# Patient Record
Sex: Male | Born: 1950 | Race: White | Hispanic: No | Marital: Married | Smoking: Never smoker
Health system: Southern US, Community
[De-identification: ages and names within clinical notes are randomized; demographics above are authoritative.]

## PROBLEM LIST (undated history)

## (undated) DIAGNOSIS — C44300 Unspecified malignant neoplasm of skin of unspecified part of face: Secondary | ICD-10-CM

## (undated) DIAGNOSIS — I10 Essential (primary) hypertension: Secondary | ICD-10-CM

## (undated) DIAGNOSIS — I5022 Chronic systolic (congestive) heart failure: Secondary | ICD-10-CM

## (undated) DIAGNOSIS — I4819 Other persistent atrial fibrillation: Secondary | ICD-10-CM

## (undated) DIAGNOSIS — F1010 Alcohol abuse, uncomplicated: Secondary | ICD-10-CM

## (undated) HISTORY — PX: TONSILLECTOMY: SUR1361

## (undated) HISTORY — PX: KNEE ARTHROSCOPY: SHX127

## (undated) HISTORY — DX: Alcohol abuse, uncomplicated: F10.10

## (undated) HISTORY — DX: Chronic systolic (congestive) heart failure: I50.22

## (undated) HISTORY — DX: Essential (primary) hypertension: I10

## (undated) HISTORY — PX: MOHS SURGERY: SUR867

## (undated) HISTORY — DX: Other persistent atrial fibrillation: I48.19

---

## 2013-12-25 LAB — HEPATIC FUNCTION PANEL: ALT: 53 U/L — AB (ref 10–40)

## 2013-12-26 LAB — LIPID PANEL
CHOLESTEROL: 203 mg/dL — AB (ref 0–200)
HDL: 73 mg/dL — AB (ref 35–70)
LDL CALC: 115 mg/dL
LDL/HDL RATIO: 1.6
TRIGLYCERIDES: 77 mg/dL (ref 40–160)

## 2013-12-26 LAB — BASIC METABOLIC PANEL
BUN: 15 mg/dL (ref 4–21)
CREATININE: 1 mg/dL (ref 0.6–1.3)
Glucose: 80 mg/dL
Potassium: 4.3 mmol/L (ref 3.4–5.3)
Sodium: 136 mmol/L — AB (ref 137–147)

## 2013-12-26 LAB — HEPATIC FUNCTION PANEL
AST: 59 U/L — AB (ref 14–40)
Alkaline Phosphatase: 84 U/L (ref 25–125)
Bilirubin, Total: 1.1 mg/dL

## 2014-12-30 DIAGNOSIS — C44310 Basal cell carcinoma of skin of unspecified parts of face: Secondary | ICD-10-CM | POA: Insufficient documentation

## 2015-01-06 DIAGNOSIS — R748 Abnormal levels of other serum enzymes: Secondary | ICD-10-CM | POA: Insufficient documentation

## 2015-01-06 DIAGNOSIS — E7800 Pure hypercholesterolemia, unspecified: Secondary | ICD-10-CM | POA: Insufficient documentation

## 2015-01-06 DIAGNOSIS — I10 Essential (primary) hypertension: Secondary | ICD-10-CM | POA: Insufficient documentation

## 2015-01-13 ENCOUNTER — Encounter: Payer: Self-pay | Admitting: Family Medicine

## 2015-01-13 ENCOUNTER — Ambulatory Visit (INDEPENDENT_AMBULATORY_CARE_PROVIDER_SITE_OTHER): Payer: Managed Care, Other (non HMO) | Admitting: Family Medicine

## 2015-01-13 VITALS — BP 114/58 | HR 76 | Temp 97.7°F | Resp 16 | Wt 187.0 lb

## 2015-01-13 DIAGNOSIS — E785 Hyperlipidemia, unspecified: Secondary | ICD-10-CM | POA: Diagnosis not present

## 2015-01-13 DIAGNOSIS — I159 Secondary hypertension, unspecified: Secondary | ICD-10-CM | POA: Diagnosis not present

## 2015-01-13 NOTE — Progress Notes (Signed)
Patient ID: Patrick Roberts, male   DOB: 1950/05/26, 64 y.o.   MRN: [id number]   Patrick Roberts  MRN: [id number] DOB: [birth year]  Subjective:  HPI   1. Secondary hypertension, unspecified Patient is a 64 year old male who presents for follow up of his hypertension.  He was last seen on 07/14/14 and at that time he was started Amlodipine 5 mg daily.  He reports that he checks his blood pressure outside of the office but does not feel confident with the machines he used.  Patient Active Problem List   Diagnosis Date Noted  . Abnormal liver enzymes 01/06/2015  . Essential (primary) hypertension 01/06/2015  . Pure hypercholesterolemia 01/06/2015    No past medical history on file.  Social History   Social History  . Marital Status: Married    Spouse Name: N/A  . Number of Children: N/A  . Years of Education: N/A   Occupational History  . Not on file.   Social History Main Topics  . Smoking status: Never Smoker   . Smokeless tobacco: Not on file  . Alcohol Use: 4.2 oz/week    7 Standard drinks or equivalent per week  . Drug Use: No  . Sexual Activity: Not on file   Other Topics Concern  . Not on file   Social History Narrative    Outpatient Prescriptions Prior to Visit  Medication Sig Dispense Refill  . amLODipine (NORVASC) 5 MG tablet Take by mouth.    Marland Kitchen aspirin 81 MG tablet Take by mouth.    . losartan (COZAAR) 100 MG tablet Take by mouth.    . Multiple Vitamins-Minerals (CENTRUM SILVER ULTRA MENS) TABS Take by mouth.    . simvastatin (ZOCOR) 10 MG tablet Take by mouth.     No facility-administered medications prior to visit.    No Known Allergies  Review of Systems  Constitutional: Negative for fever, chills and malaise/fatigue.  Respiratory: Negative for cough, hemoptysis, sputum production, shortness of breath and wheezing.   Cardiovascular: Negative for chest pain, palpitations, orthopnea and leg swelling.  Neurological: Negative for dizziness, weakness and  headaches.   Objective:  BP 114/58 mmHg  Pulse 76  Temp(Src) 97.7 F (36.5 C) (Oral)  Resp 16  Wt 187 lb (84.823 kg)  Physical Exam  Constitutional: He is oriented to person, place, and time and well-developed, well-nourished, and in no distress.  HENT:  Head: Normocephalic and atraumatic.  Right Ear: External ear normal.  Left Ear: External ear normal.  Nose: Nose normal.  Mouth/Throat: Oropharynx is clear and moist.  Eyes: Conjunctivae are normal.  Neck: Neck supple.  Cardiovascular: Normal rate, regular rhythm and normal heart sounds.   Pulmonary/Chest: Effort normal and breath sounds normal.  Abdominal: Soft.  Neurological: He is alert and oriented to person, place, and time.  Skin: Skin is warm and dry.  Psychiatric: Mood, memory, affect and judgment normal.    Assessment and Plan :  Secondary hypertension, unspecified HLD Check labs. RTC 6 months for CPE. I have done the exam and reviewed the above chart and it is accurate to the best of my knowledge.  Miguel Aschoff MD [facility] 01/13/2015 11:46 AM

## 2015-01-14 LAB — TSH: TSH: 0.848 u[IU]/mL (ref 0.450–4.500)

## 2015-01-14 LAB — LIPID PANEL WITH LDL/HDL RATIO
Cholesterol, Total: 195 mg/dL (ref 100–199)
HDL: 81 mg/dL (ref >39)
LDL Calculated: 84 mg/dL (ref 0–99)
LDl/HDL Ratio: 1 ratio units (ref 0.0–3.6)
Triglycerides: 152 mg/dL — ABNORMAL HIGH (ref 0–149)
VLDL CHOLESTEROL CAL: 30 mg/dL (ref 5–40)

## 2015-01-14 LAB — CBC WITH DIFFERENTIAL/PLATELET
Basophils Absolute: 0 10*3/uL (ref 0.0–0.2)
Basos: 1 %
EOS (ABSOLUTE): 0 10*3/uL (ref 0.0–0.4)
Eos: 1 %
Hematocrit: 41.8 % (ref 37.5–51.0)
Hemoglobin: 14.3 g/dL (ref 12.6–17.7)
IMMATURE GRANULOCYTES: 0 %
Immature Grans (Abs): 0 10*3/uL (ref 0.0–0.1)
LYMPHS ABS: 2.3 10*3/uL (ref 0.7–3.1)
Lymphs: 37 %
MCH: 32 pg (ref 26.6–33.0)
MCHC: 34.2 g/dL (ref 31.5–35.7)
MCV: 94 fL (ref 79–97)
MONOS ABS: 0.6 10*3/uL (ref 0.1–0.9)
Monocytes: 10 %
NEUTROS PCT: 51 %
Neutrophils Absolute: 3.2 10*3/uL (ref 1.4–7.0)
PLATELETS: 189 10*3/uL (ref 150–379)
RBC: 4.47 x10E6/uL (ref 4.14–5.80)
RDW: 13.6 % (ref 12.3–15.4)
WBC: 6.1 10*3/uL (ref 3.4–10.8)

## 2015-01-14 LAB — COMPREHENSIVE METABOLIC PANEL
A/G RATIO: 1.4 (ref 1.1–2.5)
ALBUMIN: 4.2 g/dL (ref 3.6–4.8)
ALT: 26 IU/L (ref 0–44)
AST: 40 IU/L (ref 0–40)
Alkaline Phosphatase: 97 IU/L (ref 39–117)
BILIRUBIN TOTAL: 0.2 mg/dL (ref 0.0–1.2)
BUN / CREAT RATIO: 11 (ref 10–22)
BUN: 9 mg/dL (ref 8–27)
CALCIUM: 9 mg/dL (ref 8.6–10.2)
CHLORIDE: 104 mmol/L (ref 97–106)
CO2: 25 mmol/L (ref 18–29)
Creatinine, Ser: 0.8 mg/dL (ref 0.76–1.27)
GFR, EST AFRICAN AMERICAN: 108 mL/min/{1.73_m2} (ref >59)
GFR, EST NON AFRICAN AMERICAN: 94 mL/min/{1.73_m2} (ref >59)
GLUCOSE: 103 mg/dL — AB (ref 65–99)
Globulin, Total: 2.9 g/dL (ref 1.5–4.5)
Potassium: 5.2 mmol/L (ref 3.5–5.2)
Sodium: 146 mmol/L — ABNORMAL HIGH (ref 136–144)
TOTAL PROTEIN: 7.1 g/dL (ref 6.0–8.5)

## 2015-01-19 ENCOUNTER — Telehealth: Payer: Self-pay | Admitting: Family Medicine

## 2015-01-19 NOTE — Telephone Encounter (Signed)
Pt returned your call. [phone]  Thanks Con Memos

## 2015-01-20 NOTE — Telephone Encounter (Signed)
Pt advised labs ok on his voicemail-aa

## 2015-01-21 ENCOUNTER — Other Ambulatory Visit: Payer: Self-pay | Admitting: Family Medicine

## 2015-04-07 ENCOUNTER — Encounter: Payer: Self-pay | Admitting: Family Medicine

## 2015-04-07 ENCOUNTER — Ambulatory Visit (INDEPENDENT_AMBULATORY_CARE_PROVIDER_SITE_OTHER): Payer: Medicare Other | Admitting: Family Medicine

## 2015-04-07 VITALS — BP 134/72 | HR 94 | Temp 98.9°F | Resp 16 | Wt 183.0 lb

## 2015-04-07 DIAGNOSIS — M25842 Other specified joint disorders, left hand: Secondary | ICD-10-CM | POA: Diagnosis not present

## 2015-04-07 DIAGNOSIS — T148 Other injury of unspecified body region: Secondary | ICD-10-CM

## 2015-04-07 DIAGNOSIS — M25442 Effusion, left hand: Secondary | ICD-10-CM | POA: Diagnosis not present

## 2015-04-07 DIAGNOSIS — M779 Enthesopathy, unspecified: Secondary | ICD-10-CM | POA: Diagnosis not present

## 2015-04-07 DIAGNOSIS — M19041 Primary osteoarthritis, right hand: Secondary | ICD-10-CM | POA: Diagnosis not present

## 2015-04-07 DIAGNOSIS — T148XXA Other injury of unspecified body region, initial encounter: Secondary | ICD-10-CM

## 2015-04-07 DIAGNOSIS — S6991XA Unspecified injury of right wrist, hand and finger(s), initial encounter: Secondary | ICD-10-CM | POA: Diagnosis not present

## 2015-04-07 NOTE — Progress Notes (Signed)
Patient ID: Patrick Roberts, male   DOB: 1950/07/05, 65 y.o.   MRN: [id number]    Subjective:  HPI Pt is here because this past week end he was working and "jammed" his right middle finger while moving boxes. Then while he was working yesterday he noticed that he picked up a box and this same finger went side ways in about a 45 degree angle in the middle joint area. He reports that he can move this finger and everything without pain but it is swollen.   Prior to Admission medications   Medication Sig Start Date End Date Taking? Authorizing Provider  amLODipine (NORVASC) 5 MG tablet Take by mouth. 07/14/14  Yes Historical Provider, MD  aspirin 81 MG tablet Take by mouth.   Yes Historical Provider, MD  losartan (COZAAR) 100 MG tablet TAKE 1 TABLET, ORAL, DAILY 01/21/15  Yes Jerrol Banana., MD  losartan (COZAAR) 100 MG tablet TAKE 1 TABLET, ORAL, DAILY 01/21/15  Yes Jerrol Banana., MD  Multiple Vitamins-Minerals (CENTRUM SILVER ULTRA MENS) TABS Take by mouth.   Yes Historical Provider, MD  simvastatin (ZOCOR) 10 MG tablet Take by mouth. 07/06/14  Yes Historical Provider, MD    Patient Active Problem List   Diagnosis Date Noted  . Abnormal liver enzymes 01/06/2015  . Essential (primary) hypertension 01/06/2015  . Pure hypercholesterolemia 01/06/2015  . Basal cell carcinoma of face 12/30/2014    History reviewed. No pertinent past medical history.  Social History   Social History  . Marital Status: Married    Spouse Name: N/A  . Number of Children: N/A  . Years of Education: N/A   Occupational History  . Not on file.   Social History Main Topics  . Smoking status: Never Smoker   . Smokeless tobacco: Not on file  . Alcohol Use: 4.2 oz/week    7 Standard drinks or equivalent per week     Comment: occasionally  . Drug Use: No  . Sexual Activity: Not on file   Other Topics Concern  . Not on file   Social History Narrative    No Known Allergies  Review of Systems    Constitutional: Negative.   HENT: Negative.   Eyes: Negative.   Respiratory: Negative.   Cardiovascular: Negative.   Gastrointestinal: Negative.   Genitourinary: Negative.   Musculoskeletal: Negative.   Skin: Negative.   Neurological: Negative.   Endo/Heme/Allergies: Negative.   Psychiatric/Behavioral: Negative.     Immunization History  Administered Date(s) Administered  . Tdap 12/25/2013   Objective:  BP 134/72 mmHg  Pulse 94  Temp(Src) 98.9 F (37.2 C) (Oral)  Resp 16  Wt 183 lb (83.008 kg)  Physical Exam  Constitutional: He is oriented to person, place, and time and well-developed, well-nourished, and in no distress.  Eyes: Conjunctivae and EOM are normal. Pupils are equal, round, and reactive to light.  Neck: Normal range of motion. Neck supple.  Cardiovascular: Normal rate, regular rhythm, normal heart sounds and intact distal pulses.   Pulmonary/Chest: Effort normal and breath sounds normal.  Musculoskeletal: Normal range of motion. He exhibits edema and tenderness.  Mild swelling of right middle finger PIP joint. I cannot elicit any laxity of the joint itself.neurovascular exam of the right hand is normal  Neurological: He is alert and oriented to person, place, and time. He has normal reflexes. Gait normal. GCS score is 15.  Skin: Skin is warm and dry.  Psychiatric: Mood, memory, affect and judgment normal.  Lab Results  Component Value Date   WBC 6.1 01/13/2015   HCT 41.8 01/13/2015   PLT 189 01/13/2015   GLUCOSE 103* 01/13/2015   CHOL 195 01/13/2015   TRIG 152* 01/13/2015   HDL 81 01/13/2015   LDLCALC 84 01/13/2015   TSH 0.848 01/13/2015    CMP     Component Value Date/Time   NA 146* 01/13/2015 1231   K 5.2 01/13/2015 1231   CL 104 01/13/2015 1231   CO2 25 01/13/2015 1231   GLUCOSE 103* 01/13/2015 1231   BUN 9 01/13/2015 1231   CREATININE 0.80 01/13/2015 1231   CREATININE 1.0 12/26/2013   CALCIUM 9.0 01/13/2015 1231   PROT 7.1 01/13/2015  1231   ALBUMIN 4.2 01/13/2015 1231   AST 40 01/13/2015 1231   ALT 26 01/13/2015 1231   ALKPHOS 97 01/13/2015 1231   BILITOT 0.2 01/13/2015 1231   GFRNONAA 94 01/13/2015 1231   GFRAA 108 01/13/2015 1231    Assessment and Plan :  1. Finger injury, right, initial encounter  - DG Finger Middle Right; Future  2. Torn ligament most likely etiology of injury patient describes In right middle finger Offered referral to orthopedics. Patient declines presently. Will split her next due index finger for a few days. X-ray to make sure is no fracture in the joint itself.  Patient was seen and examined by Dr. Miguel Aschoff, and noted scribed by Webb Laws, [place] MD [facility] 04/07/2015 2:59 PM

## 2015-04-22 DIAGNOSIS — D649 Anemia, unspecified: Secondary | ICD-10-CM | POA: Diagnosis not present

## 2015-04-30 DIAGNOSIS — D649 Anemia, unspecified: Secondary | ICD-10-CM | POA: Diagnosis not present

## 2015-04-30 DIAGNOSIS — M6281 Muscle weakness (generalized): Secondary | ICD-10-CM | POA: Diagnosis not present

## 2015-05-05 ENCOUNTER — Encounter: Payer: Self-pay | Admitting: Family Medicine

## 2015-07-10 ENCOUNTER — Other Ambulatory Visit: Payer: Self-pay | Admitting: Family Medicine

## 2015-07-13 ENCOUNTER — Encounter: Payer: Managed Care, Other (non HMO) | Admitting: Family Medicine

## 2015-07-19 ENCOUNTER — Encounter: Payer: Self-pay | Admitting: Family Medicine

## 2015-07-19 ENCOUNTER — Ambulatory Visit (INDEPENDENT_AMBULATORY_CARE_PROVIDER_SITE_OTHER): Payer: Medicare Other | Admitting: Family Medicine

## 2015-07-19 VITALS — BP 116/70 | HR 76 | Temp 97.4°F | Resp 16 | Wt 185.0 lb

## 2015-07-19 DIAGNOSIS — M7541 Impingement syndrome of right shoulder: Secondary | ICD-10-CM

## 2015-07-19 NOTE — Progress Notes (Signed)
Patient ID: Patrick Roberts, male   DOB: [birth year], 65 y.o.   MRN: [id number]    Subjective:  HPI Pt is here today to have a form filled out for a pre employment evaluation. 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 With flexion, abduction and external notation in 2/5 to 3+/5 range. He was unable to actively evaluate right shoulder in flexion or abduction above 70 degrees. Pt reports that he does not have pain in either shoulder. He does have ROM issues in the right shoulder but pain is not what restricts his shoulder. He reports that he has worked at [employer] for years and lifted heavy boxes and "slinging"  Heavy boxes with no problems.    Prior to Admission medications   Medication Sig Start Date End Date Taking? Authorizing Provider  amLODipine (NORVASC) 5 MG tablet Take by mouth. 07/14/14  Yes Historical Provider, MD  aspirin 81 MG tablet Take by mouth.   Yes Historical Provider, MD  losartan (COZAAR) 100 MG tablet TAKE 1 TABLET, ORAL, DAILY 01/21/15  Yes Jerrol Banana., MD  losartan (COZAAR) 100 MG tablet TAKE 1 TABLET, ORAL, DAILY 01/21/15  Yes Jerrol Banana., MD  Multiple Vitamins-Minerals (CENTRUM SILVER ULTRA MENS) TABS Take by mouth.   Yes Historical Provider, MD  simvastatin (ZOCOR) 10 MG tablet TAKE 1 TABLET BY MOUTH DAILY 07/13/15  Yes Jerrol Banana., MD    Patient Active Problem List   Diagnosis Date Noted  . Abnormal liver enzymes 01/06/2015  . Essential (primary) hypertension 01/06/2015  . Pure hypercholesterolemia 01/06/2015  . Basal cell carcinoma of face 12/30/2014    History reviewed. No pertinent past medical history.  Social History   Social History  . Marital Status: Married    Spouse Name: N/A  . Number of Children: N/A  . Years of Education: N/A   Occupational History  . Not on file.   Social History Main Topics  . Smoking status: Never Smoker     . Smokeless tobacco: Not on file  . Alcohol Use: 4.2 oz/week    7 Standard drinks or equivalent per week     Comment: occasionally  . Drug Use: No  . Sexual Activity: Not on file   Other Topics Concern  . Not on file   Social History Narrative    No Known Allergies  Review of Systems  Constitutional: Negative.   HENT: Negative.   Eyes: Negative.   Cardiovascular: Negative.   Gastrointestinal: Negative.   Genitourinary: Negative.   Musculoskeletal: Negative.        Decrease ROM in shoulders  Skin: Negative.   Neurological: Negative.   Endo/Heme/Allergies: Negative.   Psychiatric/Behavioral: Negative.     Immunization History  Administered Date(s) Administered  . Tdap 12/25/2013   Objective:  BP 116/70 mmHg  Pulse 76  Temp(Src) 97.4 F (36.3 C) (Oral)  Resp 16  Wt 185 lb (83.915 kg)  Physical Exam  Constitutional: He is oriented to person, place, and time and well-developed, well-nourished, and in no distress.  HENT:  Head: Normocephalic and atraumatic.  Right Ear: External ear normal.  Left Ear: External ear normal.  Eyes: Conjunctivae and EOM are normal. Pupils are equal, round, and reactive to light.  Neck: Normal range of motion. Neck supple.  Cardiovascular: Normal rate, regular rhythm, normal heart sounds and intact distal pulses.   Pulmonary/Chest: Effort normal and breath  sounds normal.  Musculoskeletal: Normal range of motion.  Neurological: He is alert and oriented to person, place, and time. He has normal reflexes. Gait normal. GCS score is 15.  Skin: Skin is warm and dry.  Psychiatric: Mood, memory, affect and judgment normal.  Patient has normal range of motion in the left shoulder for the most part but severely impaired range of motion with any abduction of the right shoulder. He is able to move his arm above his head has arm drop as he lowers his arm laterally.  Lab Results  Component Value Date   WBC 6.1 01/13/2015   HCT 41.8 01/13/2015    PLT 189 01/13/2015   GLUCOSE 103* 01/13/2015   CHOL 195 01/13/2015   TRIG 152* 01/13/2015   HDL 81 01/13/2015   LDLCALC 84 01/13/2015   TSH 0.848 01/13/2015    CMP     Component Value Date/Time   NA 146* 01/13/2015 1231   K 5.2 01/13/2015 1231   CL 104 01/13/2015 1231   CO2 25 01/13/2015 1231   GLUCOSE 103* 01/13/2015 1231   BUN 9 01/13/2015 1231   CREATININE 0.80 01/13/2015 1231   CREATININE 1.0 12/26/2013   CALCIUM 9.0 01/13/2015 1231   PROT 7.1 01/13/2015 1231   ALBUMIN 4.2 01/13/2015 1231   AST 40 01/13/2015 1231   ALT 26 01/13/2015 1231   ALKPHOS 97 01/13/2015 1231   BILITOT 0.2 01/13/2015 1231   GFRNONAA 94 01/13/2015 1231   GFRAA 108 01/13/2015 1231    Assessment and Plan :  1. Shoulder impingement syndrome, right Pt needing clearance for a physical test for pre employment. Pt is cleared to have test for company.  He has no significant pain so I think he can try to do the work that is in front of him. I do think he will have to surgically addressed this shoulder issue at some point in time. If the weight is limited with his workup think he might be able to do it. I see no reason why he cannot try. Again, I am happy to refer to orthopedics at any point in time. Patient was seen and examined by Dr. Miguel Aschoff, and noted scribed by Webb Laws, [place] MD [facility] 07/19/2015 11:08 AM

## 2015-09-08 ENCOUNTER — Ambulatory Visit (INDEPENDENT_AMBULATORY_CARE_PROVIDER_SITE_OTHER): Payer: Medicare Other | Admitting: Family Medicine

## 2015-09-08 ENCOUNTER — Encounter: Payer: Self-pay | Admitting: Family Medicine

## 2015-09-08 VITALS — BP 100/70 | HR 57 | Temp 97.9°F | Resp 16 | Wt 181.0 lb

## 2015-09-08 DIAGNOSIS — F1010 Alcohol abuse, uncomplicated: Secondary | ICD-10-CM

## 2015-09-08 DIAGNOSIS — F329 Major depressive disorder, single episode, unspecified: Secondary | ICD-10-CM

## 2015-09-08 DIAGNOSIS — F32A Depression, unspecified: Secondary | ICD-10-CM

## 2015-09-08 MED ORDER — CHLORDIAZEPOXIDE HCL 25 MG PO CAPS: 25.0000 mg | ORAL_CAPSULE | Freq: Four times a day (QID) | ORAL | 5 refills | Status: DC | PRN

## 2015-09-08 MED ORDER — THIAMINE HCL 100 MG PO TABS
100.0000 mg | ORAL_TABLET | Freq: Every day | ORAL | 5 refills | Status: DC
Start: 2015-09-08 — End: 2015-10-04

## 2015-09-08 MED ORDER — DISULFIRAM 250 MG PO TABS: 250.0000 mg | ORAL_TABLET | Freq: Every day | ORAL | 5 refills | Status: DC

## 2015-09-08 NOTE — Patient Instructions (Signed)
Started  Antabuse 200 mg daily #30 with x5 refills. Librium 25 mg every 6 hours as needed #100 x5 refills. Thiamin 100 mg daily #30 x5 refills  Follow-up office visit in 1-4 weeks

## 2015-09-08 NOTE — Progress Notes (Signed)
Patient: Patrick Roberts Male    DOB: 29-Sep-1950   65 y.o.   MRN: [id number] Visit Date: 09/08/2015  Today's Provider: Wilhemena Durie, MD   Chief Complaint  Patient presents with  . Alcohol Problem   Subjective:    HPI  Patient wants to discuss his alcohol usage. Patient states that he drinks up to 24 oz of vodka daily. It is starting  to affect him him daily. He wants to quit but is not sure that he can. He does not want to go to rehabilitation. His wife is a reformed alcoholic who is very supportive of him quitting. She is with him today. No Known Allergies Current Meds  Medication Sig  . amLODipine (NORVASC) 5 MG tablet Take by mouth.  Marland Kitchen aspirin 81 MG tablet Take by mouth.  . losartan (COZAAR) 100 MG tablet TAKE 1 TABLET, ORAL, DAILY  . simvastatin (ZOCOR) 10 MG tablet TAKE 1 TABLET BY MOUTH DAILY    Review of Systems  Constitutional: Negative for appetite change, chills and fever.  Eyes: Negative.   Respiratory: Negative for chest tightness, shortness of breath and wheezing.   Cardiovascular: Negative for chest pain and palpitations.  Gastrointestinal: Negative for abdominal pain, nausea and vomiting.  Endocrine: Negative.   Psychiatric/Behavioral: Negative for suicidal ideas.       Patient is depressed. He scores 16 on the PHQ 9    Social History  Substance Use Topics  . Smoking status: Never Smoker  . Smokeless tobacco: Not on file  . Alcohol use 4.2 oz/week    7 Standard drinks or equivalent per week     Comment: occasionally   Objective:   BP 100/70 (BP Location: Left Arm, Patient Position: Sitting, Cuff Size: Large)   Pulse (!) 57   Temp 97.9 F (36.6 C) (Oral)   Resp 16   Wt 181 lb (82.1 kg)   SpO2 96%   BMI 23.88 kg/m   Physical Exam  Constitutional: He is oriented to person, place, and time. He appears well-developed and well-nourished.  HENT:  Head: Normocephalic and atraumatic.  Right Ear: External ear normal.  Left Ear: External ear  normal.  Nose: Nose normal.  Eyes: Conjunctivae are normal.  Neck: Neck supple.  Cardiovascular: Normal rate and regular rhythm.   Pulmonary/Chest: Effort normal and breath sounds normal.  Abdominal: Soft. He exhibits no mass.  Neurological: He is alert and oriented to person, place, and time.  Skin: Skin is warm and dry.  Psychiatric: He has a normal mood and affect. His behavior is normal. Judgment and thought content normal.        Assessment & Plan:       Depression screen [facility] 2/9 09/08/2015 04/07/2015  Decreased Interest 0 0  Down, Depressed, Hopeless 0 0  PHQ - 2 Score 0 0  Altered sleeping 3 -  Tired, decreased energy 2 -  Change in appetite 2 -  Feeling bad or failure about yourself  2 -  Trouble concentrating 2 -  Moving slowly or fidgety/restless 3 -  Suicidal thoughts 2 -  PHQ-9 Score 16 -  Difficult doing work/chores Somewhat difficult -    Major depressive disorder Start Cymbalta 30 mg daily Alcoholism Start thiamine, multivitamin, Librium, and patient is agreeable with wife that he wants to try Antabuse. I'll see him back in 1-4 weeks. Labs from earlier this year were okay. More than 50% of this visit is spent in counseling. Shoulder arthropathy Harley Mccartney  Cranford Mon, MD  [facility]

## 2015-09-09 ENCOUNTER — Other Ambulatory Visit: Payer: Self-pay | Admitting: *Deleted

## 2015-09-09 MED ORDER — FOLIC ACID 1 MG PO TABS: 1.0000 mg | ORAL_TABLET | Freq: Every day | ORAL | 5 refills | Status: DC

## 2015-09-09 NOTE — Telephone Encounter (Signed)
Per Dr. Rosanna Khush added Folic acid 1 mg qd [id number] x5 refills. Patient notified.

## 2015-09-15 ENCOUNTER — Telehealth: Payer: Self-pay | Admitting: Family Medicine

## 2015-09-15 NOTE — Telephone Encounter (Signed)
There is a 10mg  dose--same instuctions.I am afraid 5 would not be adequate.

## 2015-09-15 NOTE — Telephone Encounter (Signed)
Please review-aa 

## 2015-09-15 NOTE — Telephone Encounter (Signed)
Pt stated that he feels very sleepy, dizzy, & has difficulty walking after taking chlordiazePOXIDE (LIBRIUM) 25 MG capsule. Pt stated that he has been trying to take it every 6 hours but he can't function when he feels he needs to lay down. Pt is requesting to try a lower dose like 5 mg and work up if needed. Pt stated that he spoke to someone at [facility] and was advised it comes in 5 mg, 10 mg, & 15 mg. Pt would like it sent to [facility]. Please advise. Thanks TNP

## 2015-09-16 MED ORDER — CHLORDIAZEPOXIDE HCL 10 MG PO CAPS: 10.0000 mg | ORAL_CAPSULE | Freq: Four times a day (QID) | ORAL | 0 refills | Status: DC | PRN

## 2015-09-16 NOTE — Telephone Encounter (Signed)
Pt is called to discuss medication change.  CB#[phone]/MW

## 2015-09-16 NOTE — Telephone Encounter (Signed)
Pt advised and RX called in-aa

## 2015-09-29 ENCOUNTER — Ambulatory Visit (INDEPENDENT_AMBULATORY_CARE_PROVIDER_SITE_OTHER): Payer: Medicare Other | Admitting: Family Medicine

## 2015-09-29 ENCOUNTER — Encounter: Payer: Self-pay | Admitting: Family Medicine

## 2015-09-29 VITALS — BP 124/74 | HR 96 | Temp 98.6°F | Resp 16 | Wt 198.0 lb

## 2015-09-29 DIAGNOSIS — I499 Cardiac arrhythmia, unspecified: Secondary | ICD-10-CM | POA: Diagnosis not present

## 2015-09-29 DIAGNOSIS — F1010 Alcohol abuse, uncomplicated: Secondary | ICD-10-CM | POA: Diagnosis not present

## 2015-09-29 MED ORDER — METOPROLOL SUCCINATE ER 25 MG PO TB24: 25.0000 mg | ORAL_TABLET | Freq: Every day | ORAL | 5 refills | Status: DC

## 2015-09-29 NOTE — Patient Instructions (Addendum)
Start Aspirin 325mg  daily.  Start Metoprolol Succ. 25mg  daily.

## 2015-09-29 NOTE — Progress Notes (Signed)
Patient: Patrick Roberts Male    DOB: 12-02-1950   65 y.o.   MRN: [id number] Visit Date: 09/29/2015  Today's Provider: Wilhemena Durie, MD   Chief Complaint  Patient presents with  . Alcohol Problem   Subjective:    HPI  Patient comes in today for a follow up of alcohol abuse. Patient reports that the medication that was prescribed made him feel really groggy. Patient reports that he could not tolerate the medication. He also mentions that his depression has unchanged since last OV.  Overall he is doing very well. He has not been able to quit drinking completely but has no more than 1 drink a day about 3 days a week. It sounds like it may be a large drink but still this is cutting back significantly from what he can send before. He has not gotten sick on this despite taking the Antabuse in the mornings. There is conflict in his family illnesses wife and son expect him to quit completely. He is attempting to do so.    No Known Allergies Current Meds  Medication Sig  . amLODipine (NORVASC) 5 MG tablet Take by mouth.  Marland Kitchen aspirin 81 MG tablet Take by mouth.  . folic acid (FOLVITE) 1 MG tablet Take 1 tablet (1 mg total) by mouth daily.  Marland Kitchen losartan (COZAAR) 100 MG tablet TAKE 1 TABLET, ORAL, DAILY  . Multiple Vitamins-Minerals (CENTRUM SILVER ULTRA MENS) TABS Take by mouth.  . simvastatin (ZOCOR) 10 MG tablet TAKE 1 TABLET BY MOUTH DAILY  . thiamine 100 MG tablet Take 1 tablet (100 mg total) by mouth daily.    Review of Systems  Constitutional: Negative.   Eyes: Negative.   Respiratory: Negative.   Cardiovascular: Negative.   Gastrointestinal: Negative.   Endocrine: Negative.   Musculoskeletal: Negative.   Allergic/Immunologic: Negative.   Neurological: Negative.   Hematological: Negative.   Psychiatric/Behavioral: Positive for agitation. Negative for self-injury, sleep disturbance and suicidal ideas. The patient is nervous/anxious.     Social History  Substance Use  Topics  . Smoking status: Never Smoker  . Smokeless tobacco: Not on file  . Alcohol use 4.2 oz/week    7 Standard drinks or equivalent per week     Comment: daily   Objective:   BP 124/74 (BP Location: Right Arm, Patient Position: Sitting, Cuff Size: Normal)   Pulse 96   Temp 98.6 F (37 C)   Resp 16   Wt 198 lb (89.8 kg)   BMI 26.12 kg/m   Physical Exam  Constitutional: He is oriented to person, place, and time. He appears well-developed and well-nourished.  HENT:  Head: Normocephalic and atraumatic.  Right Ear: External ear normal.  Left Ear: External ear normal.  Nose: Nose normal.  Eyes: Conjunctivae are normal.  Neck: Neck supple.  Cardiovascular: Normal rate, regular rhythm and normal heart sounds.   Mildly tachycardic  Pulmonary/Chest: Effort normal and breath sounds normal.  Abdominal: Soft.  Neurological: He is alert and oriented to person, place, and time. No cranial nerve deficit. He exhibits normal muscle tone. Coordination normal.  Skin: Skin is warm and dry.  Ruddy facial complexion  Psychiatric: He has a normal mood and affect. His behavior is normal. Judgment and thought content normal.        Assessment & Plan:     1. Irregular heart beat/Atrial fibrillation, new onset Discussed the risk of this issue. Started aspirin 81 mg daily and metoprolol 25 mg daily  just to get a heart rate down a little bit. He is only in the 110s to 130s today.  go ahead and refer to cardiology. - EKG 12-Lead - Ambulatory referral to Cardiology -Metoprolol Succinate (TOPROL-XL) 25 MG 24 hr tablet 2. Alcohol abuse Encouraged him to continue on the journey to try to quit drinking. I will see him back in 1-2 months after he is seen cardiology. He is to continue the thiamine and folic acid. 3. Hypertension May choose to stop the amlodipine in the future as he needs metoprolol for his atrial fibrillation control. More than 50% of this time is spent in counseling regarding these  issues.      I have done the exam and reviewed the above chart and it is accurate to the best of my knowledge.  Abia Monaco Cranford Mon, MD  [facility]

## 2015-09-30 ENCOUNTER — Telehealth: Payer: Self-pay | Admitting: Family Medicine

## 2015-09-30 NOTE — Telephone Encounter (Signed)
Called and advised the patient about the treatment plan regarding Metoprolol. Patient verbalized understanding.

## 2015-09-30 NOTE — Telephone Encounter (Signed)
Pt was prescribed metoprolol and would like to know what this is for.Call back # in chart is correct

## 2015-10-01 ENCOUNTER — Telehealth: Payer: Self-pay | Admitting: Family Medicine

## 2015-10-01 NOTE — Telephone Encounter (Signed)
LMTCB

## 2015-10-01 NOTE — Telephone Encounter (Signed)
Pt was in yesterday to see Dr. Rosanna Manas.  He failed to mention that his feet and ankles had been swelling lately.  He wants to know is a nurse can call him back.  His call back [phone]  Thanks, Con Memos

## 2015-10-01 NOTE — Telephone Encounter (Signed)
Spoke with pt. He will see cardiology on Monday.

## 2015-10-04 ENCOUNTER — Encounter: Payer: Self-pay | Admitting: Cardiovascular Disease

## 2015-10-04 ENCOUNTER — Ambulatory Visit (INDEPENDENT_AMBULATORY_CARE_PROVIDER_SITE_OTHER): Payer: Medicare Other | Admitting: Cardiovascular Disease

## 2015-10-04 VITALS — BP 122/78 | HR 130 | Ht 73.0 in | Wt 193.5 lb

## 2015-10-04 DIAGNOSIS — F1010 Alcohol abuse, uncomplicated: Secondary | ICD-10-CM

## 2015-10-04 DIAGNOSIS — I10 Essential (primary) hypertension: Secondary | ICD-10-CM

## 2015-10-04 DIAGNOSIS — I4891 Unspecified atrial fibrillation: Secondary | ICD-10-CM | POA: Diagnosis not present

## 2015-10-04 DIAGNOSIS — I499 Cardiac arrhythmia, unspecified: Secondary | ICD-10-CM | POA: Diagnosis not present

## 2015-10-04 DIAGNOSIS — R600 Localized edema: Secondary | ICD-10-CM | POA: Insufficient documentation

## 2015-10-04 DIAGNOSIS — I5031 Acute diastolic (congestive) heart failure: Secondary | ICD-10-CM

## 2015-10-04 MED ORDER — FUROSEMIDE 20 MG PO TABS: 20.0000 mg | ORAL_TABLET | Freq: Every day | ORAL | 11 refills | Status: DC

## 2015-10-04 MED ORDER — RIVAROXABAN 20 MG PO TABS: 20.0000 mg | ORAL_TABLET | Freq: Every day | ORAL | 6 refills | Status: DC

## 2015-10-04 MED ORDER — METOPROLOL SUCCINATE ER 25 MG PO TB24: 25.0000 mg | ORAL_TABLET | Freq: Two times a day (BID) | ORAL | 5 refills | Status: DC

## 2015-10-04 MED ORDER — DILTIAZEM HCL ER COATED BEADS 120 MG PO CP24
120.0000 mg | ORAL_CAPSULE | Freq: Every day | ORAL | 11 refills | Status: DC
Start: 2015-10-04 — End: 2015-10-12

## 2015-10-04 MED ORDER — POTASSIUM CHLORIDE ER 10 MEQ PO TBCR: 10.0000 meq | EXTENDED_RELEASE_TABLET | Freq: Every day | ORAL | 11 refills | Status: DC

## 2015-10-04 NOTE — Patient Instructions (Addendum)
Medication Instructions:   Please stop the losartan   Please start diltiazem one a day (for rate control) Take metoprolol twice a day  Take furosemide one a day with potassium  Cut back on the drinks   Please stop the aspirin Please start xarelto one a day (blood thinner)   Labwork:  No new labs  Testing/Procedures:  We will order an echocardiogram for atrial fibrillation Echocardiography is a painless test that uses sound waves to create images of your heart. It provides your doctor with information about the size and shape of your heart and how well your heart's chambers and valves are working. This procedure takes approximately one hour. There are no restrictions for this procedure.    Follow-Up: It was a pleasure seeing you in the office today. Please call us if you have new issues that need to be addressed before your next appt.  [phone]  Your physician wants you to follow-up in: 1 month.    If you need a refill on your cardiac medications before your next appointment, please call your pharmacy.    Echocardiogram An echocardiogram, or echocardiography, uses sound waves (ultrasound) to produce an image of your heart. The echocardiogram is simple, painless, obtained within a short period of time, and offers valuable information to your health care provider. The images from an echocardiogram can provide information such as:  Evidence of coronary artery disease (CAD).  Heart size.  Heart muscle function.  Heart valve function.  Aneurysm detection.  Evidence of a past heart attack.  Fluid buildup around the heart.  Heart muscle thickening.  Assess heart valve function. LET [facility] CARE PROVIDER KNOW ABOUT:  Any allergies you have.  All medicines you are taking, including vitamins, herbs, eye drops, creams, and over-the-counter medicines.  Previous problems you or members of your family have had with the use of anesthetics.  Any blood disorders  you have.  Previous surgeries you have had.  Medical conditions you have.  Possibility of pregnancy, if this applies. BEFORE THE PROCEDURE  No special preparation is needed. Eat and drink normally.  PROCEDURE   In order to produce an image of your heart, gel will be applied to your chest and a wand-like tool (transducer) will be moved over your chest. The gel will help transmit the sound waves from the transducer. The sound waves will harmlessly bounce off your heart to allow the heart images to be captured in real-time motion. These images will then be recorded.  You may need an IV to receive a medicine that improves the quality of the pictures. AFTER THE PROCEDURE You may return to your normal schedule including diet, activities, and medicines, unless your health care provider tells you otherwise.   This information is not intended to replace advice given to you by your health care provider. Make sure you discuss any questions you have with your health care provider.   Document Released: 01/28/2000 Document Revised: 02/20/2014 Document Reviewed: 10/07/2012 Elsevier Interactive Patient Education [employer].

## 2015-10-04 NOTE — Progress Notes (Signed)
Cardiology Office Note  Date:  10/04/2015   ID:  Patrick Roberts, DOB 17-Oct-1950, MRN [id number]  PCP:  Wilhemena Durie, MD   Chief Complaint  Patient presents with  . Other    Irregular heart beat, edema legs/feet and sob. Meds reviewed verbally with pt.    HPI:   Patrick Roberts is a 8 or gentleman, patient of Dr. Rosanna Choya, who presents by referral for atrial fibrillation, persistent . Notes indicate history of depression, alcohol abuse, previously taking antabuse.  Seen by Dr. Rosanna Zyree one week ago, noted to be in Atrial fib 8/18 on EKG Rate was greater than 100 bpm Started on aspirin, metoprolol and encouraged to follow-up today Reports that he was initially feeling better then started feeling worse Has noticed increasing shortness of breath, leg edema, weight gain, palpitations Feels his symptoms started approximately one week ago Unclear what precipitated his symptoms, he has been trying to cut back on his alcohol intake  He has not noticed any abdominal fullness or PND, orthopnea He has noticed increasing shortness of breath with climbing stairs which is new for him Difficulty putting on his shoes, leg swelling Wife felt it was from drinking too much Gatorade  On discussion of his alcohol, he drinks vodka martinis  EKG on today's visit shows atrial fibrillation with ventricular rate 130 bpm, T-wave abnormality V1 through V4    PMH: Alcohol abuse, depression  PSH:    Past Surgical History:  Procedure Laterality Date  . KNEE SURGERY     arthroscopic x 3  . TONSILLECTOMY      Current Outpatient Prescriptions  Medication Sig Dispense Refill  . chlordiazePOXIDE (LIBRIUM) 10 MG capsule Take 1 capsule (10 mg total) by mouth every 6 (six) hours as needed for anxiety. 100 capsule 0  . disulfiram (ANTABUSE) 250 MG tablet Take 1 tablet (250 mg total) by mouth daily. 30 tablet 5  . metoprolol succinate (TOPROL-XL) 25 MG 24 hr tablet Take 1 tablet (25 mg total) by mouth 2 (two)  times daily. 60 tablet 5  . simvastatin (ZOCOR) 10 MG tablet TAKE 1 TABLET BY MOUTH DAILY 30 tablet 6  . diltiazem (CARDIZEM CD) 120 MG 24 hr capsule Take 1 capsule (120 mg total) by mouth daily. 30 capsule 11  . furosemide (LASIX) 20 MG tablet Take 1 tablet (20 mg total) by mouth daily. 30 tablet 11  . potassium chloride (K-DUR) 10 MEQ tablet Take 1 tablet (10 mEq total) by mouth daily. 30 tablet 11  . rivaroxaban (XARELTO) 20 MG TABS tablet Take 1 tablet (20 mg total) by mouth daily with supper. 30 tablet 6   No current facility-administered medications for this visit.      Allergies:   Review of patient's allergies indicates no known allergies.   Social History:  The patient  reports that he has never smoked. He has never used smokeless tobacco. He reports that he drinks about 4.2 oz of alcohol per week . He reports that he does not use drugs.   Family History:   family history includes Dementia in his mother; Heart attack in his father; Heart disease in his father.    Review of Systems: Review of Systems  Constitutional: Positive for malaise/fatigue.  Respiratory: Positive for shortness of breath.   Cardiovascular: Positive for palpitations and leg swelling.  Gastrointestinal: Negative.   Musculoskeletal: Negative.   Neurological: Negative.   Psychiatric/Behavioral: Negative.   All other systems reviewed and are negative.    PHYSICAL EXAM: VS:  BP 122/78 (BP Location: Left Arm, Patient Position: Sitting, Cuff Size: Normal)   Pulse (!) 130   Ht 6\' 1"  (1.854 m)   Wt 193 lb 8 oz (87.8 kg)   BMI 25.53 kg/m  , BMI Body mass index is 25.53 kg/m. GEN: Well nourished, well developed, in no acute distress  HEENT: normal  Neck: no JVD, carotid bruits, or masses Cardiac: Irregularly irregular, tachycardic, no murmurs, rubs, or gallops, 1 to 2 + pitting edema bilaterally to the mid shins Respiratory:  clear to auscultation bilaterally, normal work of breathing GI: soft, nontender,  nondistended, + BS MS: no deformity or atrophy  Skin: warm and dry, no rash Neuro:  Strength and sensation are intact Psych: euthymic mood, full affect    Recent Labs: 01/13/2015: ALT 26; BUN 9; Creatinine, Ser 0.80; Platelets 189; Potassium 5.2; Sodium 146; TSH 0.848    Lipid Panel Lab Results  Component Value Date   CHOL 195 01/13/2015   HDL 81 01/13/2015   LDLCALC 84 01/13/2015   TRIG 152 (H) 01/13/2015      Wt Readings from Last 3 Encounters:  10/04/15 193 lb 8 oz (87.8 kg)  09/29/15 198 lb (89.8 kg)  09/08/15 181 lb (82.1 kg)       ASSESSMENT AND PLAN:  Atrial fibrillation, Persistent ([facility]) - Plan: EKG 12-Lead, ECHOCARDIOGRAM COMPLETE Started approximately one week ago per the patient Recommended he stop aspirin, start Xarelto 20 mg daily. We will try to restore normal sinus rhythm in one month. Discussed risk of stroke. For rate control, recommended he increase metoprolol up to 25 mg twice a day We'll start diltiazem extended release 120 mg daily To make room for diltiazem, will stop losartan Echocardiogram ordered  Alcohol abuse Recommended alcohol cessation Drinks vodka typically May have contributed to his atrial fibrillation Discussed alcohol cardiomyopathy. Echocardiogram has been ordered  Bilateral edema of lower extremity Lower extremity edema likely secondary to diastolic CHF in the setting of atrial fibrillation with RVR We will start Lasix 20 g daily with potassium 10 mEq daily Encouraged him to decrease his fluid intake  Acute diastolic CHF Significant lower extremity edema, pitting in the setting of atrial fibrillation with RVR Encouraged him to decrease his salt and fluid intake, start Lasix with potassium daily Encouraged him to call us if this does not get better   Total encounter time more than 45 minutes  Greater than 50% was spent in counseling and coordination of care with the patient   Plan is for follow-up in one month with  possible start of amiodarone or cardioversion at that time   Disposition:   F/U  1 month   Orders Placed This Encounter  Procedures  . EKG 12-Lead  . ECHOCARDIOGRAM COMPLETE     Signed, Esmond Plants, M.D., Ph.D. 10/04/2015  [place], [place]

## 2015-10-05 ENCOUNTER — Ambulatory Visit (INDEPENDENT_AMBULATORY_CARE_PROVIDER_SITE_OTHER): Payer: Medicare Other

## 2015-10-05 ENCOUNTER — Other Ambulatory Visit: Payer: Self-pay

## 2015-10-05 DIAGNOSIS — I4891 Unspecified atrial fibrillation: Secondary | ICD-10-CM

## 2015-10-05 DIAGNOSIS — I10 Essential (primary) hypertension: Secondary | ICD-10-CM

## 2015-10-05 DIAGNOSIS — I499 Cardiac arrhythmia, unspecified: Secondary | ICD-10-CM | POA: Diagnosis not present

## 2015-10-12 ENCOUNTER — Encounter: Payer: Self-pay | Admitting: Cardiovascular Disease

## 2015-10-12 ENCOUNTER — Ambulatory Visit (INDEPENDENT_AMBULATORY_CARE_PROVIDER_SITE_OTHER): Payer: Medicare Other | Admitting: Cardiovascular Disease

## 2015-10-12 VITALS — BP 150/80 | HR 135 | Ht 73.0 in | Wt 179.2 lb

## 2015-10-12 DIAGNOSIS — I5021 Acute systolic (congestive) heart failure: Secondary | ICD-10-CM

## 2015-10-12 DIAGNOSIS — I481 Persistent atrial fibrillation: Secondary | ICD-10-CM

## 2015-10-12 DIAGNOSIS — I4819 Other persistent atrial fibrillation: Secondary | ICD-10-CM | POA: Insufficient documentation

## 2015-10-12 DIAGNOSIS — I10 Essential (primary) hypertension: Secondary | ICD-10-CM | POA: Diagnosis not present

## 2015-10-12 DIAGNOSIS — F1010 Alcohol abuse, uncomplicated: Secondary | ICD-10-CM

## 2015-10-12 DIAGNOSIS — R600 Localized edema: Secondary | ICD-10-CM | POA: Diagnosis not present

## 2015-10-12 MED ORDER — DILTIAZEM HCL ER COATED BEADS 240 MG PO CP24: 240.0000 mg | ORAL_CAPSULE | Freq: Every day | ORAL | 11 refills | Status: DC

## 2015-10-12 MED ORDER — DIGOXIN 250 MCG PO TABS: 0.2500 mg | ORAL_TABLET | Freq: Every day | ORAL | 6 refills | Status: DC

## 2015-10-12 MED ORDER — FUROSEMIDE 20 MG PO TABS: 20.0000 mg | ORAL_TABLET | Freq: Every day | ORAL | 11 refills | Status: DC | PRN

## 2015-10-12 MED ORDER — POTASSIUM CHLORIDE ER 10 MEQ PO TBCR: 10.0000 meq | EXTENDED_RELEASE_TABLET | Freq: Every day | ORAL | 11 refills | Status: DC | PRN

## 2015-10-12 NOTE — Progress Notes (Signed)
Cardiology Office Note  Date:  10/12/2015   ID:  Patrick Roberts, DOB August 06, 1950, MRN [id number]  PCP:  Wilhemena Durie, MD   Chief Complaint  Patient presents with  . Other    1 month f/u echo no complaints today is feeling well. Meds reviewd verbally with pt.    HPI:  Patrick Roberts is a 65 yo gentleman,  history of depression, alcohol abuse, previously taking antabuse, previously referred by Dr. Rosanna Armon for age of fibrillation with RVR Had severe leg edema on initial evaluation Recent echocardiogram showing severely depressed ejection fraction less than 25% He presents today for follow-up of his atrial fibrillation and cardiomyopathy  In general he reports he is feeling much better He reports that he stop drinking Gatorade, weight is down 20 pounds in the past 2 weeks He has been taking Lasix with potassium daily  He has been taking diltiazem, metoprolol succinate twice a day, xarelto once a day Reports his shortness of breath is much improved He is concerned about elevated blood pressure  EKG on today's visit shows atrial fibrillation with RVR, rate 135 bpm, poor R-wave progression through the anterior precordial leads, nonspecific T wave abnormality  Other past medical history Previous  discussion of his alcohol, he drinks vodka martinis  Echocardiogram from 10/05/2015 - Left ventricle: The cavity size was normal. There was mild concentric hypertrophy. Systolic function was severely reduced.The estimated ejection fraction was in the range of 20% to 25%.  Diffuse hypokinesis. - Aorta: Aortic root dimension: 39 mm (ED). - Left atrium: The atrium was mildly dilated. - Tricuspid valve: There was mild-moderate regurgitation. - Pulmonary arteries: PA peak pressure: 45 mm Hg (S).   PMH:   has no past medical history on file.  PSH:    Past Surgical History:  Procedure Laterality Date  . KNEE SURGERY     arthroscopic x 3  . TONSILLECTOMY      Current Outpatient Prescriptions   Medication Sig Dispense Refill  . diltiazem (CARDIZEM CD) 240 MG 24 hr capsule Take 1 capsule (240 mg total) by mouth daily. 30 capsule 11  . furosemide (LASIX) 20 MG tablet Take 1 tablet (20 mg total) by mouth daily as needed. 30 tablet 11  . metoprolol succinate (TOPROL-XL) 25 MG 24 hr tablet Take 1 tablet (25 mg total) by mouth 2 (two) times daily. 60 tablet 5  . potassium chloride (K-DUR) 10 MEQ tablet Take 1 tablet (10 mEq total) by mouth daily as needed. 30 tablet 11  . rivaroxaban (XARELTO) 20 MG TABS tablet Take 1 tablet (20 mg total) by mouth daily with supper. 30 tablet 6  . digoxin (LANOXIN) 0.25 MG tablet Take 1 tablet (0.25 mg total) by mouth daily. 30 tablet 6   No current facility-administered medications for this visit.      Allergies:   Review of patient's allergies indicates no known allergies.   Social History:  The patient  reports that he has never smoked. He has never used smokeless tobacco. He reports that he drinks about 4.2 oz of alcohol per week . He reports that he does not use drugs.   Family History:   family history includes Dementia in his mother; Heart attack in his father; Heart disease in his father.    Review of Systems: Review of Systems  Constitutional: Negative.   Respiratory: Negative.   Cardiovascular: Negative.   Gastrointestinal: Negative.   Musculoskeletal: Negative.   Neurological: Negative.   Psychiatric/Behavioral: Negative.   All other systems  reviewed and are negative.    PHYSICAL EXAM: VS:  BP (!) 150/80 (BP Location: Left Arm, Patient Position: Sitting, Cuff Size: Normal)   Pulse (!) 135   Ht 6\' 1"  (1.854 m)   Wt 179 lb 4 oz (81.3 kg)   BMI 23.65 kg/m  , BMI Body mass index is 23.65 kg/m. GEN: Well nourished, well developed, in no acute distress  HEENT: normal  Neck: no JVD, carotid bruits, or masses Cardiac: RRR; no murmurs, rubs, or gallops,no edema  Respiratory:  clear to auscultation bilaterally, normal work of  breathing GI: soft, nontender, nondistended, + BS MS: no deformity or atrophy  Skin: warm and dry, no rash Neuro:  Strength and sensation are intact Psych: euthymic mood, full affect    Recent Labs: 01/13/2015: ALT 26; BUN 9; Creatinine, Ser 0.80; Platelets 189; Potassium 5.2; Sodium 146; TSH 0.848    Lipid Panel Lab Results  Component Value Date   CHOL 195 01/13/2015   HDL 81 01/13/2015   LDLCALC 84 01/13/2015   TRIG 152 (H) 01/13/2015      Wt Readings from Last 3 Encounters:  10/12/15 179 lb 4 oz (81.3 kg)  10/04/15 193 lb 8 oz (87.8 kg)  09/29/15 198 lb (89.8 kg)       ASSESSMENT AND PLAN:  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 We will try to restore normal sinus rhythm in the next month  Hypertension Increase diltiazem up to 240 mg daily Encouraged him to monitor blood pressure at home  Acute systolic CHF Appears relatively euvolemic on today's visit Severely depressed ejection fraction less than 25%  Alcohol abuse Recommended alcohol cessation Likely contributing to his symptoms of cardiomyopathy  Bilateral edema of lower extremity Leg edema has resolved, recommended he take Lasix only as needed with potassium Suggested he monitor his weight and take Lasix for 3-4 pound weight gain Recent 20 pound weight loss in the past 2 weeks  Atrial fibrillation, persistent ([facility]) Heart rate continues to run fast, recommended he start digoxin 0.25 mg daily, increase diltiazem up to 240 mg daily Continue anticoagulation Plan for pharmacologic or DCCV in 4 weeks time Stress compliance with his anticoagulation  Disposition:   F/U  2 weeks   Total encounter time more than 25 minutes  Greater than 50% was spent in counseling and coordination of care with the patient    Orders Placed This Encounter  Procedures  . EKG 12-Lead     Signed, Esmond Plants, M.D., Ph.D. 10/12/2015  [place], [place]

## 2015-10-12 NOTE — Patient Instructions (Addendum)
Medication Instructions:   Please increase diltiazem up to 240 mg daily This will help blood pressure  Please start digoxin 0.25 mg daily  Please take furosemide with potassium  only as needed for ankle swelling Maybe once or twice a week  Labwork:  No new labs needed  Testing/Procedures:  No further testing at this time   Follow-Up: It was a pleasure seeing you in the office today. Please call us if you have new issues that need to be addressed before your next appt.  [phone]  Your physician wants you to follow-up in: 2 weeks   If you need a refill on your cardiac medications before your next appointment, please call your pharmacy.

## 2015-10-13 ENCOUNTER — Telehealth: Payer: Self-pay | Admitting: *Deleted

## 2015-10-13 ENCOUNTER — Telehealth: Payer: Self-pay | Admitting: Cardiovascular Disease

## 2015-10-13 ENCOUNTER — Encounter: Payer: Self-pay | Admitting: Cardiovascular Disease

## 2015-10-13 NOTE — Telephone Encounter (Signed)
PA started by Romualdo Bolk, RN; Awaiting Approval.

## 2015-10-13 NOTE — Telephone Encounter (Signed)
Digoxin 0.25 mg tablet once daily, prior authorization submitted through Cover my meds and awaiting approval.

## 2015-10-13 NOTE — Telephone Encounter (Signed)
Pt calling stating his insurance is not covering Digoxin  Would like to know an alternative  Please advise.

## 2015-10-13 NOTE — Telephone Encounter (Signed)
This encounter was created in error - please disregard. This encounter was created in error - please disregard. This encounter was created in error - please disregard. 

## 2015-10-14 NOTE — Telephone Encounter (Signed)
Prior authorization approved for Digoxin.

## 2015-10-19 NOTE — Telephone Encounter (Signed)
Pt has been approved for Digoxin 250 mcg.  Coverage 10/13/15- 10/12/16.

## 2015-10-25 ENCOUNTER — Ambulatory Visit: Payer: Medicare Other | Attending: Family Medicine | Admitting: Physical Therapy

## 2015-10-25 ENCOUNTER — Telehealth: Payer: Self-pay | Admitting: Cardiovascular Disease

## 2015-10-25 DIAGNOSIS — M25511 Pain in right shoulder: Secondary | ICD-10-CM | POA: Insufficient documentation

## 2015-10-25 NOTE — Telephone Encounter (Signed)
Xarelto 20 mg samples placed at front desk for pick up. 

## 2015-10-25 NOTE — Therapy (Signed)
[place] MAIN [facility] SERVICES [address] [place], [place], [address] Phone: [phone]   Fax:  [phone]  Patient Details  Name: Patrick Roberts MRN: [id number] Date of Birth: [birth year] Referring Provider:  Jerrol Banana.,*  Encounter Date: 10/25/2015   PT/OT/SLP Screening Form   Time in: 8:08am    Time out: 8:43am   Complaint: R shoulder pain  Past Medical Hx: Pt's PMH includes Bil knee arthroscopic surgery; alcohol abuse; BLE swelling; HR; a-fib.  Appointment with cardiologist 11/05/15 for EKG and potential pharmacological or DCCV as appropriate. Injury Date: ~09/2013 ("summer two years ago") Pain Scale: Best: 0/10, Current: 0/10, Worst: 3/10 Patient's phone number: [phone]) [phone]  Hx (this occurrence):  Pt reports he hurt his R shoulder ~2 years ago.  Says he was moving furniture, tripped, and fell on bench onto R arm.  Continued to exercise this arm and noticed bruising surrounding R shoulder and down R arm.  Had an MRI at [place] which pt reports "there was no structural damage".  Since this pt has not been exercising R shoulder at gym.  Tried chiropractic ~1.5 years ago for 3 treatments which made it feel better but pain did not go away. Trialed heat with chiropractor which relieved pain temporarily.  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  Does not have to lift overhead and is unable to.  Notices his pain when throwing a football, showering, donning shirt, driving. Pain is relieved with Aleve (prn at end of the day).  Pt sleeps through the night, denies fever, chills, nightsweats.  ROS with no red flags.  Pt is L handed.  Pt currently not exercising.  Pt is applying for truck driving position and did not pass test for this position due to weakness in R shoulder.    Assessment:  BP: 133/96, HR 52, SpO2 98% at start of session Quick Dash: 34.1, Work module 37.5 Shoulder AROM  (L,R) in standing (all painfree): F: 29 deg  E: 55 deg IR: 59 deg ER: 45 deg Abd: 34 deg    Recommendations:    Comments:  Pt presents with chronic pain (2 years) due to injury of R shoulder resulting in functional limitations with daily and work activities.  He has had chiropractic interventions in the past (~1.5 years ago) with only temporary relief.  His R shoulder AROM is extremely limited on this date due to weakness.  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  Pt is motivated to return to [employer], acquire new job as described, and to be able to return to prior exercise routine at [employer]. Pt will benefit from skilled PT interventions to address impairments listed above and to improve QOL.  Recommending MD referral for PT evaluation and treatment.    [x]  Patient would benefit from an MD referral []  Patient would benefit from a full PT/OT/ SLP evaluation and treatment. []  No intervention recommended at this time.      Collie Siad PT, DPT 10/25/2015, 7:58 AM  [place] MAIN [facility] SERVICES [address] [place], [place], [address] Phone: [phone]   Fax:  [phone]

## 2015-10-25 NOTE — Telephone Encounter (Signed)
Patient calling the office for samples of medication:   1.  What medication and dosage are you requesting samples for?  Xarelto 20 mg   2.  Are you currently out of this medication?   Has a few days left

## 2015-11-04 DIAGNOSIS — M25511 Pain in right shoulder: Secondary | ICD-10-CM | POA: Diagnosis not present

## 2015-11-04 DIAGNOSIS — M75121 Complete rotator cuff tear or rupture of right shoulder, not specified as traumatic: Secondary | ICD-10-CM | POA: Diagnosis not present

## 2015-11-04 DIAGNOSIS — G8929 Other chronic pain: Secondary | ICD-10-CM | POA: Diagnosis not present

## 2015-11-05 ENCOUNTER — Ambulatory Visit (INDEPENDENT_AMBULATORY_CARE_PROVIDER_SITE_OTHER): Payer: Medicare Other | Admitting: Cardiovascular Disease

## 2015-11-05 ENCOUNTER — Encounter: Payer: Self-pay | Admitting: Cardiovascular Disease

## 2015-11-05 VITALS — BP 160/115 | HR 88 | Resp 20 | Ht 73.0 in | Wt 178.5 lb

## 2015-11-05 DIAGNOSIS — I5021 Acute systolic (congestive) heart failure: Secondary | ICD-10-CM

## 2015-11-05 DIAGNOSIS — I481 Persistent atrial fibrillation: Secondary | ICD-10-CM

## 2015-11-05 DIAGNOSIS — F1010 Alcohol abuse, uncomplicated: Secondary | ICD-10-CM | POA: Diagnosis not present

## 2015-11-05 DIAGNOSIS — I420 Dilated cardiomyopathy: Secondary | ICD-10-CM | POA: Diagnosis not present

## 2015-11-05 DIAGNOSIS — I4819 Other persistent atrial fibrillation: Secondary | ICD-10-CM

## 2015-11-05 MED ORDER — DIGOXIN 250 MCG PO TABS: 0.2500 mg | ORAL_TABLET | Freq: Every day | ORAL | 6 refills | Status: DC

## 2015-11-05 MED ORDER — RIVAROXABAN 20 MG PO TABS: 20.0000 mg | ORAL_TABLET | Freq: Every day | ORAL | 6 refills | Status: DC

## 2015-11-05 MED ORDER — METOPROLOL SUCCINATE ER 25 MG PO TB24: 25.0000 mg | ORAL_TABLET | Freq: Two times a day (BID) | ORAL | 5 refills | Status: DC

## 2015-11-05 MED ORDER — DILTIAZEM HCL ER COATED BEADS 240 MG PO CP24: 240.0000 mg | ORAL_CAPSULE | Freq: Every day | ORAL | 11 refills | Status: DC

## 2015-11-05 NOTE — Patient Instructions (Addendum)
Medication Instructions:   No medication changes made  Labwork:  No new labs needed  Testing/Procedures:  No further testing at this time  Please read about atrial fibrillation, cardioversion,  Go to [email or web address] (log in "cone") Search for atrial fibrillation, Search cardioversion    Follow-Up: It was a pleasure seeing you in the office today. Please call us if you have new issues that need to be addressed before your next appt.  [phone]  Your physician wants you to follow-up in: 6 months.  You will receive a reminder letter in the mail two months in advance. If you don't receive a letter, please call our office to schedule the follow-up appointment.  If you need a refill on your cardiac medications before your next appointment, please call your pharmacy.     Atrial Fibrillation Atrial fibrillation is a type of irregular or rapid heartbeat (arrhythmia). In atrial fibrillation, the heart quivers continuously in a chaotic pattern. This occurs when parts of the heart receive disorganized signals that make the heart unable to pump blood normally. This can increase the risk for stroke, heart failure, and other heart-related conditions. There are different types of atrial fibrillation, including:  Paroxysmal atrial fibrillation. This type starts suddenly, and it usually stops on its own shortly after it starts.  Persistent atrial fibrillation. This type often lasts longer than a week. It may stop on its own or with treatment.  Long-lasting persistent atrial fibrillation. This type lasts longer than 12 months.  Permanent atrial fibrillation. This type does not go away. Talk with your health care provider to learn about the type of atrial fibrillation that you have. CAUSES This condition is caused by some heart-related conditions or procedures, including:  A heart attack.  Coronary artery disease.  Heart failure.  Heart valve conditions.  High blood  pressure.  Inflammation of the sac that surrounds the heart (pericarditis).  Heart surgery.  Certain heart rhythm disorders, such as Wolf-Parkinson-White syndrome. Other causes include:  Pneumonia.  Obstructive sleep apnea.  Blockage of an artery in the lungs (pulmonary embolism, or PE).  Lung cancer.  Chronic lung disease.  Thyroid problems, especially if the thyroid is overactive (hyperthyroidism).  Caffeine.  Excessive alcohol use or illegal drug use.  Use of some medicines, including certain decongestants and diet pills. Sometimes, the cause cannot be found. RISK FACTORS This condition is more likely to develop in:  People who are older in age.  People who smoke.  People who have diabetes mellitus.  People who are overweight (obese).  Athletes who exercise vigorously. SYMPTOMS Symptoms of this condition include:  A feeling that your heart is beating rapidly or irregularly.  A feeling of discomfort or pain in your chest.  Shortness of breath.  Sudden light-headedness or weakness.  Getting tired easily during exercise. In some cases, there are no symptoms. DIAGNOSIS Your health care provider may be able to detect atrial fibrillation when taking your pulse. If detected, this condition may be diagnosed with:  An electrocardiogram (ECG).  A Holter monitor test that records your heartbeat patterns over a 24-hour period.  Transthoracic echocardiogram (TTE) to evaluate how blood flows through your heart.  Transesophageal echocardiogram (TEE) to view more detailed images of your heart.  A stress test.  Imaging tests, such as a CT scan or chest X-ray.  Blood tests. TREATMENT The main goals of treatment are to prevent blood clots from forming and to keep your heart beating at a normal rate and rhythm. The type of treatment  that you receive depends on many factors, such as your underlying medical conditions and how you feel when you are experiencing atrial  fibrillation. This condition may be treated with:  Medicine to slow down the heart rate, bring the heart's rhythm back to normal, or prevent clots from forming.  Electrical cardioversion. This is a procedure that resets your heart's rhythm by delivering a controlled, low-energy shock to the heart through your skin.  Different types of ablation, such as catheter ablation, catheter ablation with pacemaker, or surgical ablation. These procedures destroy the heart tissues that send abnormal signals. When the pacemaker is used, it is placed under your skin to help your heart beat in a regular rhythm. HOME CARE INSTRUCTIONS  Take over-the counter and prescription medicines only as told by your health care provider.  If your health care provider prescribed a blood-thinning medicine (anticoagulant), take it exactly as told. Taking too much blood-thinning medicine can cause bleeding. If you do not take enough blood-thinning medicine, you will not have the protection that you need against stroke and other problems.  Do not use tobacco products, including cigarettes, chewing tobacco, and e-cigarettes. If you need help quitting, ask your health care provider.  If you have obstructive sleep apnea, manage your condition as told by your health care provider.  Do not drink alcohol.  Do not drink beverages that contain caffeine, such as coffee, soda, and tea.  Maintain a healthy weight. Do not use diet pills unless your health care provider approves. Diet pills may make heart problems worse.  Follow diet instructions as told by your health care provider.  Exercise regularly as told by your health care provider.  Keep all follow-up visits as told by your health care provider. This is important. PREVENTION  Avoid drinking beverages that contain caffeine or alcohol.  Avoid certain medicines, especially medicines that are used for breathing problems.  Avoid certain herbs and herbal medicines, such as  those that contain ephedra or ginseng.  Do not use illegal drugs, such as cocaine and amphetamines.  Do not smoke.  Manage your high blood pressure. SEEK MEDICAL CARE IF:  You notice a change in the rate, rhythm, or strength of your heartbeat.  You are taking an anticoagulant and you notice increased bruising.  You tire more easily when you exercise or exert yourself. SEEK IMMEDIATE MEDICAL CARE IF:  You have chest pain, abdominal pain, sweating, or weakness.  You feel nauseous.  You notice blood in your vomit, bowel movement, or urine.  You have shortness of breath.  You suddenly have swollen feet and ankles.  You feel dizzy.  You have sudden weakness or numbness of the face, arm, or leg, especially on one side of the body.  You have trouble speaking, trouble understanding, or both (aphasia).  Your face or your eyelid droops on one side. These symptoms may represent a serious problem that is an emergency. Do not wait to see if the symptoms will go away. Get medical help right away. Call your local emergency services (911 in the [place] Do not drive yourself to the hospital.   This information is not intended to replace advice given to you by your health care provider. Make sure you discuss any questions you have with your health care provider.   Document Released: 01/30/2005 Document Revised: 10/21/2014 Document Reviewed: 05/27/2014 Elsevier Interactive Patient Education 2016 [employer].  Hospital doctor cardioversion is the delivery of a jolt of electricity to change the rhythm of the heart.  Sticky patches or metal paddles are placed on the chest to deliver the electricity from a device. This is done to restore a normal rhythm. A rhythm that is too fast or not regular keeps the heart from pumping well. Electrical cardioversion is done in an emergency if:   There is low or no blood pressure as a result of the heart rhythm.   Normal rhythm must be  restored as fast as possible to protect the brain and heart from further damage.   It may save a life. Cardioversion may be done for heart rhythms that are not immediately life threatening, such as atrial fibrillation or flutter, in which:   The heart is beating too fast or is not regular.   Medicine to change the rhythm has not worked.   It is safe to wait in order to allow time for preparation.  Symptoms of the abnormal rhythm are bothersome.  The risk of stroke and other serious problems can be reduced. LET [facility] CARE PROVIDER KNOW ABOUT:   Any allergies you have.  All medicines you are taking, including vitamins, herbs, eye drops, creams, and over-the-counter medicines.  Previous problems you or members of your family have had with the use of anesthetics.   Any blood disorders you have.   Previous surgeries you have had.   Medical conditions you have. RISKS AND COMPLICATIONS  Generally, this is a safe procedure. However, problems can occur and include:   Breathing problems related to the anesthetic used.  A blood clot that breaks free and travels to other parts of your body. This could cause a stroke or other problems. The risk of this is lowered by use of blood-thinning medicine (anticoagulant) prior to the procedure.  Cardiac arrest (rare). BEFORE THE PROCEDURE   You may have tests to detect blood clots in your heart and to evaluate heart function.  You may start taking anticoagulants so your blood does not clot as easily.   Medicines may be given to help stabilize your heart rate and rhythm. PROCEDURE  You will be given medicine through an IV tube to reduce discomfort and make you sleepy (sedative).   An electrical shock will be delivered. AFTER THE PROCEDURE Your heart rhythm will be watched to make sure it does not change.    This information is not intended to replace advice given to you by your health care provider. Make sure you discuss any  questions you have with your health care provider.   Document Released: 01/20/2002 Document Revised: 02/20/2014 Document Reviewed: 08/14/2012 Elsevier Interactive Patient Education [employer].

## 2015-11-05 NOTE — Progress Notes (Signed)
Cardiology Office Note  Date:  11/05/2015   ID:  Loral Demarchi, DOB 1950/10/21, MRN [id number]  PCP:  Wilhemena Durie, MD   Chief Complaint  Patient presents with  . Other    FU after meds. Fast heart rates which increased your breathing. No chest pain or swelling. Reports having more energy. Possible rotator cuff tear/surgery.    HPI:  Mr. Bubier is a 65 yo gentleman,  history of depression, alcohol abuse, previously taking antabuse, previously referred by Dr. Rosanna Amous for Atrial fibrillation with RVR Had severe leg edema on initial evaluation Previous echocardiogram showing severely depressed ejection fraction less than 25% He presents today for follow-up of his atrial fibrillation and cardiomyopathy  In general he reports he is feeling much better On his last clinic visit we increased diltiazem, He has been taking digoxin, diltiazem, metoprolol for heart rate control Tolerating anticoagulation with xarelto daily Reports taking Lasix with potassium daily as needed, has not needed this recently Weight has been stable in the past month Reports his shortness of breath is much improved, able to climb stairs without shortness of breath  Reports he was seen recently by orthopedics, blood pressure was [id number] systolic He forgot to take his medications today, blood pressure is elevated  On last office visit, heart rate was 135 bpm EKG today shows atrial fibrillation with ventricular rate 88 bpm, nonspecific ST abnormality  Other past medical history Previous  discussion of his alcohol, he drinks vodka martinis  Echocardiogram from 10/05/2015 - Left ventricle: The cavity size was normal. There was mild concentric hypertrophy. Systolic function was severely reduced.The estimated ejection fraction was in the range of 20% to 25%.  Diffuse hypokinesis. - Aorta: Aortic root dimension: 39 mm (ED). - Left atrium: The atrium was mildly dilated. - Tricuspid valve: There was mild-moderate  regurgitation. - Pulmonary arteries: PA peak pressure: 45 mm Hg (S).   PMH:   has no past medical history on file.  PSH:    Past Surgical History:  Procedure Laterality Date  . KNEE SURGERY     arthroscopic x 3  . TONSILLECTOMY      Current Outpatient Prescriptions  Medication Sig Dispense Refill  . digoxin (LANOXIN) 0.25 MG tablet Take 1 tablet (0.25 mg total) by mouth daily. 30 tablet 6  . diltiazem (CARDIZEM CD) 240 MG 24 hr capsule Take 1 capsule (240 mg total) by mouth daily. 30 capsule 11  . metoprolol succinate (TOPROL-XL) 25 MG 24 hr tablet Take 1 tablet (25 mg total) by mouth 2 (two) times daily. 60 tablet 5  . rivaroxaban (XARELTO) 20 MG TABS tablet Take 1 tablet (20 mg total) by mouth daily with supper. 30 tablet 6  . furosemide (LASIX) 20 MG tablet Take 1 tablet (20 mg total) by mouth daily as needed. (Patient not taking: Reported on 11/05/2015) 30 tablet 11  . potassium chloride (K-DUR) 10 MEQ tablet Take 1 tablet (10 mEq total) by mouth daily as needed. (Patient not taking: Reported on 11/05/2015) 30 tablet 11   No current facility-administered medications for this visit.      Allergies:   Review of patient's allergies indicates no known allergies.   Social History:  The patient  reports that he has never smoked. He has never used smokeless tobacco. He reports that he drinks about 4.2 oz of alcohol per week . He reports that he does not use drugs.   Family History:   family history includes Dementia in his mother; Heart attack in his  father; Heart disease in his father.    Review of Systems: Review of Systems  Constitutional: Negative.   Respiratory: Negative.   Cardiovascular: Negative.   Gastrointestinal: Negative.   Musculoskeletal: Negative.   Neurological: Negative.   Psychiatric/Behavioral: Negative.   All other systems reviewed and are negative.    PHYSICAL EXAM: VS:  BP (!) 160/115 (BP Location: Right Arm, Patient Position: Sitting, Cuff Size:  Normal)   Pulse 88   Resp 20   Ht 6\' 1"  (1.854 m)   Wt 178 lb 8 oz (81 kg)   BMI 23.55 kg/m  , BMI Body mass index is 23.55 kg/m. GEN: Well nourished, well developed, in no acute distress  HEENT: normal  Neck: no JVD, carotid bruits, or masses Cardiac: Irregularly irregular, no murmurs, rubs, or gallops,no edema  Respiratory:  clear to auscultation bilaterally, normal work of breathing GI: soft, nontender, nondistended, + BS MS: no deformity or atrophy  Skin: warm and dry, no rash Neuro:  Strength and sensation are intact Psych: euthymic mood, full affect    Recent Labs: 01/13/2015: ALT 26; BUN 9; Creatinine, Ser 0.80; Platelets 189; Potassium 5.2; Sodium 146; TSH 0.848    Lipid Panel Lab Results  Component Value Date   CHOL 195 01/13/2015   HDL 81 01/13/2015   LDLCALC 84 01/13/2015   TRIG 152 (H) 01/13/2015      Wt Readings from Last 3 Encounters:  11/05/15 178 lb 8 oz (81 kg)  10/12/15 179 lb 4 oz (81.3 kg)  10/04/15 193 lb 8 oz (87.8 kg)       ASSESSMENT AND PLAN:  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 Heart rate much improved on today's visit, He will read about atrial fibrillation and cardioversion with information provided Currently reports that he feels well, at his baseline  Hypertension Encouraged him to stay on his current medications, Reports improved blood pressure Did not take his medications today, blood pressure running high  Acute systolic CHF Appears relatively euvolemic on today's visit Severely depressed ejection fraction less than 25% on previous echocardiogram Again likely secondary to alcohol and tachycardia mediated cardiomyopathy Currently asymptomatic, feels that his baseline  Alcohol abuse Recommended alcohol cessation Likely contributing to his symptoms of cardiomyopathy  Bilateral edema of lower extremity Leg edema has resolved,  recommended  he take Lasix only as needed with potassium Suggested he monitor his weight and take Lasix for 3-4 pound weight gain  Atrial fibrillation, persistent (HCC) Continue anticoagulation We spent time discussing pharmacologic or DCCV Website provided to learn about the procedure, atrial fibrillation, various treatment options Stressed compliance with his anticoagulation    Total encounter time more than 25 minutes  Greater than 50% was spent in counseling and coordination of care with the patient    Signed, Esmond Plants, M.D., Ph.D. 11/05/2015  [place], [place]

## 2015-11-05 NOTE — Addendum Note (Signed)
Addended by: Dede Query R on: 11/05/2015 05:14 PM   Modules accepted: Orders

## 2015-11-09 ENCOUNTER — Telehealth: Payer: Self-pay | Admitting: Cardiovascular Disease

## 2015-11-09 NOTE — Telephone Encounter (Signed)
Pt calling asking if he can get letter stating he was out of work due to his heart condition. He thinks since he was not going to work we may be able to write him another letter like last time he states.   From these dates:  10/12/15-11/05/15  Please advise.  He would be okay with picking it up

## 2015-11-09 NOTE — Telephone Encounter (Signed)
Spoke w/ pt.  He states that he would appreciate a letter for work that he has been out b/t his office visits. Advised him that his dx of afib should not keep him from working. Advised him that I will leave letter at the front desk for him to p/u at his convenience.  He is appreciative and will call back w/ any further questions or concerns.

## 2015-11-16 ENCOUNTER — Ambulatory Visit: Payer: Medicare Other | Admitting: Cardiovascular Disease

## 2015-11-16 DIAGNOSIS — M75121 Complete rotator cuff tear or rupture of right shoulder, not specified as traumatic: Secondary | ICD-10-CM | POA: Diagnosis not present

## 2015-11-16 DIAGNOSIS — M25511 Pain in right shoulder: Secondary | ICD-10-CM | POA: Diagnosis not present

## 2015-11-16 DIAGNOSIS — S4381XA Sprain of other specified parts of right shoulder girdle, initial encounter: Secondary | ICD-10-CM | POA: Diagnosis not present

## 2015-11-16 DIAGNOSIS — Y33XXXA Other specified events, undetermined intent, initial encounter: Secondary | ICD-10-CM | POA: Diagnosis not present

## 2015-11-22 DIAGNOSIS — M6281 Muscle weakness (generalized): Secondary | ICD-10-CM | POA: Diagnosis not present

## 2015-11-29 DIAGNOSIS — M25511 Pain in right shoulder: Secondary | ICD-10-CM | POA: Diagnosis not present

## 2015-12-01 ENCOUNTER — Ambulatory Visit: Payer: Medicare Other | Admitting: Family Medicine

## 2015-12-01 DIAGNOSIS — M25511 Pain in right shoulder: Secondary | ICD-10-CM | POA: Diagnosis not present

## 2015-12-23 ENCOUNTER — Encounter: Payer: Medicare Other | Admitting: Family Medicine

## 2016-01-14 ENCOUNTER — Ambulatory Visit (INDEPENDENT_AMBULATORY_CARE_PROVIDER_SITE_OTHER): Payer: Medicare Other | Admitting: Family Medicine

## 2016-01-14 ENCOUNTER — Encounter: Payer: Self-pay | Admitting: Family Medicine

## 2016-01-14 VITALS — BP 118/74 | HR 80 | Temp 97.7°F | Resp 16 | Wt 181.0 lb

## 2016-01-14 DIAGNOSIS — R066 Hiccough: Secondary | ICD-10-CM | POA: Diagnosis not present

## 2016-01-14 MED ORDER — BACLOFEN 10 MG PO TABS: ORAL_TABLET | ORAL | 0 refills | Status: DC

## 2016-01-14 NOTE — Progress Notes (Signed)
Subjective:     Patient ID: Patrick Roberts, male   DOB: 20-Oct-1950, 65 y.o.   MRN: [id number]  HPI  Chief Complaint  Patient presents with  . Hiccups    for the last 3 days. He reports that it normally happens when he eats something. He has done research and wants to get it checked out. He has lost weight because he tries to eat and gets the hiccups  States he also woke up at 12:30 this AM with hiccups. States they last for hours. Reports alcohol consumption of two glasses of wine most days. Denies abdominal pain, change in bowel pattern, respiratory or CNS symptoms. Took a Nexium this AM and symptoms have abated. States he will be seeing his primary M.D., Patrick Roberts, on 12/4 and defers any workup until then. Has tried sugar, peanut butter and drinking out of a glass while bending down without improvement.   Review of Systems     Objective:   Physical Exam  Constitutional: He appears well-developed and well-nourished. No distress.  Cardiovascular: An irregular rhythm present.  Pulmonary/Chest: Breath sounds normal.  Abdominal: Soft. There is no tenderness.       Assessment:    1. Hiccups - baclofen (LIORESAL) 10 MG tablet; 1/2 tablet 3 x day as needed for hiccups  Dispense: 6 each; Refill: 0    Plan:    F/u as scheduled with Patrick Roberts. Initiate Nexium daily.

## 2016-01-14 NOTE — Patient Instructions (Signed)
Take Nexium daily. F/u with Dr.Gilbert as scheduled. Minimize alcohol use.

## 2016-01-17 ENCOUNTER — Ambulatory Visit (INDEPENDENT_AMBULATORY_CARE_PROVIDER_SITE_OTHER): Payer: Medicare Other | Admitting: Family Medicine

## 2016-01-17 ENCOUNTER — Encounter: Payer: Self-pay | Admitting: Family Medicine

## 2016-01-17 ENCOUNTER — Ambulatory Visit: Payer: Medicare Other | Admitting: Family Medicine

## 2016-01-17 VITALS — BP 126/82 | HR 84 | Temp 97.9°F | Resp 16 | Wt 180.0 lb

## 2016-01-17 DIAGNOSIS — Z1159 Encounter for screening for other viral diseases: Secondary | ICD-10-CM | POA: Diagnosis not present

## 2016-01-17 DIAGNOSIS — Z111 Encounter for screening for respiratory tuberculosis: Secondary | ICD-10-CM | POA: Diagnosis not present

## 2016-01-17 DIAGNOSIS — E784 Other hyperlipidemia: Secondary | ICD-10-CM

## 2016-01-17 DIAGNOSIS — R066 Hiccough: Secondary | ICD-10-CM

## 2016-01-17 DIAGNOSIS — F1010 Alcohol abuse, uncomplicated: Secondary | ICD-10-CM

## 2016-01-17 DIAGNOSIS — R3129 Other microscopic hematuria: Secondary | ICD-10-CM

## 2016-01-17 DIAGNOSIS — E7849 Other hyperlipidemia: Secondary | ICD-10-CM

## 2016-01-17 DIAGNOSIS — I499 Cardiac arrhythmia, unspecified: Secondary | ICD-10-CM

## 2016-01-17 LAB — POCT URINALYSIS DIPSTICK
BILIRUBIN UA: NEGATIVE
Glucose, UA: NEGATIVE
KETONES UA: NEGATIVE
Leukocytes, UA: NEGATIVE
Nitrite, UA: NEGATIVE
PH UA: 6
Spec Grav, UA: >=1.03
Urobilinogen, UA: 0.2

## 2016-01-17 LAB — POCT UA - MICROSCOPIC ONLY

## 2016-01-17 NOTE — Progress Notes (Signed)
Patrick Roberts  MRN: [id number] DOB: 01-15-1951  Subjective:  HPI  Patient is here for follow up. He was seen in August for irregular heart beat and was sent to cardiologist. He saw Dr Patrick Roberts. Medication adjustments were made and he was to follow up in 6 months. Patient is taking all of his medications. He is drinking 5 glasses of wine a week. Doing much better. Not having any withdrawal symptoms. 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 Patient Active Problem List   Diagnosis Date Noted  . Congestive dilated cardiomyopathy ([place]) 11/05/2015  . Atrial fibrillation, persistent ([facility]) 10/12/2015  . Alcohol abuse 10/04/2015  . Bilateral edema of lower extremity 10/04/2015  . Abnormal liver enzymes 01/06/2015  . Essential (primary) hypertension 01/06/2015  . Pure hypercholesterolemia 01/06/2015  . Basal cell carcinoma of face 12/30/2014    Past Medical History:  Diagnosis Date  . Acute systolic CHF (congestive heart failure) ([place])   . Alcohol abuse   . Cardiomyopathy ([place])   . Essential hypertension   . Persistent atrial fibrillation [facility])     Social History   Social History  . Marital status: Married    Spouse name: N/A  . Number of children: N/A  . Years of education: N/A   Occupational History  . Not on file.   Social History Main Topics  . Smoking status: Never Smoker  . Smokeless tobacco: Never Used  . Alcohol use 4.2 oz/week    7 Standard drinks or equivalent per week     Comment: daily  . Drug use: No  . Sexual activity: Not on file   Other Topics Concern  . Not on file   Social History Narrative  . No narrative on file    Outpatient Encounter Prescriptions as of 01/17/2016  Medication Sig  . baclofen (LIORESAL) 10 MG tablet 1/2 tablet 3 x day as needed for hiccups  . digoxin (LANOXIN) 0.25 MG tablet Take 1 tablet (0.25 mg total) by mouth daily.  Marland Kitchen diltiazem (CARDIZEM CD)  240 MG 24 hr capsule Take 1 capsule (240 mg total) by mouth daily.  Marland Kitchen esomeprazole (NEXIUM) 20 MG capsule Take 20 mg by mouth daily at 12 noon.  . metoprolol succinate (TOPROL-XL) 25 MG 24 hr tablet Take 1 tablet (25 mg total) by mouth 2 (two) times daily.  . rivaroxaban (XARELTO) 20 MG TABS tablet Take 1 tablet (20 mg total) by mouth daily with supper.  . [DISCONTINUED] furosemide (LASIX) 20 MG tablet Take 1 tablet (20 mg total) by mouth daily as needed. (Patient not taking: Reported on 01/14/2016)  . [DISCONTINUED] potassium chloride (K-DUR) 10 MEQ tablet Take 1 tablet (10 mEq total) by mouth daily as needed. (Patient not taking: Reported on 01/14/2016)   No facility-administered encounter medications on file as of 01/17/2016.     No Known Allergies  Review of Systems  Constitutional: Negative.   Respiratory: Negative.   Cardiovascular: Negative.   Gastrointestinal: Negative.   Musculoskeletal: Negative.   Neurological: Negative.     Objective:  BP 126/82   Pulse 84   Temp 97.9 F (36.6 C)   Resp 16   Wt 180 lb (81.6 kg)   BMI 23.75 kg/m   Physical Exam  Constitutional: He is oriented to person, place, and time and well-developed, well-nourished, and in no distress.  HENT:  Head: Normocephalic and atraumatic.  Right Ear: External ear normal.  Left Ear: External ear normal.  Nose: Nose normal.  Eyes: Conjunctivae are normal. Pupils are equal, round, and reactive to light.  Neck: Normal range of motion. Neck supple.  Cardiovascular: Normal rate, regular rhythm, normal heart sounds and intact distal pulses.   No murmur heard. Pulmonary/Chest: Effort normal and breath sounds normal. No respiratory distress. He has no wheezes.  Abdominal: Soft. He exhibits no distension. There is no tenderness. There is no rebound.  Musculoskeletal: He exhibits no edema or tenderness.  Neurological: He is alert and oriented to person, place, and time.  Skin: Skin is warm and dry.  Psychiatric:  Mood, memory, affect and judgment normal.    Assessment and Plan :  1. Irregular heart beat Resolved at this time. Followed up with Dr Patrick Roberts.  2. Alcohol abuse Better. Encouraged patient, he has made great progress with this.I am very concerned the patient will not be able to continue with moderate consumption. Encouraged him to abstain.  3. Other hyperlipidemia Routine labs done today but will hold off on lipids since last level was well. 4. Hiccups Resolved at this time. Follow as needed  5. Screening-pulmonary TB Order test for [facility] for patient. - Quantiferon tb gold assay  6. Screening for rubella Order test for [facility] for patient. - Rubella Antibody, IgM  HPI, Exam and A&P transcribed under direction and in the presence of Patrick Aschoff, MD. I have done the exam and reviewed the chart and it is accurate to the best of my knowledge. Development worker, community has been used and  any errors in dictation or transcription are unintentional. Patrick Roberts M.D. [facility]

## 2016-01-18 ENCOUNTER — Telehealth: Payer: Self-pay

## 2016-01-18 DIAGNOSIS — N289 Disorder of kidney and ureter, unspecified: Secondary | ICD-10-CM

## 2016-01-18 NOTE — Telephone Encounter (Signed)
-----   Message from Jerrol Banana., MD sent at 01/18/2016  8:20 AM EST ----- Labs okay except for significant decrease in kidney function. Push fluids and recheck renal panel in 1 week. If that is still abnormal we'll have to refer to nephrology, right now it is still safe

## 2016-01-18 NOTE — Telephone Encounter (Signed)
Pt advised, still waiting on a couple of tests to come back. Will leave a lab slip for re check for next week up front, patient [place]

## 2016-01-20 LAB — CBC WITH DIFFERENTIAL/PLATELET
BASOS ABS: 0.1 10*3/uL (ref 0.0–0.2)
Basos: 1 %
EOS (ABSOLUTE): 0 10*3/uL (ref 0.0–0.4)
Eos: 1 %
Hematocrit: 48.8 % (ref 37.5–51.0)
Hemoglobin: 16.3 g/dL (ref 13.0–17.7)
Immature Grans (Abs): 0 10*3/uL (ref 0.0–0.1)
Immature Granulocytes: 0 %
LYMPHS ABS: 3.9 10*3/uL — AB (ref 0.7–3.1)
Lymphs: 43 %
MCH: 32.4 pg (ref 26.6–33.0)
MCHC: 33.4 g/dL (ref 31.5–35.7)
MCV: 97 fL (ref 79–97)
MONOS ABS: 1 10*3/uL — AB (ref 0.1–0.9)
Monocytes: 12 %
Neutrophils Absolute: 3.8 10*3/uL (ref 1.4–7.0)
Neutrophils: 43 %
PLATELETS: 211 10*3/uL (ref 150–379)
RBC: 5.03 x10E6/uL (ref 4.14–5.80)
RDW: 15.2 % (ref 12.3–15.4)
WBC: 8.9 10*3/uL (ref 3.4–10.8)

## 2016-01-20 LAB — QUANTIFERON IN TUBE
QFT TB AG MINUS NIL VALUE: 0 IU/mL
QUANTIFERON MITOGEN VALUE: 8.53 IU/mL
QUANTIFERON NIL VALUE: 0.03 [IU]/mL
QUANTIFERON TB AG VALUE: 0.03 IU/mL
QUANTIFERON TB GOLD: NEGATIVE

## 2016-01-20 LAB — COMPREHENSIVE METABOLIC PANEL
ALK PHOS: 91 IU/L (ref 39–117)
ALT: 38 IU/L (ref 0–44)
AST: 42 IU/L — AB (ref 0–40)
Albumin/Globulin Ratio: 1.7 (ref 1.2–2.2)
Albumin: 4.6 g/dL (ref 3.6–4.8)
BUN/Creatinine Ratio: 12 (ref 10–24)
BUN: 17 mg/dL (ref 8–27)
Bilirubin Total: 0.9 mg/dL (ref 0.0–1.2)
CO2: 20 mmol/L (ref 18–29)
CREATININE: 1.45 mg/dL — AB (ref 0.76–1.27)
Calcium: 9.7 mg/dL (ref 8.6–10.2)
Chloride: 93 mmol/L — ABNORMAL LOW (ref 96–106)
GFR calc Af Amer: 58 mL/min/{1.73_m2} — ABNORMAL LOW (ref >59)
GFR calc non Af Amer: 50 mL/min/{1.73_m2} — ABNORMAL LOW (ref >59)
GLUCOSE: 102 mg/dL — AB (ref 65–99)
Globulin, Total: 2.7 g/dL (ref 1.5–4.5)
Potassium: 4.5 mmol/L (ref 3.5–5.2)
SODIUM: 135 mmol/L (ref 134–144)
Total Protein: 7.3 g/dL (ref 6.0–8.5)

## 2016-01-20 LAB — RUBELLA ANTIBODY, IGM: Rubella IgM: <20 AU/mL (ref 0.0–19.9)

## 2016-01-20 LAB — QUANTIFERON TB GOLD ASSAY (BLOOD)

## 2016-01-20 LAB — TSH: TSH: 2.4 u[IU]/mL (ref 0.450–4.500)

## 2016-01-31 ENCOUNTER — Ambulatory Visit: Payer: Medicare Other | Admitting: Family Medicine

## 2016-02-09 ENCOUNTER — Other Ambulatory Visit: Payer: Self-pay | Admitting: Family Medicine

## 2016-02-09 NOTE — Telephone Encounter (Signed)
Called into pharmacy. sd

## 2016-02-09 NOTE — Telephone Encounter (Signed)
Last ov 01/17/16. Please review. Thank you. sd

## 2016-03-22 DIAGNOSIS — C44319 Basal cell carcinoma of skin of other parts of face: Secondary | ICD-10-CM | POA: Diagnosis not present

## 2016-03-22 DIAGNOSIS — C44219 Basal cell carcinoma of skin of left ear and external auricular canal: Secondary | ICD-10-CM | POA: Diagnosis not present

## 2016-03-22 DIAGNOSIS — D485 Neoplasm of uncertain behavior of skin: Secondary | ICD-10-CM | POA: Diagnosis not present

## 2016-03-22 DIAGNOSIS — C44612 Basal cell carcinoma of skin of right upper limb, including shoulder: Secondary | ICD-10-CM | POA: Diagnosis not present

## 2016-06-07 ENCOUNTER — Other Ambulatory Visit: Payer: Self-pay | Admitting: Physician Assistant

## 2016-06-07 NOTE — Telephone Encounter (Signed)
Please review-aa 

## 2016-06-07 NOTE — Progress Notes (Signed)
Cardiology Office Note  Date:  06/08/2016   ID:  Patrick Roberts, DOB 07-21-1950, MRN [id number]  PCP:  Wilhemena Durie, MD   Chief Complaint  Patient presents with  . other    OD 36mo f/u. Needs DOT clearance/wlb. Pt states he is doing well. Reviewed meds with pt verbally.    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 wheeler He provides a form today from [place] seen yesterday in their clinic by Reece Packer nurse practitioner It indicates 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  Denies having symptoms from his tachycardia On discussion of his drinking, he knows it is not healthy and can hurt his heart We have previously strongly recommended alcohol cessation  Tolerating anticoagulation with xarelto daily No leg edema Previously  taking Lasix with potassium daily as needed, has not needed this recently Reports his weight is up over the past several months  Feels it is from food weight   In the past forgot to take his medications Reports he took him today at noon  Previous  office visit, heart rate was 135 bpm EKG today shows atrial fibrillation with ventricular rate 123 bpm, nonspecific ST abnormality  Other past medical history Previous  discussion of his alcohol, he drinks vodka martinis  Echocardiogram from 10/05/2015 - Left ventricle: The cavity size was normal. There was mild concentric  hypertrophy. Systolic function was severely reduced.The estimated ejection fraction was in the range of 20% to 25%.  Diffuse hypokinesis. - Aorta: Aortic root dimension: 39 mm (ED). - Left atrium: The atrium was mildly dilated. - Tricuspid valve: There was mild-moderate regurgitation. - Pulmonary arteries: PA peak pressure: 45 mm Hg (S).   PMH:   has a past medical history of Acute systolic CHF (congestive heart failure) ([place]); Alcohol abuse; Cardiomyopathy ([place]); Essential hypertension; and Persistent atrial fibrillation ([place]).  PSH:    Past Surgical History:  Procedure Laterality Date  . KNEE SURGERY     arthroscopic x 3  . TONSILLECTOMY      Current Outpatient Prescriptions  Medication Sig Dispense Refill  . digoxin (LANOXIN) 0.25 MG tablet Take 1 tablet (0.25 mg total) by mouth daily. 30 tablet 6  . metoprolol succinate (TOPROL-XL) 100 MG 24 hr tablet Take 1 tablet (100 mg total) by mouth 2 (two) times daily. 180 tablet 3  . rivaroxaban (XARELTO) 20 MG TABS tablet Take 1 tablet (20 mg total) by mouth daily with supper. 30 tablet 6  . sacubitril-valsartan (ENTRESTO) 49-51 MG Take 1 tablet by mouth 2 (two) times daily. 60 tablet 6   No current facility-administered medications for this visit.      Allergies:   Patient has no known allergies.   Social History:  The patient  reports that he has never smoked. He has never used smokeless tobacco. He reports that he does not drink alcohol or use drugs.   Family History:   family history includes Dementia in his mother; Heart  attack in his father; Heart disease in his father.    Review of Systems: Review of Systems  Constitutional: Negative.   Respiratory: Negative.   Cardiovascular: Negative.   Gastrointestinal: Negative.   Musculoskeletal: Negative.   Neurological: Negative.   Psychiatric/Behavioral: Negative.   All other systems reviewed and are negative.    PHYSICAL EXAM: VS:  BP (!) 148/80 (BP Location: Left Arm,  Patient Position: Sitting, Cuff Size: Normal)   Pulse (!) 123   Ht 6\' 1"  (1.854 m)   Wt 202 lb 4 oz (91.7 kg)   BMI 26.68 kg/m  , BMI Body mass index is 26.68 kg/m. GEN: Well nourished, well developed, in no acute distress  HEENT: normal  Neck: no JVD, carotid bruits, or masses Cardiac: Irregularly irregular,tachycardic,  no murmurs, rubs, or gallops,no edema  Respiratory:  clear to auscultation bilaterally, normal work of breathing GI: soft, nontender, nondistended, + BS MS: no deformity or atrophy  Skin: warm and dry, no rash Neuro:  Strength and sensation are intact Psych: euthymic mood, full affect    Recent Labs: 01/17/2016: ALT 38; BUN 17; Creatinine, Ser 1.45; Platelets 211; Potassium 4.5; Sodium 135; TSH 2.400    Lipid Panel Lab Results  Component Value Date   CHOL 195 01/13/2015   HDL 81 01/13/2015   LDLCALC 84 01/13/2015   TRIG 152 (H) 01/13/2015      Wt Readings from Last 3 Encounters:  06/08/16 202 lb 4 oz (91.7 kg)  01/17/16 180 lb (81.6 kg)  01/14/16 181 lb (82.1 kg)       ASSESSMENT AND PLAN:  He should not be cleared for DOT at this time given issues as detailed below including dilated cardiomyopathy,  alcohol abuse, atrial fibrillation with RVR He would like to drive an 18 wheeler  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 RVR on today's visit as seen on previous office visits  Suspect medication noncompliance  He previously declined cardioversion We will stop the diltiazem given his cardiomyopathy Increase metoprolol succinate 100 mg twice a day, We will also check a basic metabolic panel today,  Assuming renal function has improved back to normal (December 2017 creatinine more than 1.4), We will start entresto 49/51 mg twice a day with BMP in one month time  Hypertension Medication changes as above Stop diltiazem, increase metoprolol succinate up to 100 mg twice  a day, start entresto as above BMP today  Acute systolic CHF Likely alcohol and rate related cardiomyopathy  Severely depressed ejection fraction less than 25% on previous echocardiogram Recommended alcohol cessation. This is a long-standing issue Repeat echocardiogram in 3 months time  Alcohol abuse Recommended alcohol cessation Likely contributing to his symptoms of cardiomyopathy  Bilateral edema of lower extremity Stable at this time  Not taking Lasix Repeat basic metabolic panel today  Atrial fibrillation, persistent (HCC) Continue anticoagulation Unclear if he is compliant with his anticoagulation as he is likely noncompliant with his rate controlling medication. Currently not a candidate for cardioversion We will increase metoprolol as above, stop diltiazem Continue digoxin    Total encounter time more than 45 minutes  Greater than 50% was spent in counseling and coordination of care with the patient    Signed, Esmond Plants, M.D., Ph.D. 06/08/2016  [place], [place]

## 2016-06-08 ENCOUNTER — Other Ambulatory Visit
Admission: RE | Admit: 2016-06-08 | Discharge: 2016-06-08 | Disposition: A | Discharge status: Home / Self Care | Payer: Medicare Other | Source: Ambulatory Visit | Attending: Cardiovascular Disease | Admitting: Cardiovascular Disease

## 2016-06-08 ENCOUNTER — Ambulatory Visit (INDEPENDENT_AMBULATORY_CARE_PROVIDER_SITE_OTHER): Payer: Medicare Other | Admitting: Cardiovascular Disease

## 2016-06-08 ENCOUNTER — Encounter: Payer: Self-pay | Admitting: Cardiovascular Disease

## 2016-06-08 VITALS — BP 148/80 | HR 123 | Ht 73.0 in | Wt 202.2 lb

## 2016-06-08 DIAGNOSIS — E7800 Pure hypercholesterolemia, unspecified: Secondary | ICD-10-CM

## 2016-06-08 DIAGNOSIS — F1010 Alcohol abuse, uncomplicated: Secondary | ICD-10-CM | POA: Insufficient documentation

## 2016-06-08 DIAGNOSIS — I481 Persistent atrial fibrillation: Secondary | ICD-10-CM

## 2016-06-08 DIAGNOSIS — R600 Localized edema: Secondary | ICD-10-CM

## 2016-06-08 DIAGNOSIS — I420 Dilated cardiomyopathy: Secondary | ICD-10-CM

## 2016-06-08 DIAGNOSIS — I10 Essential (primary) hypertension: Secondary | ICD-10-CM | POA: Diagnosis not present

## 2016-06-08 DIAGNOSIS — I4819 Other persistent atrial fibrillation: Secondary | ICD-10-CM

## 2016-06-08 LAB — BASIC METABOLIC PANEL
ANION GAP: 12 (ref 5–15)
BUN: 15 mg/dL (ref 6–20)
CALCIUM: 9.2 mg/dL (ref 8.9–10.3)
CO2: 21 mmol/L — ABNORMAL LOW (ref 22–32)
CREATININE: 1.04 mg/dL (ref 0.61–1.24)
Chloride: 101 mmol/L (ref 101–111)
GFR calc Af Amer: >60 mL/min (ref >60)
GFR calc non Af Amer: >60 mL/min (ref >60)
GLUCOSE: 134 mg/dL — AB (ref 65–99)
Potassium: 4.5 mmol/L (ref 3.5–5.1)
SODIUM: 134 mmol/L — AB (ref 135–145)

## 2016-06-08 MED ORDER — SACUBITRIL-VALSARTAN 49-51 MG PO TABS: 1.0000 | ORAL_TABLET | Freq: Two times a day (BID) | ORAL | 6 refills | Status: DC

## 2016-06-08 MED ORDER — METOPROLOL SUCCINATE ER 100 MG PO TB24: 100.0000 mg | ORAL_TABLET | Freq: Two times a day (BID) | ORAL | 3 refills | Status: DC

## 2016-06-08 NOTE — Patient Instructions (Addendum)
Medication Instructions:   Please start entresto 1 pill twice a day  Please stop the diltiazem  Increase the metoprolol up to 100 mg twice a day  Stay on xarelto one a day  Stay on digoxin  Monitor your heart rate. Call us if its greater than > 100   Labwork:  We will BMP today and in 1 month.  Testing/Procedures:  Echo in 2 to 3 months   I recommend watching educational videos on topics of interest to you at:       www.goemmi.com  Enter code: HEARTCARE    Follow-Up: It was a pleasure seeing you in the office today. Please call us if you have new issues that need to be addressed before your next appt.  [phone]  Your physician wants you to follow-up in: [place].  If you need a refill on your cardiac medications before your next appointment, please call your pharmacy.    Medication Samples have been provided to the patient.  Drug name: Delene Loll       Strength: 49-51mg          Qty: 2 packs (56 tablets)  LOT: [id number]  Exp.Date: October 2018

## 2016-06-09 ENCOUNTER — Telehealth: Payer: Self-pay | Admitting: Cardiovascular Disease

## 2016-06-09 NOTE — Telephone Encounter (Signed)
Per CoverMyMeds.com, pt's Delene Loll has been approved from 06/08/16 - 06/08/17.

## 2016-06-09 NOTE — Telephone Encounter (Signed)
[employer] called, states Patrick Roberts is approved and good for a year

## 2016-06-09 NOTE — Telephone Encounter (Signed)
Spoke w/ [employer] @ [place].   Answered PA questions regarding Entresto. They will send a fax in 2-3 days w/ determination.

## 2016-06-09 NOTE — Telephone Encounter (Signed)
[employer] nees a call back regarding some questions regarding PA for Entresto/

## 2016-06-15 ENCOUNTER — Telehealth: Payer: Self-pay | Admitting: Cardiovascular Disease

## 2016-06-15 NOTE — Telephone Encounter (Signed)
How do you want me to handle this?

## 2016-06-15 NOTE — Telephone Encounter (Signed)
Spoke w/ pt.  Advised him that we cannot clear him from a cardiac standpoint to drive, as his EF was 20/25% on his last ECHO.  He is sched for upcoming labs and repeat ECHO next month. He states that cannot drive an [id number] until he is cleared and his wife asked him to make sure he was doing everything that can to get back to work.  He will continue taking Entresto as prescribed and keep his upcoming appts.

## 2016-06-15 NOTE — Telephone Encounter (Signed)
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

## 2016-06-15 NOTE — Telephone Encounter (Signed)
Left message for pt to call back  °

## 2016-06-24 DIAGNOSIS — Z23 Encounter for immunization: Secondary | ICD-10-CM | POA: Diagnosis not present

## 2016-06-29 ENCOUNTER — Telehealth: Payer: Self-pay | Admitting: Cardiovascular Disease

## 2016-06-29 NOTE — Telephone Encounter (Signed)
Patient has been treated for arrhythmia and wants to know if he can come and have an ekg to see if tx is working or he is making any progress effective.   Patient has pending job offer and needs to be able to tell if he is getting better please call .

## 2016-06-29 NOTE — Telephone Encounter (Signed)
Returned call to patient. 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 He is taking the the [facility]. He is getting a call from his job asking if he is getting better or not. He is saying as little as he can to him to the potential employer. He would like to get an EKG to see if it can tell him he is trending in the right direction. Advised that an EKG is to see what is current heart rhythm is as opposed to an echo which will show the pumping function and structure of the heart. Patient started on Entresto 06/08/16 and is eager to see if it is working. Advised that the echo is schedule in 2-3 months in order to give adequate time on medication. Patient verbalized understanding. He stated he will keep trying his potential employer that he has upcoming labs and testing. He was very Patent attorney.

## 2016-07-05 ENCOUNTER — Telehealth: Payer: Self-pay | Admitting: Cardiovascular Disease

## 2016-07-05 MED ORDER — METOPROLOL SUCCINATE ER 100 MG PO TB24: 100.0000 mg | ORAL_TABLET | Freq: Two times a day (BID) | ORAL | 3 refills | Status: DC

## 2016-07-05 NOTE — Telephone Encounter (Signed)
Pharmacy calling stating they need [place] to clarify metoprolol doses   Please call back

## 2016-07-05 NOTE — Telephone Encounter (Signed)
Notified pharmacist Arbie Cookey) the correct dose of Metoprolol is 100 mg one tablet twice a day.

## 2016-07-07 ENCOUNTER — Other Ambulatory Visit: Payer: Medicare Other

## 2016-07-07 DIAGNOSIS — I4891 Unspecified atrial fibrillation: Secondary | ICD-10-CM

## 2016-07-08 LAB — BASIC METABOLIC PANEL
BUN/Creatinine Ratio: 18 (ref 10–24)
BUN: 23 mg/dL (ref 8–27)
CO2: 19 mmol/L (ref 18–29)
CREATININE: 1.28 mg/dL — AB (ref 0.76–1.27)
Calcium: 9 mg/dL (ref 8.6–10.2)
Chloride: 99 mmol/L (ref 96–106)
GFR, EST AFRICAN AMERICAN: 67 mL/min/{1.73_m2} (ref >59)
GFR, EST NON AFRICAN AMERICAN: 58 mL/min/{1.73_m2} — AB (ref >59)
Glucose: 96 mg/dL (ref 65–99)
Potassium: 5.6 mmol/L — ABNORMAL HIGH (ref 3.5–5.2)
Sodium: 138 mmol/L (ref 134–144)

## 2016-07-11 ENCOUNTER — Telehealth: Payer: Self-pay | Admitting: Cardiovascular Disease

## 2016-07-11 ENCOUNTER — Other Ambulatory Visit: Payer: Self-pay | Admitting: *Deleted

## 2016-07-11 DIAGNOSIS — E875 Hyperkalemia: Secondary | ICD-10-CM

## 2016-07-11 DIAGNOSIS — Z79899 Other long term (current) drug therapy: Secondary | ICD-10-CM

## 2016-07-11 NOTE — Telephone Encounter (Signed)
Pt would like lab results.  

## 2016-07-11 NOTE — Telephone Encounter (Signed)
Results given. See result note. °

## 2016-07-14 ENCOUNTER — Telehealth: Payer: Self-pay | Admitting: Cardiovascular Disease

## 2016-07-14 NOTE — Telephone Encounter (Signed)
patient having an echo in June and wants to make sure he will not need to make an appt to review results.  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

## 2016-07-26 ENCOUNTER — Telehealth: Payer: Self-pay | Admitting: *Deleted

## 2016-07-26 NOTE — Telephone Encounter (Signed)
Reviewed Dr. Donivan Scull recommendations and patient is coming to have repeat labs and echocardiogram done later this month. He verbalized understanding and had no further questions.

## 2016-07-26 NOTE — Telephone Encounter (Signed)
-----   Message from Minna Merritts, MD sent at 07/25/2016 10:26 PM EDT ----- Would decrease bananas and OJ Recheck bmp  in one month

## 2016-07-31 ENCOUNTER — Other Ambulatory Visit: Payer: Self-pay | Admitting: Cardiovascular Disease

## 2016-08-08 ENCOUNTER — Other Ambulatory Visit: Payer: Self-pay

## 2016-08-08 ENCOUNTER — Ambulatory Visit (INDEPENDENT_AMBULATORY_CARE_PROVIDER_SITE_OTHER): Payer: Medicare Other

## 2016-08-08 DIAGNOSIS — E7800 Pure hypercholesterolemia, unspecified: Secondary | ICD-10-CM | POA: Diagnosis not present

## 2016-08-08 DIAGNOSIS — I420 Dilated cardiomyopathy: Secondary | ICD-10-CM

## 2016-08-08 DIAGNOSIS — R600 Localized edema: Secondary | ICD-10-CM

## 2016-08-08 DIAGNOSIS — I10 Essential (primary) hypertension: Secondary | ICD-10-CM | POA: Diagnosis not present

## 2016-08-08 DIAGNOSIS — F1010 Alcohol abuse, uncomplicated: Secondary | ICD-10-CM

## 2016-08-08 DIAGNOSIS — I4819 Other persistent atrial fibrillation: Secondary | ICD-10-CM

## 2016-08-08 DIAGNOSIS — I481 Persistent atrial fibrillation: Secondary | ICD-10-CM

## 2016-08-09 ENCOUNTER — Telehealth: Payer: Self-pay | Admitting: Cardiovascular Disease

## 2016-08-09 NOTE — Telephone Encounter (Addendum)
Patient here in the office today with form from [facility] requesting clearance for DOT. Reviewed chart and it appears that cardiac clearance was denied and that he is pending echocardiogram results. Patient did have repeat echocardiogram yesterday but results are pending Dr. Donivan Scull review. Let him know that Dr. Rockey Situ is currently out of town and would review on his return next week. He provided me with 2 copies of the form and placed 1 in red folder in "To Do" bin on Arian Murley's desk and the second one I placed in Dr. Donivan Scull "Inbox" above Mandy's desk. Will also route message to Dr. Rockey Situ for his review when he returns.

## 2016-08-09 NOTE — Telephone Encounter (Signed)
Pt would like echo results. Also states he has a DOT form he needs dr Rockey Situ to fill out.

## 2016-08-09 NOTE — Telephone Encounter (Signed)
Patient calling in wanting his echocardiogram results. Reviewed with him that these results are not available at this time and that physician has reviewed but still pending Dr. Donivan Scull review. Patient states that he also has a DOT form that needs to be filled out. Made him aware that Dr. Rockey Situ is out this week but that I would be happy to assist if possible. He states that he will bring form by for our review. He was appreciative for the call and had no further questions at this time.

## 2016-08-10 ENCOUNTER — Telehealth: Payer: Self-pay | Admitting: Cardiovascular Disease

## 2016-08-10 NOTE — Telephone Encounter (Signed)
Ok to forward echo results to his NP that previously cleared him for DOT Would call to let their office know that we will defer ETOH abuse issues to their office.

## 2016-08-10 NOTE — Telephone Encounter (Signed)
Patient calling back because he was scheduled follow up appointment and it is after his paperwork expires. He states that the DOT paperwork that he currently has will expire on 08/21/16 and he needs to know before then if we can clear him to drive. Reviewed with him that once Dr. Rockey Situ is back he can review his testing and we will be in touch with him if anything further is needed. He verbalized understanding and has no further questions.

## 2016-08-11 ENCOUNTER — Telehealth: Payer: Self-pay | Admitting: Cardiovascular Disease

## 2016-08-11 NOTE — Telephone Encounter (Signed)
Message routed to [facility].

## 2016-08-11 NOTE — Telephone Encounter (Signed)
Echocardiogram results sent to his provider and he went to see her and she reported to him that was not sufficient for clearance. He states that she needs something stating that from a cardiac standpoint he is cleared to drive. Let him know that I would make Dr. Rockey Situ aware.

## 2016-08-11 NOTE — Telephone Encounter (Signed)
Pt calling asking [place] to send Echo results to [phone]

## 2016-08-11 NOTE — Telephone Encounter (Signed)
Left voicemail message for nurse at [facility] office to call back.

## 2016-08-14 NOTE — Telephone Encounter (Signed)
Spoke with patient and reviewed that clearance has been sent over to number provided. He was appreciative for the call and has no further questions at this time.

## 2016-08-14 NOTE — Telephone Encounter (Signed)
Cardiac clearance routed to Reece Packer NP at number provided.

## 2016-08-14 NOTE — Telephone Encounter (Signed)
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

## 2016-08-14 NOTE — Telephone Encounter (Signed)
Patient still needs clearance faxed to [facility] [phone] fax she also needs results of echo

## 2016-08-15 NOTE — Telephone Encounter (Signed)
Reece Packer NP called over to review echocardiogram results and made her aware of EF and ETOH abuse. Let her know that he has had history of ETOH abuse and that they would need to manage this with him. She reports that she spoke with him about this and he reported that he had denied any use since last August so she approved him for 1 year clearance. She was appreciative for my time in reviewing results and had no further questions at this time.

## 2016-08-18 ENCOUNTER — Encounter: Payer: Self-pay | Admitting: Cardiovascular Disease

## 2016-08-18 ENCOUNTER — Ambulatory Visit (INDEPENDENT_AMBULATORY_CARE_PROVIDER_SITE_OTHER): Payer: Medicare Other | Admitting: Cardiovascular Disease

## 2016-08-18 VITALS — BP 99/72 | HR 91 | Ht 73.0 in | Wt 208.5 lb

## 2016-08-18 DIAGNOSIS — F1010 Alcohol abuse, uncomplicated: Secondary | ICD-10-CM | POA: Diagnosis not present

## 2016-08-18 DIAGNOSIS — I4819 Other persistent atrial fibrillation: Secondary | ICD-10-CM

## 2016-08-18 DIAGNOSIS — I481 Persistent atrial fibrillation: Secondary | ICD-10-CM

## 2016-08-18 DIAGNOSIS — I10 Essential (primary) hypertension: Secondary | ICD-10-CM | POA: Diagnosis not present

## 2016-08-18 DIAGNOSIS — I420 Dilated cardiomyopathy: Secondary | ICD-10-CM

## 2016-08-18 NOTE — Progress Notes (Signed)
Cardiology Office Note  Date:  08/18/2016   ID:  Patrick Roberts, DOB 1950/06/04, MRN [id number]  PCP:  Jerrol Banana., MD   Chief Complaint  Patient presents with  . OTHER    Pt has questions about medications c/o weight gain. Meds reviewed verbally with pt.    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,  Persistent atrial fibrillation with RVR severe leg edema on initial evaluation  echocardiogram August 2017 showing severely depressed ejection fraction less than 25%, (likely secondary to ETOH) He presents today for follow-up of his atrial fibrillation and cardiomyopathy  In follow-up today he reports feeling well, no complaints Tolerating his medications, denies any orthostasis symptoms Declining EKG  Sometimes noticing heart rate will be elevated 90 bpm Heart rate elevated on today's visit, on exam he is in atrial fibrillation Reports he is compliant with his anticoagulation,  xarelto Heart rate often 60s at home, suspect NSR  Reports he is not drinking alcohol Followed by [facility], Reece Packer nurse practitioner  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 There was mild concentric hypertrophy. Systolic function was severely reduced.The estimated ejection fraction was in the range of 20% to 25%.  Diffuse hypokinesis. - Aorta: Aortic root dimension: 39 mm (ED). - Left atrium: The atrium was mildly dilated. -  Tricuspid valve: There was mild-moderate regurgitation. - Pulmonary arteries: PA peak pressure: 45 mm Hg (S).   PMH:   has a past medical history of Acute systolic CHF (congestive heart failure) ([place]); Alcohol abuse; Cardiomyopathy ([place]); Essential hypertension; and Persistent atrial fibrillation ([place]).  PSH:    Past Surgical History:  Procedure Laterality Date  . KNEE SURGERY     arthroscopic x 3  . TONSILLECTOMY      Current Outpatient Prescriptions  Medication Sig Dispense Refill  . digoxin (LANOXIN) 0.25 MG tablet Take 1 tablet (0.25 mg total) by mouth daily. 30 tablet 6  . metoprolol succinate (TOPROL-XL) 100 MG 24 hr tablet Take 1 tablet (100 mg total) by mouth 2 (two) times daily. 180 tablet 3  . sacubitril-valsartan (ENTRESTO) 49-51 MG Take 1 tablet by mouth 2 (two) times daily. 60 tablet 6  . XARELTO 20 MG TABS tablet TAKE ONE TABLET BY MOUTH EVERY DAY WITH SUPPER 30 tablet 0   No current facility-administered medications for this visit.      Allergies:   Patient has no known allergies.   Social History:  The patient  reports that he has never smoked. He has never used smokeless tobacco. He reports that he does not drink alcohol or use drugs.   Family History:   family history includes Dementia in his mother; Heart attack in his father; Heart disease in his father.    Review of Systems: Review of Systems  Constitutional: Negative.   Respiratory: Negative.   Cardiovascular: Negative.   Gastrointestinal: Negative.   Musculoskeletal: Negative.   Neurological: Negative.   Psychiatric/Behavioral: Negative.   All other systems  reviewed and are negative.    PHYSICAL EXAM: VS:  BP 99/72 (BP Location: Left Arm, Patient Position: Sitting, Cuff Size: Normal)   Pulse 91   Ht 6\' 1"  (1.854 m)   Wt 208 lb 8 oz (94.6 kg)   BMI 27.51 kg/m  , BMI Body mass index is 27.51 kg/m. GEN: Well nourished, well developed, in no acute distress  HEENT: normal  Neck: no JVD,  carotid bruits, or masses Cardiac: Irregularly irregular,  no murmurs, rubs, or gallops,no edema  Respiratory:  clear to auscultation bilaterally, normal work of breathing GI: soft, nontender, nondistended, + BS MS: no deformity or atrophy  Skin: warm and dry, no rash Neuro:  Strength and sensation are intact Psych: euthymic mood, full affect    Recent Labs: 01/17/2016: ALT 38; Hemoglobin 16.3; Platelets 211; TSH 2.400 07/07/2016: BUN 23; Creatinine, Ser 1.28; Potassium 5.6; Sodium 138    Lipid Panel Lab Results  Component Value Date   CHOL 195 01/13/2015   HDL 81 01/13/2015   LDLCALC 84 01/13/2015   TRIG 152 (H) 01/13/2015      Wt Readings from Last 3 Encounters:  08/18/16 208 lb 8 oz (94.6 kg)  06/08/16 202 lb 4 oz (91.7 kg)  01/17/16 180 lb (81.6 kg)       ASSESSMENT AND PLAN:  Cardiomyopathy Repeat echocardiogram showing ejection fraction 60-65% Cardiac function has improved on current medication regiment Including digoxin, metoprolol, entresto 49/51 mg twice a day  We'll continue his current regimen  Hypertension Blood pressure borderline low but he denies any orthostasis Continue current medications  Acute systolic CHF Likely alcohol and rate related cardiomyopathy  Recommended alcohol cessation, Continue current medications  Alcohol abuse Recommended alcohol cessation Likely contributing to his previous symptoms of cardiomyopathy  Bilateral edema of lower extremity Stable , Not taking Lasix  Atrial fibrillation, persistent (HCC) Continue anticoagulation Likely having paroxysmal episodes as he reports periodic heart rates in the 60s In atrial fibrillation on today's visit, asymptomatic We'll continue current medications for rate control    Total encounter time more than 25 minutes  Greater than 50% was spent in counseling and coordination of care with the patient   Signed, Esmond Plants, M.D., Ph.D. 08/18/2016  [place], [place]

## 2016-08-18 NOTE — Patient Instructions (Addendum)
Monitor heart rate to determine your rhythm Higher rate will probably mean atrial fibrillation (>90)  Medication Instructions:   No medication changes made  Labwork:  No new labs needed  Testing/Procedures:  No further testing at this time   Follow-Up: It was a pleasure seeing you in the office today. Please call us if you have new issues that need to be addressed before your next appt.  [phone]  Your physician wants you to follow-up in: 6 months.  You will receive a reminder letter in the mail two months in advance. If you don't receive a letter, please call our office to schedule the follow-up appointment.  If you need a refill on your cardiac medications before your next appointment, please call your pharmacy.

## 2016-08-25 ENCOUNTER — Other Ambulatory Visit: Payer: Self-pay | Admitting: Cardiovascular Disease

## 2016-08-31 ENCOUNTER — Ambulatory Visit: Payer: Medicare Other | Admitting: Cardiovascular Disease

## 2016-09-21 ENCOUNTER — Other Ambulatory Visit: Payer: Self-pay | Admitting: Cardiovascular Disease

## 2016-12-29 ENCOUNTER — Other Ambulatory Visit: Payer: Self-pay | Admitting: Family Medicine

## 2016-12-29 NOTE — Telephone Encounter (Signed)
Ok to call in for Librium 10mg  q6h prn #100, r0  Patrick Roberts, Patrick Bucy, MD, MPH [facility] 12/29/2016 2:26 PM

## 2016-12-29 NOTE — Telephone Encounter (Signed)
Patient called to check on this RX refill. This refill request was just sent in this morning but patient states he is out of the medication. Can you please review for Dr Rosario Jacks, RMA

## 2016-12-29 NOTE — Telephone Encounter (Signed)
rx called in. And pt Patrick Roberts, RMA

## 2016-12-29 NOTE — Telephone Encounter (Signed)
Pt is requesting a Rx for Chlordiaze Poxide.    [facility]/  CB#[phone]/MW

## 2017-01-08 ENCOUNTER — Other Ambulatory Visit: Payer: Self-pay | Admitting: Cardiovascular Disease

## 2017-01-12 ENCOUNTER — Telehealth: Payer: Self-pay | Admitting: Cardiovascular Disease

## 2017-01-12 NOTE — Telephone Encounter (Signed)
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 He states that he has gotten a new employer and needs forms completed again. Advised that I would review forms and information that he dropped off and would then be in touch with him once completed. He was appreciative for the call and has no further questions at this time.

## 2017-01-12 NOTE — Telephone Encounter (Signed)
Patient needs DOT Clearance and copy of echo and tress test   Also he needs a list of meds dose and what they are treating   Placed note for physical requirements in nurse box  Please call when ready

## 2017-01-26 ENCOUNTER — Encounter: Payer: Self-pay | Admitting: *Deleted

## 2017-01-26 NOTE — Telephone Encounter (Signed)
Left voicemail message that DOT letter placed up front for him to pick up when possible.

## 2017-01-30 DIAGNOSIS — H2513 Age-related nuclear cataract, bilateral: Secondary | ICD-10-CM | POA: Diagnosis not present

## 2017-02-16 ENCOUNTER — Other Ambulatory Visit: Payer: Self-pay | Admitting: Cardiovascular Disease

## 2017-04-13 DIAGNOSIS — L570 Actinic keratosis: Secondary | ICD-10-CM | POA: Diagnosis not present

## 2017-04-13 DIAGNOSIS — Z85828 Personal history of other malignant neoplasm of skin: Secondary | ICD-10-CM | POA: Diagnosis not present

## 2017-04-13 DIAGNOSIS — C44319 Basal cell carcinoma of skin of other parts of face: Secondary | ICD-10-CM | POA: Diagnosis not present

## 2017-06-22 ENCOUNTER — Other Ambulatory Visit: Payer: Self-pay | Admitting: Cardiovascular Disease

## 2017-07-09 ENCOUNTER — Encounter: Payer: Self-pay | Admitting: Physician Assistant

## 2017-07-09 NOTE — Progress Notes (Signed)
Cardiology Office Note Date:  07/10/2017  Patient ID:  Patrick, Roberts Dec 14, 1950, MRN [id number] PCP:  Jerrol Banana., MD  Cardiologist:  Dr. Rockey Situ, MD    Chief Complaint: Follow-up of his nonischemic cardiomyopathy and A. fib  History of Present Illness: Patrick Roberts is a 67 y.o. male with history of persistent Afib on Xarelto, NICM felt to be secondary to alcohol abuse previously on antabuse with subsequent normalization of EF by TTE in 07/2016 as detailed below, HTN, lower extremity swelling, and depression who presents for follow-up.  Patient presented in 09/2015 with severe leg edema with Afib with RVR. Echo at that time showed an EF of 20-25%, mild concentric LVH, mild MR, mild biatrial enlargement, mild to moderate TR, PASP 45 mmHg, aortic root 39 mm, small pericardial effusion noted posterior to the heart, patient was noted to be in Afib with RVR with heart rate 120-150 bpm during the study. He has been managed on digoxin, Toprol XL, Entresto, and Xarelto. Most recent TTE from 07/2016 showed improvement in LVSF with an EF of 60-65%, moderate LVH, no RWMA, mild MR, mild biatrial enlargement, mildly dilated RV with mildly reduced RVSF, mild to moderate TR. He was most recently seen in the office in 08/2016 for follow up with a BP of 99/72, heart rate 91 bpm, weight 208 pounds.  On exam he was felt to be an A. fib (EKG not performed).  Most recent labs from 06/2016 showed a SCr 1.28, K+ 5.6.  Patient comes in doing well today.  He reports "I feel great."  He reports home blood pressure readings when checked at a local store have been approximately 117/70.  He reports full compliance with his medications.  No chest pain, syncope, dizziness, presyncope, or syncope.  No shortness of breath, diaphoresis, nausea, or vomiting.  He reports he drinks 1 glass of wine approximately every 2 to 3 days with dinner otherwise he denies any alcohol use.  No recent falls.  No BRBPR or melena.  He does  not have any issues or concerns today.   Past Medical History:  Diagnosis Date  . Alcohol abuse   . Chronic systolic (congestive) heart failure ([place])    a. TTE 8/17: EF 20-25%, mild conc LVH, mild MR, mild biatrial enlarge, mild-mod TR, PASP 45, aortic root 39 mm, small posterior pericardial effusion, Afib with RVR w/ HR 120-150 bpm; b. TTE 6/18: EF 60-65%, mod LVH, mild MR, mild biatrial enlarge, mildly dilated RV, midlly reduced RVSF, mild-mod TR  . Essential hypertension   . Persistent atrial fibrillation (HCC)    a. on Xarelto; b. CHADS2VASc 3 (CHF, HTN, age x 1)    Past Surgical History:  Procedure Laterality Date  . KNEE SURGERY     arthroscopic x 3  . TONSILLECTOMY      Current Meds  Medication Sig  . [place] 250 MCG tablet TAKE ONE TABLET BY MOUTH EVERY DAY  . ENTRESTO 49-51 MG ONE TABLET BY MOUTH TWICE DAILY  . metoprolol succinate (TOPROL-XL) 100 MG 24 hr tablet Take 1 tablet (100 mg total) by mouth 2 (two) times daily.  Alveda Reasons 20 MG TABS tablet TAKE 1 TABLET BY MOUTH DAILY WITH SUPPER    Allergies:   Patient has no known allergies.   Social History:  The patient  reports that he has never smoked. He has never used smokeless tobacco. He reports that he does not drink alcohol or use drugs.   Family History:  The  patient's family history includes Dementia in his mother; Heart attack in his father; Heart disease in his father.  ROS:   Review of Systems  Constitutional: Negative for chills, diaphoresis, fever, malaise/fatigue and weight loss.  HENT: Negative for congestion.   Eyes: Negative for discharge and redness.  Respiratory: Negative for cough, hemoptysis, sputum production, shortness of breath and wheezing.   Cardiovascular: Negative for chest pain, palpitations, orthopnea, claudication, leg swelling and PND.  Gastrointestinal: Negative for abdominal pain, blood in stool, heartburn, melena, nausea and vomiting.  Genitourinary: Negative for hematuria.    Musculoskeletal: Negative for falls and myalgias.  Skin: Negative for rash.  Neurological: Negative for dizziness, tingling, tremors, sensory change, speech change, focal weakness, loss of consciousness and weakness.  Endo/Heme/Allergies: Does not bruise/bleed easily.  Psychiatric/Behavioral: Negative for substance abuse. The patient is not nervous/anxious.   All other systems reviewed and are negative.    PHYSICAL EXAM:  VS:  BP 90/60 (BP Location: Left Arm, Patient Position: Sitting, Cuff Size: Normal)   Pulse (!) 102   Ht 6\' 1"  (1.854 m)   Wt 190 lb 5 oz (86.3 kg)   BMI 25.11 kg/m  BMI: Body mass index is 25.11 kg/m.  Physical Exam  Constitutional: He is oriented to person, place, and time. He appears well-developed and well-nourished.  HENT:  Head: Normocephalic and atraumatic.  Eyes: Right eye exhibits no discharge. Left eye exhibits no discharge.  Neck: Normal range of motion. No JVD present.  Cardiovascular: Normal rate, S1 normal, S2 normal and normal heart sounds. An irregularly irregular rhythm present. Exam reveals no distant heart sounds, no friction rub, no midsystolic click and no opening snap.  No murmur heard. Pulses:      Posterior tibial pulses are 2+ on the right side, and 2+ on the left side.  Pulmonary/Chest: Effort normal and breath sounds normal. No respiratory distress. He has no decreased breath sounds. He has no wheezes. He has no rales. He exhibits no tenderness.  Abdominal: Soft. He exhibits no distension. There is no tenderness.  Musculoskeletal: He exhibits edema.  Trace bilateral ankle edema  Neurological: He is alert and oriented to person, place, and time.  Skin: Skin is warm and dry. No cyanosis. Nails show no clubbing.  Psychiatric: He has a normal mood and affect. His speech is normal and behavior is normal. Judgment and thought content normal.     EKG:  Was ordered and interpreted by me today. Shows A. fib, 98 bpm, T wave inversion leads I,  II, III, aVF, V3, V4, V5, V6  Recent Labs: No results found for requested labs within last 8760 hours.  No results found for requested labs within last 8760 hours.   CrCl cannot be calculated (Patient's most recent lab result is older than the maximum 21 days allowed.).   Wt Readings from Last 3 Encounters:  07/10/17 190 lb 5 oz (86.3 kg)  08/18/16 208 lb 8 oz (94.6 kg)  06/08/16 202 lb 4 oz (91.7 kg)     Other studies reviewed: Additional studies/records reviewed today include: summarized above  ASSESSMENT AND PLAN:  1. Chronic systolic CHF secondary to NICM: New York Heart Association class I.  He does not appear grossly volume overloaded at this time.  Decrease Entresto to 24/26 mg twice daily in the setting of his relative hypotension.  Continue Toprol-XL 100 mg twice daily.  Not on standing diuretic therapy.  CHF education provided.  2. Chronic A. Fib: Ventricular rates are reasonably controlled in  the 90s bpm.  Continue Toprol-XL 100 mg twice daily for rate control as well as digoxin 0.25 mg daily.  We will check a digoxin level today.  Ideally, would like to taper off digoxin use though will defer this to his primary cardiologist.  Continue Xarelto for full dose anticoagulation.  Check CBC and BMP.  3. Abnormal EKG: Patient is completely asymptomatic.  He does have new T wave inversion along inferior and anterior lateral leads.  Check echocardiogram to evaluate LV systolic function, wall motion, valvular function, and right-sided pressure.  If echo demonstrates newly reduced EF he may require ischemic evaluation.  4. Chronic lower extremity swelling: Chronic, patient reports this is stable.  Likely some component of venous insufficiency.  Consider compression stockings as well as leg elevation.  5. Hypertension: Blood pressure is soft today at 90/60.  Decrease Entresto to 24/26 mg twice daily.  Hesitant to decrease Toprol given his high-normal heart rate today.  6. Alcohol abuse:  Patient reports she only drinks 1 glass of wine every 2 to 3 days with dinner.  He denies any other alcohol use.  Offered the patient Alcoholics Anonymous, he declined.  This was likely playing a significant role in his cardiomyopathy.  Per PCP.  7. High risk medication use: Check digoxin level as well as renal function today.  Would prefer to discontinue digoxin though will defer this to the patient's primary cardiologist.  Disposition: F/u with Dr. Rockey Situ in 6 months  Current medicines are reviewed at length with the patient today.  The patient did not have any concerns regarding medicines.  Signed, Christell Faith, PA-C 07/10/2017 3:07 PM     [facility] [address] [place], [place] [address] [phone]

## 2017-07-10 ENCOUNTER — Encounter

## 2017-07-10 ENCOUNTER — Ambulatory Visit: Payer: Medicare Other | Admitting: Physician Assistant

## 2017-07-10 ENCOUNTER — Encounter: Payer: Self-pay | Admitting: Physician Assistant

## 2017-07-10 VITALS — BP 90/60 | HR 102 | Ht 73.0 in | Wt 190.3 lb

## 2017-07-10 DIAGNOSIS — F1010 Alcohol abuse, uncomplicated: Secondary | ICD-10-CM | POA: Diagnosis not present

## 2017-07-10 DIAGNOSIS — I5022 Chronic systolic (congestive) heart failure: Secondary | ICD-10-CM

## 2017-07-10 DIAGNOSIS — R600 Localized edema: Secondary | ICD-10-CM

## 2017-07-10 DIAGNOSIS — I428 Other cardiomyopathies: Secondary | ICD-10-CM

## 2017-07-10 DIAGNOSIS — R9431 Abnormal electrocardiogram [ECG] [EKG]: Secondary | ICD-10-CM | POA: Diagnosis not present

## 2017-07-10 DIAGNOSIS — I10 Essential (primary) hypertension: Secondary | ICD-10-CM

## 2017-07-10 DIAGNOSIS — I482 Chronic atrial fibrillation, unspecified: Secondary | ICD-10-CM

## 2017-07-10 DIAGNOSIS — I481 Persistent atrial fibrillation: Secondary | ICD-10-CM | POA: Diagnosis not present

## 2017-07-10 MED ORDER — SACUBITRIL-VALSARTAN 24-26 MG PO TABS: 1.0000 | ORAL_TABLET | Freq: Two times a day (BID) | ORAL | 3 refills | Status: DC

## 2017-07-10 NOTE — Patient Instructions (Addendum)
Medication Instructions:  Your physician has recommended you make the following change in your medication:  DECREASE Entresto 24-26 mg by mouth two times a day.   Labwork: Your physician recommends that you return for lab work in: TODAY (CBC, BMET, DIGOXIN.)   Testing/Procedures: Your physician has requested that you have an echocardiogram. Echocardiography is a painless test that uses sound waves to create images of your heart. It provides your doctor with information about the size and shape of your heart and how well your heart's chambers and valves are working. This procedure takes approximately one hour. There are no restrictions for this procedure. You may get an IV, if needed, to receive an ultrasound enhancing agent through to better visualize your heart.    Follow-Up: Your physician wants you to follow-up in: [place]. You will receive a reminder letter in the mail two months in advance. If you don't receive a letter, please call our office to schedule the follow-up appointment.  If you need a refill on your cardiac medications before your next appointment, please call your pharmacy.

## 2017-07-11 LAB — CBC WITH DIFFERENTIAL/PLATELET
Basophils Absolute: 0.1 10*3/uL (ref 0.0–0.2)
Basos: 1 %
EOS (ABSOLUTE): 0.1 10*3/uL (ref 0.0–0.4)
EOS: 1 %
HEMATOCRIT: 42 % (ref 37.5–51.0)
Hemoglobin: 14.3 g/dL (ref 13.0–17.7)
Immature Grans (Abs): 0.1 10*3/uL (ref 0.0–0.1)
Immature Granulocytes: 1 %
LYMPHS ABS: 2.5 10*3/uL (ref 0.7–3.1)
Lymphs: 34 %
MCH: 33.6 pg — ABNORMAL HIGH (ref 26.6–33.0)
MCHC: 34 g/dL (ref 31.5–35.7)
MCV: 99 fL — ABNORMAL HIGH (ref 79–97)
MONOS ABS: 0.8 10*3/uL (ref 0.1–0.9)
Monocytes: 11 %
Neutrophils Absolute: 4 10*3/uL (ref 1.4–7.0)
Neutrophils: 52 %
Platelets: 106 10*3/uL — ABNORMAL LOW (ref 150–450)
RBC: 4.25 x10E6/uL (ref 4.14–5.80)
RDW: 16.1 % — AB (ref 12.3–15.4)
WBC: 7.5 10*3/uL (ref 3.4–10.8)

## 2017-07-11 LAB — BASIC METABOLIC PANEL
BUN / CREAT RATIO: 8 — AB (ref 10–24)
BUN: 8 mg/dL (ref 8–27)
CHLORIDE: 100 mmol/L (ref 96–106)
CO2: 19 mmol/L — ABNORMAL LOW (ref 20–29)
CREATININE: 1.04 mg/dL (ref 0.76–1.27)
Calcium: 7.4 mg/dL — ABNORMAL LOW (ref 8.6–10.2)
GFR calc Af Amer: 85 mL/min/{1.73_m2} (ref >59)
GFR calc non Af Amer: 74 mL/min/{1.73_m2} (ref >59)
GLUCOSE: 124 mg/dL — AB (ref 65–99)
Potassium: 3.4 mmol/L — ABNORMAL LOW (ref 3.5–5.2)
Sodium: 143 mmol/L (ref 134–144)

## 2017-07-11 LAB — DIGOXIN LEVEL: Digoxin, Serum: 1.7 ng/mL — ABNORMAL HIGH (ref 0.5–0.9)

## 2017-07-13 ENCOUNTER — Telehealth (HOSPITAL_COMMUNITY): Payer: Self-pay | Admitting: *Deleted

## 2017-07-13 ENCOUNTER — Other Ambulatory Visit: Payer: Self-pay | Admitting: *Deleted

## 2017-07-13 DIAGNOSIS — R7889 Finding of other specified substances, not normally found in blood: Secondary | ICD-10-CM

## 2017-07-13 DIAGNOSIS — Z79899 Other long term (current) drug therapy: Secondary | ICD-10-CM

## 2017-07-13 DIAGNOSIS — I4891 Unspecified atrial fibrillation: Secondary | ICD-10-CM

## 2017-07-13 MED ORDER — POTASSIUM CHLORIDE ER 10 MEQ PO TBCR: 10.0000 meq | EXTENDED_RELEASE_TABLET | Freq: Every day | ORAL | 2 refills | Status: DC

## 2017-07-13 NOTE — Telephone Encounter (Signed)
LMOM for pt to clbk to sched appt

## 2017-07-13 NOTE — Telephone Encounter (Signed)
-----   Message from Vanessa Ralphs, RN sent at 07/13/2017 10:03 AM EDT ----- Regarding: Referral to a. fib clinic Hi Stacy,  This patient is being referred to A. Fib clinic. He is aware and willing to come to [place]. He's been unsuccessful on Digoxin. He is aware someone will be calling to schedule.  Thanks, [employer]

## 2017-07-16 ENCOUNTER — Other Ambulatory Visit: Payer: Self-pay

## 2017-07-16 ENCOUNTER — Ambulatory Visit (INDEPENDENT_AMBULATORY_CARE_PROVIDER_SITE_OTHER): Payer: Medicare Other

## 2017-07-16 DIAGNOSIS — I482 Chronic atrial fibrillation, unspecified: Secondary | ICD-10-CM

## 2017-07-16 DIAGNOSIS — I5022 Chronic systolic (congestive) heart failure: Secondary | ICD-10-CM

## 2017-07-17 DIAGNOSIS — Z85828 Personal history of other malignant neoplasm of skin: Secondary | ICD-10-CM | POA: Diagnosis not present

## 2017-07-17 DIAGNOSIS — C44319 Basal cell carcinoma of skin of other parts of face: Secondary | ICD-10-CM | POA: Diagnosis not present

## 2017-07-20 ENCOUNTER — Other Ambulatory Visit: Payer: Medicare Other

## 2017-07-26 ENCOUNTER — Encounter (HOSPITAL_COMMUNITY): Payer: Self-pay | Admitting: Nurse Practitioner

## 2017-07-26 ENCOUNTER — Other Ambulatory Visit: Payer: Self-pay

## 2017-07-26 ENCOUNTER — Encounter (HOSPITAL_COMMUNITY): Payer: Self-pay | Admitting: Emergency Medicine

## 2017-07-26 ENCOUNTER — Observation Stay (HOSPITAL_COMMUNITY)
Admission: EM | Admit: 2017-07-26 | Discharge: 2017-07-27 | Disposition: A | Discharge status: Home / Self Care | Payer: Medicare Other | Attending: Cardiology | Admitting: Cardiology

## 2017-07-26 ENCOUNTER — Emergency Department (HOSPITAL_COMMUNITY): Payer: Medicare Other

## 2017-07-26 ENCOUNTER — Ambulatory Visit (HOSPITAL_BASED_OUTPATIENT_CLINIC_OR_DEPARTMENT_OTHER)
Admission: RE | Admit: 2017-07-26 | Discharge: 2017-07-26 | Disposition: A | Discharge status: Home / Self Care | Payer: Medicare Other | Source: Ambulatory Visit | Attending: Nurse Practitioner | Admitting: Nurse Practitioner

## 2017-07-26 VITALS — BP 88/62 | HR 164 | Ht 73.0 in | Wt 186.0 lb

## 2017-07-26 DIAGNOSIS — I428 Other cardiomyopathies: Secondary | ICD-10-CM | POA: Diagnosis not present

## 2017-07-26 DIAGNOSIS — I4891 Unspecified atrial fibrillation: Secondary | ICD-10-CM | POA: Diagnosis not present

## 2017-07-26 DIAGNOSIS — I5022 Chronic systolic (congestive) heart failure: Secondary | ICD-10-CM | POA: Diagnosis not present

## 2017-07-26 DIAGNOSIS — Z79899 Other long term (current) drug therapy: Secondary | ICD-10-CM | POA: Insufficient documentation

## 2017-07-26 DIAGNOSIS — I481 Persistent atrial fibrillation: Secondary | ICD-10-CM | POA: Diagnosis not present

## 2017-07-26 DIAGNOSIS — I110 Hypertensive heart disease with heart failure: Secondary | ICD-10-CM | POA: Diagnosis not present

## 2017-07-26 DIAGNOSIS — R531 Weakness: Secondary | ICD-10-CM | POA: Diagnosis not present

## 2017-07-26 DIAGNOSIS — R251 Tremor, unspecified: Secondary | ICD-10-CM | POA: Diagnosis not present

## 2017-07-26 DIAGNOSIS — R000 Tachycardia, unspecified: Secondary | ICD-10-CM | POA: Diagnosis present

## 2017-07-26 DIAGNOSIS — I4819 Other persistent atrial fibrillation: Secondary | ICD-10-CM

## 2017-07-26 LAB — BASIC METABOLIC PANEL
Anion gap: 21 — ABNORMAL HIGH (ref 5–15)
BUN: 8 mg/dL (ref 6–20)
CALCIUM: 7.9 mg/dL — AB (ref 8.9–10.3)
CO2: 25 mmol/L (ref 22–32)
CREATININE: 1.26 mg/dL — AB (ref 0.61–1.24)
Chloride: 98 mmol/L — ABNORMAL LOW (ref 101–111)
GFR calc Af Amer: >60 mL/min (ref >60)
GFR calc non Af Amer: 57 mL/min — ABNORMAL LOW (ref >60)
Glucose, Bld: 115 mg/dL — ABNORMAL HIGH (ref 65–99)
Potassium: 3.6 mmol/L (ref 3.5–5.1)
SODIUM: 144 mmol/L (ref 135–145)

## 2017-07-26 LAB — CBC
HCT: 43 % (ref 39.0–52.0)
Hemoglobin: 14.1 g/dL (ref 13.0–17.0)
MCH: 33.8 pg (ref 26.0–34.0)
MCHC: 32.8 g/dL (ref 30.0–36.0)
MCV: 103.1 fL — ABNORMAL HIGH (ref 78.0–100.0)
PLATELETS: 164 10*3/uL (ref 150–400)
RBC: 4.17 MIL/uL — AB (ref 4.22–5.81)
RDW: 15.9 % — AB (ref 11.5–15.5)
WBC: 8 10*3/uL (ref 4.0–10.5)

## 2017-07-26 LAB — I-STAT TROPONIN, ED: TROPONIN I, POC: 0 ng/mL (ref 0.00–0.08)

## 2017-07-26 LAB — DIGOXIN LEVEL

## 2017-07-26 LAB — MAGNESIUM: Magnesium: 1.2 mg/dL — ABNORMAL LOW (ref 1.7–2.4)

## 2017-07-26 MED ORDER — MAGNESIUM SULFATE 4 GM/100ML IV SOLN
4.0000 g | Freq: Once | INTRAVENOUS | Status: AC
  Administered 2017-07-26: 4 g via INTRAVENOUS
  Filled 2017-07-26 (×2): qty 100

## 2017-07-26 MED ORDER — ACETAMINOPHEN 325 MG PO TABS: 650.0000 mg | ORAL_TABLET | ORAL | Status: DC | PRN

## 2017-07-26 MED ORDER — METOPROLOL SUCCINATE ER 100 MG PO TB24
100.0000 mg | ORAL_TABLET | Freq: Two times a day (BID) | ORAL | Status: DC
  Administered 2017-07-26 – 2017-07-27 (×2): 100 mg via ORAL
  Filled 2017-07-26 (×2): qty 1

## 2017-07-26 MED ORDER — DILTIAZEM LOAD VIA INFUSION
20.0000 mg | Freq: Once | INTRAVENOUS | Status: AC
  Administered 2017-07-26: 20 mg via INTRAVENOUS
  Filled 2017-07-26: qty 20

## 2017-07-26 MED ORDER — ONDANSETRON HCL 4 MG/2ML IJ SOLN: 4.0000 mg | Freq: Four times a day (QID) | INTRAMUSCULAR | Status: DC | PRN

## 2017-07-26 MED ORDER — DILTIAZEM HCL 100 MG IV SOLR
5.0000 mg/h | INTRAVENOUS | Status: DC
  Administered 2017-07-26: 15 mg/h via INTRAVENOUS
  Administered 2017-07-26: 5 mg/h via INTRAVENOUS
  Administered 2017-07-27: 15 mg/h via INTRAVENOUS
  Filled 2017-07-26 (×4): qty 100

## 2017-07-26 MED ORDER — RIVAROXABAN 20 MG PO TABS
20.0000 mg | ORAL_TABLET | Freq: Once | ORAL | Status: AC
  Administered 2017-07-26: 20 mg via ORAL
  Filled 2017-07-26: qty 1

## 2017-07-26 MED ORDER — RIVAROXABAN 20 MG PO TABS
20.0000 mg | ORAL_TABLET | Freq: Every day | ORAL | Status: DC
  Administered 2017-07-27: 20 mg via ORAL
  Filled 2017-07-26: qty 1

## 2017-07-26 MED ORDER — NITROGLYCERIN 0.4 MG SL SUBL
0.4000 mg | SUBLINGUAL_TABLET | SUBLINGUAL | Status: DC | PRN
Start: 2017-07-26 — End: 2017-07-27

## 2017-07-26 NOTE — ED Triage Notes (Addendum)
Pt states he had an ECHO on Monday, saw cardiologist who told him to come to ED because his rate of afib was becoming too elevated. 150s at triage. Pt trremoring intermittently at triage, states he just feels nervous and it not tremoring when he is not being talked to.

## 2017-07-26 NOTE — ED Notes (Signed)
Cards at bedside

## 2017-07-26 NOTE — ED Provider Notes (Signed)
[facility] EMERGENCY DEPARTMENT Provider Note   CSN: [id number] Arrival date & time: 07/26/17  1317     History   Chief Complaint Chief Complaint  Patient presents with  . Atrial Fibrillation    HPI Patrick Roberts is a 67 y.o. male.  HPI   Patient is a 67 year old male with a history of atrial fibrillation (on Xarelto), hypertension, and basal cell carcinoma of the face presenting for elevated heart rate per his cardiology office.  Patient also noted to have soft blood pressure at his cardiology office.  Patient is followed by Dr. Curt Bears.  Patient was seen in cardiology this morning for routine follow-up when he was noted to be in Afib with RVR.  Patient reports that he was unaware of how high his heart rate is, and denies feeling any change in his physical status.  Patient specifically denies any chest pain, shortness of breath, lightheadedness, syncope or presyncope.  He does note some generalized weakness.  Patient does report tremulousness, which is not typical for him, but reports that he is very anxious to present to the hospital at present.  Past Medical History:  Diagnosis Date  . Alcohol abuse   . Chronic systolic (congestive) heart failure ([place])    a. TTE 8/17: EF 20-25%, mild conc LVH, mild MR, mild biatrial enlarge, mild-mod TR, PASP 45, aortic root 39 mm, small posterior pericardial effusion, Afib with RVR w/ HR 120-150 bpm; b. TTE 6/18: EF 60-65%, mod LVH, mild MR, mild biatrial enlarge, mildly dilated RV, midlly reduced RVSF, mild-mod TR  . Essential hypertension   . Persistent atrial fibrillation (HCC)    a. on Xarelto; b. CHADS2VASc 3 (CHF, HTN, age x 1)  . Skin cancer    surgery to remove from the face    Patient Active Problem List   Diagnosis Date Noted  . Congestive dilated cardiomyopathy ([place]) 11/05/2015  . Atrial fibrillation, persistent ([place]) 10/12/2015  . Alcohol abuse 10/04/2015  . Bilateral edema of lower extremity 10/04/2015  .  Abnormal liver enzymes 01/06/2015  . Essential (primary) hypertension 01/06/2015  . Pure hypercholesterolemia 01/06/2015  . Basal cell carcinoma of face 12/30/2014    Past Surgical History:  Procedure Laterality Date  . KNEE SURGERY     arthroscopic x 3  . TONSILLECTOMY          Home Medications    Prior to Admission medications   Medication Sig Start Date End Date Taking? Authorizing Provider  metoprolol succinate (TOPROL-XL) 100 MG 24 hr tablet Take 1 tablet (100 mg total) by mouth 2 (two) times daily. 07/05/16   Minna Merritts, MD  sacubitril-valsartan (ENTRESTO) 24-26 MG Take 1 tablet by mouth 2 (two) times daily. 07/10/17   Dunn, Ryan M, PA-C  XARELTO 20 MG TABS tablet TAKE 1 TABLET BY MOUTH DAILY WITH SUPPER 01/08/17   Minna Merritts, MD    Family History Family History  Problem Relation Age of Onset  . Dementia Mother   . Heart disease Father   . Heart attack Father     Social History Social History   Tobacco Use  . Smoking status: Never Smoker  . Smokeless tobacco: Never Used  Substance Use Topics  . Alcohol use: No    Alcohol/week: 4.2 oz    Types: 7 Standard drinks or equivalent per week    Comment: daily  . Drug use: No     Allergies   Patient has no known allergies.   Review of Systems  Review of Systems  Constitutional: Negative for chills and fever.  HENT: Negative for congestion and rhinorrhea.   Eyes: Negative for visual disturbance.  Respiratory: Negative for chest tightness and shortness of breath.   Cardiovascular: Negative for chest pain.  Gastrointestinal: Negative for abdominal pain, nausea and vomiting.  Musculoskeletal: Negative for myalgias.  Neurological: Positive for tremors and weakness. Negative for dizziness.       +Generalized weakness  All other systems reviewed and are negative.    Physical Exam Updated Vital Signs BP (!) 120/93   Pulse (!) 141   Temp 98 F (36.7 C) (Oral)   Resp 16   Ht 6\' 1"  (1.854 m)    Wt 84.4 kg (186 lb)   SpO2 95%   BMI 24.54 kg/m   Physical Exam  Constitutional: He appears well-developed and well-nourished. No distress.  HENT:  Head: Normocephalic and atraumatic.  Mouth/Throat: Oropharynx is clear and moist.  Bandage overlying skin of left face appears intact without drainage.  Eyes: Pupils are equal, round, and reactive to light. Conjunctivae and EOM are normal.  Neck: Normal range of motion. Neck supple.  Cardiovascular: S1 normal and S2 normal.  No murmur heard. Rapid rate, irregularly irregular rhythm  Pulmonary/Chest: Effort normal and breath sounds normal. He has no wheezes. He has no rales.  Abdominal: Soft. He exhibits no distension. There is no tenderness. There is no guarding.  Musculoskeletal: Normal range of motion. He exhibits no edema or deformity.  Neurological: He is alert.  Cranial nerves grossly intact. Patient moves extremities symmetrically and with good coordination. Normal and symmetric gait. Fine tremor of upper extremities noted.  Skin: Skin is warm and dry. No rash noted. No erythema.  Psychiatric: He has a normal mood and affect. His behavior is normal. Judgment and thought content normal.  Nursing note and vitals reviewed.    ED Treatments / Results  Labs (all labs ordered are listed, but only abnormal results are displayed) Labs Reviewed  BASIC METABOLIC PANEL - Abnormal; Notable for the following components:      Result Value   Chloride 98 (*)    Glucose, Bld 115 (*)    Creatinine, Ser 1.26 (*)    Calcium 7.9 (*)    GFR calc non Af Amer 57 (*)    Anion gap 21 (*)    All other components within normal limits  CBC - Abnormal; Notable for the following components:   RBC 4.17 (*)    MCV 103.1 (*)    RDW 15.9 (*)    All other components within normal limits  MAGNESIUM  DIGOXIN LEVEL  I-STAT TROPONIN, ED    EKG None  Radiology Dg Chest Port 1 View  Result Date: 07/26/2017 CLINICAL DATA:  Atrial fibrillation,  history chronic systolic CHF, essential hypertension EXAM: PORTABLE CHEST 1 VIEW COMPARISON:  Portable exam 1408 hours without priors for comparison FINDINGS: External pacing leads project over LEFT chest. Normal heart size, mediastinal contours, and pulmonary vascularity. Lungs clear. No acute infiltrate, pleural effusion or pneumothorax. Bones unremarkable. IMPRESSION: No acute abnormalities. Electronically Signed   By: Lavonia Dana M.D.   On: 07/26/2017 14:17    Procedures Procedures (including critical care time)  CRITICAL CARE Performed by: Albesa Seen   Total critical care time: 35 minutes  Critical care time was exclusive of separately billable procedures and treating other patients.  Critical care was necessary to treat or prevent imminent or life-threatening deterioration.  Critical care was time spent personally by  me on the following activities: development of treatment plan with patient and/or surrogate as well as nursing, discussions with consultants, evaluation of patient's response to treatment, examination of patient, obtaining history from patient or surrogate, ordering and performing treatments and interventions, ordering and review of laboratory studies, ordering and review of radiographic studies, pulse oximetry and re-evaluation of patient's condition.   Medications Ordered in ED Medications  diltiazem (CARDIZEM) 100 mg in dextrose 5 % 100 mL (1 mg/mL) infusion (12.5 mg/hr Intravenous Rate/Dose Change 07/26/17 1454)  rivaroxaban (XARELTO) tablet 20 mg (20 mg Oral Given 07/26/17 1505)  diltiazem (CARDIZEM) 1 mg/mL load via infusion 20 mg (20 mg Intravenous Bolus from Bag 07/26/17 1416)     Initial Impression / Assessment and Plan / ED Course  I have reviewed the triage vital signs and the nursing notes.  Pertinent labs & imaging results that were available during my care of the patient were reviewed by me and considered in my medical decision making (see chart for  details).    Patient is nontoxic-appearing and hemodynamically stable.  Patient has recent missed dose of AC, which precludes electrical cardioversion today.  Patient started on diltiazem load and infusion with good response.  No hemodynamic instability.  Patient given Xarelto in emergency department, as he missed his dose today.  Lab work remarkable for slightly elevated creatinine.  See below for trend.  Patient also hypocalcemic at 7.9.  Magnesium low at 1.2.   Lab Results  Component Value Date   CREATININE 1.26 (H) 07/26/2017   CREATININE 1.04 07/10/2017   CREATININE 1.28 (H) 07/07/2016    I personally spoke with PA Charlcie Cradle, who will admit the patient to cardiology.  Appreciate cardiology's involvement in the care of this patient.   Also discussed with cardiology physician assistant the etiology of patient's tremoring.  Patient denies regular heavy alcohol use.  Reports feeling anxious at this time.   Final Clinical Impressions(s) / ED Diagnoses   Final diagnoses:  Afib [facility])    ED Discharge Orders    None       Tamala Julian 07/26/17 1553    Pattricia Boss, MD 08/04/17 1625

## 2017-07-26 NOTE — H&P (Addendum)
Cardiology Admission History and Physical:   Patient ID: Patrick Roberts; MRN: [id number]; DOB: July 20, 1950   Admission date: 07/26/2017  Primary Care Provider: Jerrol Banana., MD Primary Cardiologist: Dr. Rockey Situ Primary Electrophysiologist:  New to Dr. Curt Bears today  Chief Complaint:  Rapid AFib  Patient Profile:   Patrick Roberts is a 67 y.o. male with a history of NICM with recovered EF (suspect 2/2 ETOH), HTN, depression, chronic LE edema/venous insufficiency and persistent Afib.  History of Present Illness:   Patrick Roberts was seen today in the AFib clinic for a scheduled visit.  Upon arrival found to be hypotensive, in rapid AFib, rates 160's.  He reported no CP, palpitations, + for fatigue, weakness, appeared somewhat shaky, given low BP felt RVR best managed in-patient.  PMHx notable for presentation in 09/2015 with lower extremity edema and found to be in atrial fibrillation.  At that time his ejection fraction was 20 to 25%.  He had mild concentric LVH, mild MR, biatrial enlargement and mild to moderate TR.  His heart rate was 1 in the 120s to 150s.  He has been on digoxin, Toprol-XL, Xarelto, and Entresto since that time.  Echo showed a normalization of his ejection fraction 07/2016.  He was more recently found last month to have elevated dig level and was stopped.  F/u echo was done earlier last month again noted preserved LVEF, though HR during the study 120-140 and referred to AFib clinic for further evaluation and management of his AFib, this is what today's visit was for.  He mentioned to Dr. Curt Bears, missing a dose of his Xarelto earlier this week.  LABS: poc Trop 0.00 WBC 8.0 H/H 14/43 WBC 164 BMET is pending Mag is pending Dig level is pending  The patient reports feeling "great!"  He denies any kind of CP, palpitations or SOB, he has no cardiac awareness at all.  Denies any ongoing weakness, no reports of near syncope or syncope.  He recently had surgery on his face for  a skin cancer removal and today had the remaining stitches removed.  He reports being told was healing very well, has not been instructed to hold his Xarelto.  He does appear a bit tremulous, he reports this in not unusual for him when he get anxious/nervous, and is quite worried about having to come into the hospital.  He did miss his xarelto dose Tuesday, "just forgot", not sure how often this happens.  He denies any ongoing significant ETOH use, admits to about 2-4 glasses of wine a week.   AFib hx: Diagnosed 2017 I do not see AAD hx Dig stopped may 2019 with elevated dig level (normalized LVEF) A/c w/Xarelto  Past Medical History:  Diagnosis Date  . Alcohol abuse   . Chronic systolic (congestive) heart failure ([facility])    a. TTE 8/17: EF 20-25%, mild conc LVH, mild MR, mild biatrial enlarge, mild-mod TR, PASP 45, aortic root 39 mm, small posterior pericardial effusion, Afib with RVR w/ HR 120-150 bpm; b. TTE 6/18: EF 60-65%, mod LVH, mild MR, mild biatrial enlarge, mildly dilated RV, midlly reduced RVSF, mild-mod TR  . Essential hypertension   . Persistent atrial fibrillation (HCC)    a. on Xarelto; b. CHADS2VASc 3 (CHF, HTN, age x 1)  . Skin cancer    surgery to remove from the face    Past Surgical History:  Procedure Laterality Date  . KNEE SURGERY     arthroscopic x 3  . TONSILLECTOMY  Medications Prior to Admission: Prior to Admission medications   Medication Sig Start Date End Date Taking? Authorizing Provider  metoprolol succinate (TOPROL-XL) 100 MG 24 hr tablet Take 1 tablet (100 mg total) by mouth 2 (two) times daily. 07/05/16   Minna Merritts, MD  sacubitril-valsartan (ENTRESTO) 24-26 MG Take 1 tablet by mouth 2 (two) times daily. 07/10/17   Dunn, Ryan M, PA-C  XARELTO 20 MG TABS tablet TAKE 1 TABLET BY MOUTH DAILY WITH SUPPER 01/08/17   Minna Merritts, MD     Allergies:   No Known Allergies  Social History:   Social History   Socioeconomic History  .  Marital status: Married    Spouse name: Not on file  . Number of children: Not on file  . Years of education: Not on file  . Highest education level: Not on file  Occupational History  . Not on file  Social Needs  . Financial resource strain: Not on file  . Food insecurity:    Worry: Not on file    Inability: Not on file  . Transportation needs:    Medical: Not on file    Non-medical: Not on file  Tobacco Use  . Smoking status: Never Smoker  . Smokeless tobacco: Never Used  Substance and Sexual Activity  . Alcohol use: No    Alcohol/week: 4.2 oz    Types: 7 Roberts drinks or equivalent per week    Comment: daily  . Drug use: No  . Sexual activity: Not on file  Lifestyle  . Physical activity:    Days per week: Not on file    Minutes per session: Not on file  . Stress: Not on file  Relationships  . Social connections:    Talks on phone: Not on file    Gets together: Not on file    Attends religious service: Not on file    Active member of club or organization: Not on file    Attends meetings of clubs or organizations: Not on file    Relationship status: Not on file  . Intimate partner violence:    Fear of current or ex partner: Not on file    Emotionally abused: Not on file    Physically abused: Not on file    Forced sexual activity: Not on file  Other Topics Concern  . Not on file  Social History Narrative  . Not on file    Family History:   The patient's family history includes Dementia in his mother; Heart attack in his father; Heart disease in his father.    ROS:  Please see the history of present illness.  All other ROS reviewed and negative.     Physical Exam/Data:   Vitals:   07/26/17 1328 07/26/17 1330  BP: (!) 134/95   Pulse: (!) 150   Resp: 18   Temp: 98 F (36.7 C)   TempSrc: Oral   SpO2: 99%   Weight:  186 lb (84.4 kg)  Height:  6\' 1"  (1.854 m)   No intake or output data in the 24 hours ending 07/26/17 1402 Filed Weights   07/26/17  1330  Weight: 186 lb (84.4 kg)   Body mass index is 24.54 kg/m.  General:  Well nourished, well developed, in no acute distress, though appears anxious HEENT: normal, he has ecchymosis particularly L cheek/face older stages of coloring, clean bandage in place Lymph: no adenopathy Neck: no JVD Endocrine:  No thryomegaly Vascular: No carotid bruits Cardiac:  iRRR; tachycardic, no murmurs, gallops or rubs Lungs:  CTA b/l, no wheezing, rhonchi or rales  Abd: soft, nontender  Ext: no edema Musculoskeletal:  No deformities Skin: warm and dry  Neuro:  No gross focal abnormalities noted Psych:  Normal affect    EKG:  The ECG that was done today was personally reviewed and demonstrates  AFib, 164bpm  Relevant CV Studies:  07/16/17: TTE Study Conclusions - Left ventricle: The cavity size was normal. There was mild   concentric hypertrophy. Systolic function was normal. The   estimated ejection fraction was in the range of 55% to 60%. Wall   motion was normal; there were no regional wall motion   abnormalities. The study was not technically sufficient to allow   evaluation of LV diastolic dysfunction due to atrial   fibrillation. - Mitral valve: There was mild regurgitation. - Left atrium: The atrium was mildly dilated. - Pulmonary arteries: Systolic pressure was within the normal   range. Impressions: - Tachycardia noted during the study with HR: 120-140 bpm.  Laboratory Data:  ChemistryNo results for input(s): NA, K, CL, CO2, GLUCOSE, BUN, CREATININE, CALCIUM, GFRNONAA, GFRAA, ANIONGAP in the last 168 hours.  No results for input(s): PROT, ALBUMIN, AST, ALT, ALKPHOS, BILITOT in the last 168 hours. HematologyNo results for input(s): WBC, RBC, HGB, HCT, MCV, MCH, MCHC, RDW, PLT in the last 168 hours. Cardiac EnzymesNo results for input(s): TROPONINI in the last 168 hours.  Recent Labs  Lab 07/26/17 1339  TROPIPOC 0.00    BNPNo results for input(s): BNP, PROBNP in the last 168  hours.  DDimer No results for input(s): DDIMER in the last 168 hours.  Radiology/Studies:  No results found.  Assessment and Plan:   1. Longstanding persistent AFib, admitted with RVR     CHA2DS2Vasc is 3, on Xarelto      Looks like he has been fast at least since last week, given hx of CM, need to rate control.  It does not appear he has had an hx of AAD.  Unfortunately missed Xarelto this week.  His BP is much better here in the ER, allows more room for meds. Discussed options with the patient, IV amiodarone for rate control now with plans for TEE DCCV, though he has personal things he needs to attend to, and Patrick Roberts start with rate control strategy and plan out patient to initiate AAD once he has uninterrupted a/c.  Admit to telemetry, continue dilt titration to BP tolerance, plan to start PO dilt tomorrow with goal of home perhaps tomorrow afternoon, Sat AM with AFib clinic f/u  We discussed at length stroke/embolic risk with his AF and importance of medicine compliance, he got a dose of xarelto here in the ER  2. HTN     Had relative hypotension at the Afib clinic, is better now     Continue lopressor with dilt today     Revisit putting Entresto back tomorrow AM  3. Hx of NICM w/recovered EF     Exam/cxr do not suggest fluid OL  4. Hx of ETOH abuse     Pt reports no more then 2-4 glasses of wine weekly     Counseled on importance of this       Severity of Illness: The appropriate patient status for this patient is INPATIENT. Inpatient status is judged to be reasonable and necessary in order to provide the required intensity of service to ensure the patient's safety. The patient's presenting symptoms, physical exam findings, and  initial radiographic and laboratory data in the context of their chronic comorbidities is felt to place them at high risk for further clinical deterioration. Furthermore, it is not anticipated that the patient Patrick Roberts be medically stable for discharge from the  hospital within 2 midnights of admission. The following factors support the patient status of inpatient.   " The patient's presenting symptoms include rapid AFib. " The worrisome physical exam findings include hypotension  * I certify that at the point of admission it is my clinical judgment that the patient Patrick Roberts require inpatient hospital care spanning beyond 2 midnights from the point of admission due to high intensity of service, high risk for further deterioration and high frequency of surveillance required.*   For questions or updates, please contact [place] Please consult [email or web address] for contact info under Cardiology/STEMI.    Signed, Baldwin Jamaica, PA-C  07/26/2017 2:02 PM   I have seen and examined this patient with Patrick Roberts.  Agree with above, note added to reflect my findings.  On exam, iRRR, no murmurs, lungs clear.  Patient admitted due to atrial fibrillation with rapid rates.  He was seen in atrial fibrillation clinic with hypotension and rates in the 160s.  He does have a history of a nonischemic cardiomyopathy, possibly tachycardia mediated.  He has been started on diltiazem with improved rate control.  We Patrick Roberts continue with p.o. diltiazem tomorrow.  Hopefully he Patrick Roberts be able to discharge tomorrow.  He has missed doses of his Xarelto, and Patrick Roberts likely need either further rate control versus rhythm control with amiodarone loading.  Would prefer to avoid rhythm control until he has uninterrupted anticoagulation.  Patrick Roberts M. Esbeydi Manago MD 07/26/2017 4:00 PM

## 2017-07-26 NOTE — ED Notes (Signed)
Patient having extreme tremors in his extremities; denies drinking more than glass or 2 of wine with dinner at night. Appears anxious.

## 2017-07-26 NOTE — Progress Notes (Signed)
Electrophysiology Office Note   Date:  07/26/2017   ID:  Patrick Roberts, DOB 10-18-1950, MRN [id number]  PCP:  Jerrol Banana., MD  Cardiologist:  Rockey Situ Primary Electrophysiologist:  Will Meredith Leeds, MD    No chief complaint on file.    History of Present Illness: Patrick Roberts is a 67 y.o. male who is being seen today for the evaluation of atrial fibrillation at the request of Christell Faith. Presenting today for electrophysiology evaluation.  He has a history of persistent atrial fibrillation on Xarelto, nonischemic cardiomyopathy thought secondary to alcohol abuse with normalization of his ejection fraction in 2018, hypertension, and depression.  He presented 09/2015 with lower extremity edema and found to be in atrial fibrillation.  At that time his ejection fraction was 20 to 25%.  He had mild concentric LVH, mild MR, biatrial enlargement and mild to moderate TR.  His heart rate was 1 in the 120s to 150s.  He has been on digoxin, Toprol-XL, Xarelto, and Entresto since that time.  Echo showed a normalization of his ejection fraction 07/2016.  He presents today in clinic in atrial fibrillation.  His heart rate is 164 bpm and his blood pressure is 88/62.  The patient says that he feels well, though he is quite shaky today in clinic.  He does have some weakness and fatigue.  He does not notice palpitations.  Today, he denies symptoms of palpitations, chest pain, shortness of breath, orthopnea, PND, lower extremity edema, claudication, dizziness, presyncope, syncope, bleeding, or neurologic sequela. The patient is tolerating medications without difficulties.    Past Medical History:  Diagnosis Date  . Alcohol abuse   . Chronic systolic (congestive) heart failure ([facility])    a. TTE 8/17: EF 20-25%, mild conc LVH, mild MR, mild biatrial enlarge, mild-mod TR, PASP 45, aortic root 39 mm, small posterior pericardial effusion, Afib with RVR w/ HR 120-150 bpm; b. TTE 6/18: EF 60-65%, mod LVH,  mild MR, mild biatrial enlarge, mildly dilated RV, midlly reduced RVSF, mild-mod TR  . Essential hypertension   . Persistent atrial fibrillation (HCC)    a. on Xarelto; b. CHADS2VASc 3 (CHF, HTN, age x 1)   Past Surgical History:  Procedure Laterality Date  . KNEE SURGERY     arthroscopic x 3  . TONSILLECTOMY       Current Outpatient Medications  Medication Sig Dispense Refill  . metoprolol succinate (TOPROL-XL) 100 MG 24 hr tablet Take 1 tablet (100 mg total) by mouth 2 (two) times daily. 180 tablet 3  . sacubitril-valsartan (ENTRESTO) 24-26 MG Take 1 tablet by mouth 2 (two) times daily. 180 tablet 3  . XARELTO 20 MG TABS tablet TAKE 1 TABLET BY MOUTH DAILY WITH SUPPER 30 tablet 3   No current facility-administered medications for this encounter.     Allergies:   Patient has no known allergies.   Social History:  The patient  reports that he has never smoked. He has never used smokeless tobacco. He reports that he does not drink alcohol or use drugs.   Family History:  The patient's family history includes Dementia in his mother; Heart attack in his father; Heart disease in his father.    ROS:  Please see the history of present illness.   Otherwise, review of systems is positive for none.   All other systems are reviewed and negative.    PHYSICAL EXAM: VS:  BP (!) 88/62   Pulse (!) 164   Ht 6\' 1"  (1.854 m)  Wt 186 lb (84.4 kg)   BMI 24.54 kg/m  , BMI Body mass index is 24.54 kg/m. GEN: Well nourished, well developed, in no acute distress  HEENT: normal  Neck: no JVD, carotid bruits, or masses Cardiac: Tachycardic, irregular; no murmurs, rubs, or gallops,no edema  Respiratory:  clear to auscultation bilaterally, normal work of breathing GI: soft, nontender, nondistended, + BS MS: no deformity or atrophy  Skin: warm and dry Neuro:  Strength and sensation are intact Psych: euthymic mood, full affect  EKG:  EKG is ordered today. Personal review of the ekg ordered  shows atrial fibrillation, rate 164, diffuse ST depressions  Recent Labs: 07/10/2017: BUN 8; Creatinine, Ser 1.04; Hemoglobin 14.3; Platelets 106; Potassium 3.4; Sodium 143    Lipid Panel     Component Value Date/Time   CHOL 195 01/13/2015 1231   TRIG 152 (H) 01/13/2015 1231   HDL 81 01/13/2015 1231   LDLCALC 84 01/13/2015 1231     Wt Readings from Last 3 Encounters:  07/26/17 186 lb (84.4 kg)  07/10/17 190 lb 5 oz (86.3 kg)  08/18/16 208 lb 8 oz (94.6 kg)      Other studies Reviewed: Additional studies/ records that were reviewed today include: TTE 07/16/17  Review of the above records today demonstrates:  - Left ventricle: The cavity size was normal. There was mild   concentric hypertrophy. Systolic function was normal. The   estimated ejection fraction was in the range of 55% to 60%. Wall   motion was normal; there were no regional wall motion   abnormalities. The study was not technically sufficient to allow   evaluation of LV diastolic dysfunction due to atrial   fibrillation. - Mitral valve: There was mild regurgitation. - Left atrium: The atrium was mildly dilated. - Pulmonary arteries: Systolic pressure was within the normal   range.   ASSESSMENT AND PLAN:  1.  Persistent atrial fibrillation: Currently on Toprol-XL 100 mg twice a day as well as digoxin.  He is on Xarelto for anticoagulation, but did miss a dose this week.  I have told him the importance of compliance with his anticoagulation.  He is also quite tachycardic with some hypotension.  Due to that, I feel that it is necessary for him to go to the emergency room for further evaluation and potentially IV rate control.  His low blood pressure does limit the medications that he can receive, though diltiazem versus IV amiodarone may be warranted.  He does have long-standing persistent atrial fibrillation, though his left atrium is not severely dilated.  A trial of amiodarone and cardioversion with rate control may  also be warranted.  Should this not be productive, he may benefit from AV node ablation and pacemaker implant.  This patients CHA2DS2-VASc Score and unadjusted Ischemic Stroke Rate (% per year) is equal to 3.2 % stroke rate/year from a score of 3  Above score calculated as 1 point each if present [CHF, HTN, DM, Vascular=MI/PAD/Aortic Plaque, Age if 65-74, or Male] Above score calculated as 2 points each if present [Age > 75, or Stroke/TIA/TE]  2.  Chronic systolic heart failure due to nonischemic cardiomyopathy: Currently on Toprol-XL and Entresto.  His Entresto dose was recently decreased.  No obvious signs of volume overload at this time  3.  Alcohol abuse: Drinks 1 glass of wine every 2 to 3 days.  Denies any other alcohol abuse.  4.  Hypertension: Blood pressure is actually low today with his rapid atrial fibrillation.  He  may be required to stop his Entresto in the future.    Current medicines are reviewed at length with the patient today.   The patient does not have concerns regarding his medicines.  The following changes were made today:  none  Labs/ tests ordered today include:  No orders of the defined types were placed in this encounter.    Disposition:   FU pending hospital visit  Signed, Will Meredith Leeds, MD  07/26/2017 11:42 AM     [place] [address] [place] [address] [phone] (office) [phone] (fax)

## 2017-07-27 ENCOUNTER — Encounter (HOSPITAL_COMMUNITY): Payer: Self-pay | Admitting: *Deleted

## 2017-07-27 ENCOUNTER — Other Ambulatory Visit: Payer: Self-pay

## 2017-07-27 DIAGNOSIS — I481 Persistent atrial fibrillation: Secondary | ICD-10-CM | POA: Diagnosis not present

## 2017-07-27 MED ORDER — DILTIAZEM HCL ER COATED BEADS 180 MG PO CP24: 180.0000 mg | ORAL_CAPSULE | Freq: Two times a day (BID) | ORAL | 6 refills | Status: DC

## 2017-07-27 MED ORDER — MAGNESIUM OXIDE 400 MG PO TABS: 400.0000 mg | ORAL_TABLET | Freq: Every day | ORAL | 3 refills | Status: DC

## 2017-07-27 MED ORDER — SACUBITRIL-VALSARTAN 24-26 MG PO TABS
1.0000 | ORAL_TABLET | Freq: Two times a day (BID) | ORAL | Status: DC
  Administered 2017-07-27: 1 via ORAL
  Filled 2017-07-27 (×2): qty 1

## 2017-07-27 MED ORDER — DILTIAZEM HCL ER COATED BEADS 180 MG PO CP24
180.0000 mg | ORAL_CAPSULE | Freq: Two times a day (BID) | ORAL | Status: DC
  Administered 2017-07-27: 180 mg via ORAL
  Filled 2017-07-27: qty 1

## 2017-07-27 NOTE — Discharge Instructions (Signed)

## 2017-07-27 NOTE — Care Management Obs Status (Signed)
[facility] NOTIFICATION   Patient Details  Name: Patrick Roberts MRN: [id number] Date of Birth: Jun 01, 1950   Medicare Observation Status Notification Given:  Yes    Bethena Roys, RN 07/27/2017, 3:39 PM

## 2017-07-27 NOTE — Progress Notes (Signed)
Patient received discharge information and acknowledged understanding of it. Patient IVs were removed.  

## 2017-07-27 NOTE — Discharge Summary (Addendum)
DISCHARGE SUMMARY    Patient ID: Patrick Roberts,  MRN: [id number], DOB/AGE: 09/02/1950 67 y.o.  Admit date: 07/26/2017 Discharge date: 07/27/2017  Primary Care Physician: Patrick Merritts, MD  Primary Cardiologist: Dr. Rockey Roberts Electrophysiologist: new to Dr. Curt Roberts  Primary Discharge Diagnosis:  1. Persistent AFib       Secondary Discharge Diagnosis:  1. HTN 2. NICM w/recorvered LVEF 3. Hx of ETOH abuse       No Known Allergies   Procedures This Admission:  None   Brief HPI: Patrick Roberts is a 67 y.o. male with a history of NICM with recovered EF (suspect 2/2 ETOH), HTN, depression, chronic LE edema/venous insufficiency and persistent Afib admitted with rapid AFib  [name] was seen yesterday in the AFib clinic for a scheduled visit.  Upon arrival found to be hypotensive, in rapid AFib, rates 160's.  He reported no CP, palpitations, + for fatigue, weakness, appeared somewhat shaky, given low BP felt RVR best managed in-patient  Hospital Course:  The patient was admitted and started on diltiazem gtt for rate control, he had low magnesium level that was replaced, dig level was followed up and was <0.2  The patient reported feeling "great!"  He denied any kind of CP, palpitations or SOB, he has no cardiac awareness at all.  Denied any ongoing weakness, no reports of near syncope or syncope.  He recently had surgery on his face for a skin cancer removal and today had the remaining stitches removed.  He reported being told was healing very well, has not been instructed to hold his Xarelto, but had forgotten a dose earlier in the week.  The day of discharge he was transitioned to PO diltiazem, he remained with AFib rates 80-100 range at rest, 90-120 with exertion.  He reports feeling very well today, can tell he feels better because his legs feel stronger.  His BP has remained stable, tolerating medicines well.    We discussed at length the importance of medication compliance,  particularly his Xarelto.  Once he has been on his xarelto uninterrupted for 3 weeks, can plan to initiate AAD therapy with thoughts towards rhythm control strategy.  We also discussed ETOH.  The patient states that he does intermittently drink heavily though states that after this hospitalization which has been a wake up call for him he feels very motivated and states he plans to reach out to his PMD for referral to a counselor.  He feels much better today, and feels much more at ease he states now that he has a plan for this as well as a plan in place for his AF.  He denies any kind of symptoms and feels ready for discharge.  The patient was examined by Dr. Curt Roberts and considered stable for discharge to home.    Physical Exam: Vitals:   07/26/17 2128 07/26/17 2134 07/27/17 0415 07/27/17 0825  BP: (!) 145/95 122/60 118/73 113/82  Pulse: 97 (!) 59 (!) 112 (!) 125  Resp: 18 18 12    Temp: 98.1 F (36.7 C) 98.6 F (37 C) (!) 97.3 F (36.3 C)   TempSrc: Oral Oral Oral   SpO2: 97% 96% 96%   Weight:      Height:        GEN- The patient is well appearing, alert and oriented x 3 today.   HEENT: normocephalic, atraumatic; sclera clear, conjunctiva pink; hearing intact; oropharynx clear; neck supple, no JVP Lungs- CTA b/l, normal work of breathing.  No  wheezes, rales, rhonchi Heart- iRRR, no murmurs, rubs or gallops, PMI not laterally displaced GI- soft, non-tender, non-distended Extremities- no clubbing, cyanosis, or edema MS- no significant deformity or atrophy Skin- warm and dry, no rash or lesion, left chest without hematoma/ecchymosis Psych- euthymic mood, full affect.  He appears much less anxious and much more relaxed today Neuro- no gross deficits   Labs:   Lab Results  Component Value Date   WBC 8.0 07/26/2017   HGB 14.1 07/26/2017   HCT 43.0 07/26/2017   MCV 103.1 (H) 07/26/2017   PLT 164 07/26/2017    Recent Labs  Lab 07/26/17 1334  NA 144  K 3.6  CL 98*  CO2 25    BUN 8  CREATININE 1.26*  CALCIUM 7.9*  GLUCOSE 115*    Discharge Medications:  Allergies as of 07/27/2017   No Known Allergies     Medication List    TAKE these medications   diltiazem 180 MG 24 hr capsule Commonly known as:  CARDIZEM CD Take 1 capsule (180 mg total) by mouth 2 (two) times daily.   magnesium oxide 400 MG tablet Commonly known as:  MAG-OX Take 1 tablet (400 mg total) by mouth daily.   metoprolol succinate 100 MG 24 hr tablet Commonly known as:  TOPROL-XL Take 1 tablet (100 mg total) by mouth 2 (two) times daily.   sacubitril-valsartan 24-26 MG Commonly known as:  ENTRESTO Take 1 tablet by mouth 2 (two) times daily.   XARELTO 20 MG Tabs tablet Generic drug:  rivaroxaban TAKE 1 TABLET BY MOUTH DAILY WITH SUPPER       Disposition:  Home Discharge Instructions    Diet - low sodium heart healthy   Complete by:  As directed    Increase activity slowly   Complete by:  As directed      Follow-up Information    Roberts, Patrick November, MD Follow up.   Specialty:  Cardiology Why:  Call on Monday as discussed to follow up on referral discussed Contact information: [place] [address] [phone]        [facility] Follow up on 08/14/2017.   Specialty:  Cardiology Why:  10:30AM Contact information: [address] [id number] [place] [phone]          Duration of Discharge Encounter: Greater than 30 minutes including physician time.  Patrick Night, PA-C 07/27/2017 2:38 PM  I have seen and examined this patient with Tommye Roberts.  Agree with above, note added to reflect my findings.  On exam, RRR, no murmurs, lungs clear. Admitted for rapid atrial fibrillation. Put on diltiazem drip and converted to PO. Plan for discharge today with follow up in AF clinic. Long discussion had with the patient about EtOH abuse and stopping drinking.     Patrick Mikesell M.  Foye Damron MD 07/27/2017 4:58 PM

## 2017-07-31 ENCOUNTER — Other Ambulatory Visit: Payer: Self-pay | Admitting: Cardiovascular Disease

## 2017-07-31 NOTE — Telephone Encounter (Signed)
Please review for refill, Thanks !  

## 2017-08-14 ENCOUNTER — Ambulatory Visit (HOSPITAL_COMMUNITY)
Admission: RE | Admit: 2017-08-14 | Discharge: 2017-08-14 | Disposition: A | Discharge status: Home / Self Care | Payer: Medicare Other | Source: Ambulatory Visit | Attending: Nurse Practitioner | Admitting: Nurse Practitioner

## 2017-08-14 ENCOUNTER — Telehealth: Payer: Self-pay | Admitting: Cardiovascular Disease

## 2017-08-14 ENCOUNTER — Encounter (HOSPITAL_COMMUNITY): Payer: Self-pay | Admitting: Nurse Practitioner

## 2017-08-14 VITALS — BP 124/82 | HR 96 | Ht 73.0 in | Wt 188.6 lb

## 2017-08-14 DIAGNOSIS — I481 Persistent atrial fibrillation: Secondary | ICD-10-CM

## 2017-08-14 DIAGNOSIS — I4819 Other persistent atrial fibrillation: Secondary | ICD-10-CM

## 2017-08-14 NOTE — Telephone Encounter (Signed)
Paper work obtained. Given to Dr. Donivan Scull nurse to review with MD.

## 2017-08-14 NOTE — Progress Notes (Signed)
Electrophysiology Office Note   Date:  08/14/2017   ID:  Patrick Roberts, DOB 1950-04-06, MRN [id number]  PCP:  Minna Merritts, MD  Cardiologist:  Rockey Situ Primary Electrophysiologist:  Roderic Palau, NP    Chief Complaint  Patient presents with  . Atrial Fibrillation     History of Present Illness: Patrick Roberts is a 67 y.o. male who is being seen today for the evaluation of atrial fibrillation at the request of Christell Faith. Presenting today for electrophysiology evaluation.  He has a history of persistent atrial fibrillation on Xarelto, nonischemic cardiomyopathy thought secondary to alcohol abuse with normalization of his ejection fraction in 2018, hypertension, and depression.  He presented 09/2015 with lower extremity edema and found to be in atrial fibrillation.  At that time his ejection fraction was 20 to 25%.  He had mild concentric LVH, mild MR, biatrial enlargement and mild to moderate TR.  His heart rate was   in the 120s to 150s.  He has been on digoxin, Toprol-XL, Xarelto, and Entresto since that time.  Echo showed a normalization of his ejection fraction 07/2016.  When he presented 07/26/17 to the clinic his heart rate was 164 bpm and his blood pressure was 88/62.  The patient says that he felt well, though he was quite shaky. He did have some weakness and fatigue.  He did not notice palpitations.  He was admitted and rate controlled with Cardizem IV and then transitioned to po Cardizem, as he had missed some doses of DOAC recently, not a cardioversion or chemical cardioversion candidate at that time.   He is in the office  today and is rate controlled and feels well. He continues on 180 mg cardizem BID. He is taking his xarelto 20 mg daily. BP stable. He currently states that he is not drinking alcohol currently.  Today, he denies symptoms of palpitations, chest pain, shortness of breath, orthopnea, PND, lower extremity edema, claudication, dizziness, presyncope, syncope, bleeding,  or neurologic sequela. The patient is tolerating medications without difficulties.    Past Medical History:  Diagnosis Date  . Alcohol abuse   . Chronic systolic (congestive) heart failure ([place])    a. TTE 8/17: EF 20-25%, mild conc LVH, mild MR, mild biatrial enlarge, mild-mod TR, PASP 45, aortic root 39 mm, small posterior pericardial effusion, Afib with RVR w/ HR 120-150 bpm; b. TTE 6/18: EF 60-65%, mod LVH, mild MR, mild biatrial enlarge, mildly dilated RV, midlly reduced RVSF, mild-mod TR  . Essential hypertension   . Persistent atrial fibrillation (HCC)    a. on Xarelto; b. CHADS2VASc 3 (CHF, HTN, age x 1)  . Skin cancer    surgery to remove from the face   Past Surgical History:  Procedure Laterality Date  . KNEE SURGERY     arthroscopic x 3  . TONSILLECTOMY       Current Outpatient Medications  Medication Sig Dispense Refill  . diltiazem (CARDIZEM CD) 180 MG 24 hr capsule Take 1 capsule (180 mg total) by mouth 2 (two) times daily. 60 capsule 6  . magnesium oxide (MAG-OX) 400 MG tablet Take 1 tablet (400 mg total) by mouth daily. 30 tablet 3  . metoprolol succinate (TOPROL-XL) 100 MG 24 hr tablet Take 1 tablet (100 mg total) by mouth 2 (two) times daily. 180 tablet 3  . sacubitril-valsartan (ENTRESTO) 24-26 MG Take 1 tablet by mouth 2 (two) times daily. 180 tablet 3  . XARELTO 20 MG TABS tablet TAKE 1 TABLET BY MOUTH  DAILY WITH SUPPER 30 tablet 3   No current facility-administered medications for this encounter.     Allergies:   Patient has no known allergies.   Social History:  The patient  reports that he has never smoked. He has never used smokeless tobacco. He reports that he does not drink alcohol or use drugs.   Family History:  The patient's family history includes Dementia in his mother; Heart attack in his father; Heart disease in his father.    ROS:  Please see the history of present illness.   Otherwise, review of systems is positive for none.   All other  systems are reviewed and negative.    PHYSICAL EXAM: VS:  BP 124/82 (BP Location: Left Arm, Patient Position: Sitting, Cuff Size: Normal)   Pulse 96   Ht 6\' 1"  (1.854 m)   Wt 188 lb 9.6 oz (85.5 kg)   BMI 24.88 kg/m  , BMI Body mass index is 24.88 kg/m. GEN: Well nourished, well developed, in no acute distress  HEENT: normal  Neck: no JVD, carotid bruits, or masses Cardiac: Tachycardic, irregular; no murmurs, rubs, or gallops,no edema  Respiratory:  clear to auscultation bilaterally, normal work of breathing GI: soft, nontender, nondistended, + BS MS: no deformity or atrophy  Skin: warm and dry Neuro:  Strength and sensation are intact Psych: euthymic mood, full affect  EKG:  EKG is ordered today. Personal review of the ekg ordered shows atrial fibrillation, rate 96, qtc 469 ms  Recent Labs: 07/26/2017: BUN 8; Creatinine, Ser 1.26; Hemoglobin 14.1; Magnesium 1.2; Platelets 164; Potassium 3.6; Sodium 144    Lipid Panel     Component Value Date/Time   CHOL 195 01/13/2015 1231   TRIG 152 (H) 01/13/2015 1231   HDL 81 01/13/2015 1231   LDLCALC 84 01/13/2015 1231     Wt Readings from Last 3 Encounters:  08/14/17 188 lb 9.6 oz (85.5 kg)  07/26/17 182 lb (82.6 kg)  07/26/17 186 lb (84.4 kg)      Other studies Reviewed: Additional studies/ records that were reviewed today include: TTE 07/16/17  Review of the above records today demonstrates:  - Left ventricle: The cavity size was normal. There was mild   concentric hypertrophy. Systolic function was normal. The   estimated ejection fraction was in the range of 55% to 60%. Wall   motion was normal; there were no regional wall motion   abnormalities. The study was not technically sufficient to allow   evaluation of LV diastolic dysfunction due to atrial   fibrillation. - Mitral valve: There was mild regurgitation. - Left atrium: The atrium was mildly dilated. - Pulmonary arteries: Systolic pressure was within the normal    range.   ASSESSMENT AND PLAN:  1.  Persistent atrial fibrillation:  Currently rate controlled on Toprol-XL 100 mg twice a day and diltiazem 180 mg bid   He is on Xarelto 20 mg qd for anticoagulation and is trying to take it on a regular basis He feels well.  I discussed with Dr. Curt Bears re plan going forward. He feels that it may be difficult to return and maintain SR as he has been in persistent afib for so long. If pt does pursue this, he feels that Phyllis Ginger may be the best option to hold pt in SR. Pt will check on the price of the Tikosyn dn get back to me if he wants to pursue.  This patients CHA2DS2-VASc Score and unadjusted Ischemic Stroke Rate (% per year)  is equal to 3.2 % stroke rate/year from a score of 3  Above score calculated as 1 point each if present [CHF, HTN, DM, Vascular=MI/PAD/Aortic Plaque, Age if 65-74, or Male] Above score calculated as 2 points each if present [Age > 75, or Stroke/TIA/TE]  2.  Chronic systolic heart failure due to nonischemic cardiomyopathy: Currently on Toprol-XL and Entresto.  His Entresto dose was recently decreased.  No obvious signs of volume overload at this time  3.  Alcohol abuse:   Denies any other alcohol use at this time.  4.  Hypertension  Stable   Current medicines are reviewed at length with the patient today.   The patient does not have concerns regarding his medicines.  The following changes were made today:  none  Labs/ tests ordered today include:  Orders Placed This Encounter  Procedures  . EKG 12-Lead     Signed, Geroge Baseman. Mehdi Gironda, [facility] [address] [place], [place] [address] [phone]

## 2017-08-14 NOTE — Telephone Encounter (Signed)
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

## 2017-08-20 NOTE — Telephone Encounter (Signed)
Patient calling to check status of clearance letter please call.

## 2017-08-21 NOTE — Telephone Encounter (Signed)
Spoke with patient and reviewed that he would not be cleared at this time based on his current rhythm and rate control. 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 [place] to let them know of status on his clearance. Will route this message over via Epic Fax for their review. Patient is being seen in the afib clinic in [place] office. Patient verbalized understanding of denial at this time and he will continue to follow up.

## 2017-08-21 NOTE — Telephone Encounter (Signed)
Paperwork placed in "Save" bin above [employer].

## 2017-08-23 ENCOUNTER — Telehealth: Payer: Self-pay | Admitting: Cardiovascular Disease

## 2017-08-23 NOTE — Telephone Encounter (Signed)
Spoke with patient and advised that I would place paperwork up front for him to pick up later today. He verbalized understanding with no further questions at this time.

## 2017-08-23 NOTE — Telephone Encounter (Signed)
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 appt on Monday.

## 2017-08-27 ENCOUNTER — Other Ambulatory Visit: Payer: Self-pay

## 2017-08-27 ENCOUNTER — Inpatient Hospital Stay (HOSPITAL_COMMUNITY)
Admission: RE | Admit: 2017-08-27 | Discharge: 2017-08-30 | DRG: 310 | Disposition: A | Discharge status: Home / Self Care | Payer: Medicare Other | Source: Ambulatory Visit | Attending: Cardiology | Admitting: Cardiology

## 2017-08-27 ENCOUNTER — Ambulatory Visit (HOSPITAL_BASED_OUTPATIENT_CLINIC_OR_DEPARTMENT_OTHER)
Admission: RE | Admit: 2017-08-27 | Discharge: 2017-08-27 | Disposition: A | Discharge status: Home / Self Care | Payer: Medicare Other | Source: Ambulatory Visit | Attending: Nurse Practitioner | Admitting: Nurse Practitioner

## 2017-08-27 ENCOUNTER — Encounter (HOSPITAL_COMMUNITY): Payer: Self-pay | Admitting: Nurse Practitioner

## 2017-08-27 VITALS — BP 108/62 | HR 89 | Ht 73.0 in | Wt 185.6 lb

## 2017-08-27 DIAGNOSIS — I4819 Other persistent atrial fibrillation: Secondary | ICD-10-CM

## 2017-08-27 DIAGNOSIS — I10 Essential (primary) hypertension: Secondary | ICD-10-CM

## 2017-08-27 DIAGNOSIS — I428 Other cardiomyopathies: Secondary | ICD-10-CM

## 2017-08-27 DIAGNOSIS — Z7901 Long term (current) use of anticoagulants: Secondary | ICD-10-CM | POA: Diagnosis not present

## 2017-08-27 DIAGNOSIS — Z79899 Other long term (current) drug therapy: Secondary | ICD-10-CM

## 2017-08-27 DIAGNOSIS — Z5181 Encounter for therapeutic drug level monitoring: Secondary | ICD-10-CM

## 2017-08-27 DIAGNOSIS — I481 Persistent atrial fibrillation: Secondary | ICD-10-CM | POA: Diagnosis not present

## 2017-08-27 DIAGNOSIS — Z8249 Family history of ischemic heart disease and other diseases of the circulatory system: Secondary | ICD-10-CM

## 2017-08-27 DIAGNOSIS — Z85828 Personal history of other malignant neoplasm of skin: Secondary | ICD-10-CM

## 2017-08-27 HISTORY — DX: Unspecified malignant neoplasm of skin of unspecified part of face: C44.300

## 2017-08-27 LAB — BASIC METABOLIC PANEL
ANION GAP: 10 (ref 5–15)
BUN: 20 mg/dL (ref 8–23)
CHLORIDE: 106 mmol/L (ref 98–111)
CO2: 23 mmol/L (ref 22–32)
CREATININE: 1.57 mg/dL — AB (ref 0.61–1.24)
Calcium: 9.3 mg/dL (ref 8.9–10.3)
GFR calc non Af Amer: 44 mL/min — ABNORMAL LOW (ref >60)
GFR, EST AFRICAN AMERICAN: 51 mL/min — AB (ref >60)
Glucose, Bld: 92 mg/dL (ref 70–99)
Potassium: 4.7 mmol/L (ref 3.5–5.1)
SODIUM: 139 mmol/L (ref 135–145)

## 2017-08-27 LAB — MAGNESIUM: MAGNESIUM: 1.8 mg/dL (ref 1.7–2.4)

## 2017-08-27 MED ORDER — SACUBITRIL-VALSARTAN 24-26 MG PO TABS
1.0000 | ORAL_TABLET | Freq: Two times a day (BID) | ORAL | Status: DC
  Administered 2017-08-27 – 2017-08-29 (×5): 1 via ORAL
  Filled 2017-08-27 (×6): qty 1

## 2017-08-27 MED ORDER — MAGNESIUM OXIDE 400 (241.3 MG) MG PO TABS: 400.0000 mg | ORAL_TABLET | Freq: Every day | ORAL | Status: DC

## 2017-08-27 MED ORDER — DOFETILIDE 250 MCG PO CAPS
250.0000 ug | ORAL_CAPSULE | Freq: Two times a day (BID) | ORAL | Status: DC
  Administered 2017-08-27 – 2017-08-30 (×6): 250 ug via ORAL
  Filled 2017-08-27: qty 14
  Filled 2017-08-27 (×6): qty 1

## 2017-08-27 MED ORDER — SODIUM CHLORIDE 0.9% FLUSH
3.0000 mL | Freq: Two times a day (BID) | INTRAVENOUS | Status: DC
  Administered 2017-08-27 – 2017-08-30 (×5): 3 mL via INTRAVENOUS

## 2017-08-27 MED ORDER — OFF THE BEAT BOOK
Freq: Once | Status: AC
  Administered 2017-08-27: 18:00:00
  Filled 2017-08-27 (×2): qty 1

## 2017-08-27 MED ORDER — MAGNESIUM SULFATE IN D5W 1-5 GM/100ML-% IV SOLN
1.0000 g | Freq: Once | INTRAVENOUS | Status: AC
  Administered 2017-08-27: 1 g via INTRAVENOUS
  Filled 2017-08-27: qty 100

## 2017-08-27 MED ORDER — DILTIAZEM HCL ER COATED BEADS 180 MG PO CP24
180.0000 mg | ORAL_CAPSULE | Freq: Two times a day (BID) | ORAL | Status: DC
  Administered 2017-08-27 – 2017-08-29 (×5): 180 mg via ORAL
  Filled 2017-08-27 (×6): qty 1

## 2017-08-27 MED ORDER — SODIUM CHLORIDE 0.9% FLUSH: 3.0000 mL | INTRAVENOUS | Status: DC | PRN

## 2017-08-27 MED ORDER — METOPROLOL SUCCINATE ER 100 MG PO TB24
100.0000 mg | ORAL_TABLET | Freq: Two times a day (BID) | ORAL | Status: DC
  Administered 2017-08-27 – 2017-08-29 (×5): 100 mg via ORAL
  Filled 2017-08-27 (×6): qty 1

## 2017-08-27 MED ORDER — SODIUM CHLORIDE 0.9 % IV SOLN: 250.0000 mL | INTRAVENOUS | Status: DC | PRN

## 2017-08-27 MED ORDER — RIVAROXABAN 20 MG PO TABS
20.0000 mg | ORAL_TABLET | Freq: Every day | ORAL | Status: DC
  Administered 2017-08-28 – 2017-08-29 (×2): 20 mg via ORAL
  Filled 2017-08-27 (×3): qty 1

## 2017-08-27 NOTE — Progress Notes (Signed)
Primary Care Physician: Patrick Merritts, MD [name] [name] is a 67 y.o. male with a history of persistent atrial fibrillation who presents for follow up in the [facility].  Since last being seen in clinic, the patient reports doing relatively well.  Today, he  denies symptoms of palpitations, chest pain, shortness of breath, orthopnea, PND, lower extremity edema, dizziness, presyncope, syncope, snoring, daytime somnolence, bleeding, or neurologic sequela. The patient is tolerating medications without difficulties and is otherwise without complaint today.    Atrial Fibrillation Risk Factors:  he does not have symptoms or diagnosis of sleep apnea.  he does not have a history of rheumatic fever.  he does have a history of alcohol use. He has stopped drinking.   he has a BMI of Body mass index is 24.49 kg/m.Marland Kitchen Filed Weights   08/27/17 0900  Weight: 185 lb 9.6 oz (84.2 kg)    LA size: 36   Atrial Fibrillation Management history:  Previous antiarrhythmic drugs: none  Previous cardioversions: none  Previous ablations: none  CHADS2VASC score: 2  Anticoagulation history: Xarelto   Past Medical History:  Diagnosis Date  . Alcohol abuse   . Chronic systolic (congestive) heart failure ([place])    a. TTE 8/17: EF 20-25%, mild conc LVH, mild MR, mild biatrial enlarge, mild-mod TR, PASP 45, aortic root 39 mm, small posterior pericardial effusion, Afib with RVR w/ HR 120-150 bpm; b. TTE 6/18: EF 60-65%, mod LVH, mild MR, mild biatrial enlarge, mildly dilated RV, midlly reduced RVSF, mild-mod TR  . Essential hypertension   . Persistent atrial fibrillation (HCC)    a. on Xarelto; b. CHADS2VASc 3 (CHF, HTN, age x 1)  . Skin cancer    surgery to remove from the face   Past Surgical History:  Procedure Laterality Date  . KNEE SURGERY     arthroscopic x 3  . TONSILLECTOMY      Current  Outpatient Medications  Medication Sig Dispense Refill  . diltiazem (CARDIZEM CD) 180 MG 24 hr capsule Take 1 capsule (180 mg total) by mouth 2 (two) times daily. 60 capsule 6  . magnesium oxide (MAG-OX) 400 MG tablet Take 1 tablet (400 mg total) by mouth daily. 30 tablet 3  . metoprolol succinate (TOPROL-XL) 100 MG 24 hr tablet Take 1 tablet (100 mg total) by mouth 2 (two) times daily. 180 tablet 3  . sacubitril-valsartan (ENTRESTO) 24-26 MG Take 1 tablet by mouth 2 (two) times daily. 180 tablet 3  . XARELTO 20 MG TABS tablet TAKE 1 TABLET BY MOUTH DAILY WITH SUPPER 30 tablet 3   No current facility-administered medications for this encounter.     No Known Allergies  Social History   Socioeconomic History  . Marital status: Married    Spouse name: Not on file  . Number of children: Not on file  . Years of education: Not on file  . Highest education level: Not on file  Occupational History  . Not on file  Social Needs  . Financial resource strain: Not on file  . Food insecurity:    Worry: Not on file    Inability: Not on file  . Transportation needs:    Medical: Not on file    Non-medical: Not on file  Tobacco Use  . Smoking status: Never Smoker  . Smokeless tobacco: Never Used  Substance and Sexual Activity  . Alcohol use: No    Alcohol/week: 4.2 oz  Types: 7 Standard drinks or equivalent per week    Comment: daily  . Drug use: No  . Sexual activity: Not on file  Lifestyle  . Physical activity:    Days per week: Not on file    Minutes per session: Not on file  . Stress: Not on file  Relationships  . Social connections:    Talks on phone: Not on file    Gets together: Not on file    Attends religious service: Not on file    Active member of club or organization: Not on file    Attends meetings of clubs or organizations: Not on file    Relationship status: Not on file  . Intimate partner violence:    Fear of current or ex partner: Not on file    Emotionally  abused: Not on file    Physically abused: Not on file    Forced sexual activity: Not on file  Other Topics Concern  . Not on file  Social History Narrative  . Not on file    Family History  Problem Relation Age of Onset  . Dementia Mother   . Heart disease Father   . Heart attack Father     ROS- All systems are reviewed and negative except as per the HPI above.  Physical Exam: Vitals:   08/27/17 0900  BP: 108/62  Pulse: 89  Weight: 185 lb 9.6 oz (84.2 kg)  Height: 6\' 1"  (1.854 m)    GEN- The patient is well appearing, alert and oriented x 3 today.   Head- normocephalic, atraumatic Eyes-  Sclera clear, conjunctiva pink Ears- hearing intact Oropharynx- clear Neck- supple  Lungs- Clear to ausculation bilaterally, normal work of breathing Heart- Irregular rate and rhythm  GI- soft, NT, ND, + BS Extremities- no clubbing, cyanosis, or edema MS- no significant deformity or atrophy Skin- no rash or lesion Psych- euthymic mood, full affect Neuro- strength and sensation are intact  Wt Readings from Last 3 Encounters:  08/27/17 185 lb 9.6 oz (84.2 kg)  08/14/17 188 lb 9.6 oz (85.5 kg)  07/26/17 182 lb (82.6 kg)    EKG today demonstrates atrial fibrillation, rate 89, QTc 456msec manually  Echo 07/2017 demonstrated EF 55-60%, no RWMA, LA 36  Epic records are reviewed at length today  Assessment and Plan:  1. Persistent atrial fibrillation Per Dr Patrick Roberts, plan for Tikosyn admission today QTc 456msec by manual calculation BMET, Mg drawn this morning Continue Xarelto for CHADS2VASC of 2 - he reports no missed doses in last 3 weeks CrCl 67.68  2. NICM - resolved Previously related to ETOH No longer drinks Continue current meds EF normalized  3.  HTN Stable No change required today   Patrick Marshall, NP 08/27/2017 9:11 AM

## 2017-08-27 NOTE — Progress Notes (Signed)
Pharmacy Review for Dofetilide (Tikosyn) Initiation  Admit Complaint: 67 y.o. male admitted 08/27/2017 with atrial fibrillation to be initiated on dofetilide.   Assessment:  Patient Exclusion Criteria: If any screening criteria checked as "Yes", then  patient  should NOT receive dofetilide until criteria item is corrected. If "Yes" please indicate correction plan.  YES  NO Patient  Exclusion Criteria Correction Plan  [x]  []  Baseline QTc interval is greater than or equal to 440 msec. IF above YES box checked dofetilide contraindicated unless patient has ICD; then may proceed if QTc 500-550 msec or with known ventricular conduction abnormalities may proceed with QTc 550-600 msec. QTc =  478 (measured 360, 438 by EP today) Ok per EP measurement  []  [x]  Magnesium level is less than 1.8 mEq/l : Last magnesium:  Lab Results  Component Value Date   MG 1.8 08/27/2017       On magox 400mg  daily PTA, replacing Mag sulfate 1g IV x 1  []  [x]  Potassium level is less than 4 mEq/l : Last potassium:  Lab Results  Component Value Date   K 4.7 08/27/2017         []  [x]  Patient is known or suspected to have a digoxin level greater than 2 ng/ml: Lab Results  Component Value Date   DIGOXIN <0.2 (L) 07/26/2017      []  [x]  Creatinine clearance less than 20 ml/min (calculated using Cockcroft-Gault, actual body weight and serum creatinine): Estimated Creatinine Clearance: 50.9 mL/min (A) (by C-G formula based on SCr of 1.57 mg/dL (H)).    []  [x]  Patient has received drugs known to prolong the QT intervals within the last 48 hours (phenothiazines, tricyclics or tetracyclic antidepressants, erythromycin, H-1 antihistamines, cisapride, fluoroquinolones, azithromycin). Drugs not listed above may have an, as yet, undetected potential to prolong the QT interval, updated information on QT prolonging agents is available at this website:QT prolonging agents   []  [x]  Patient received a dose of hydrochlorothiazide  (Oretic) alone or in any combination including triamterene (Dyazide, Maxzide) in the last 48 hours.   []  [x]  Patient received a medication known to increase dofetilide plasma concentrations prior to initial dofetilide dose:  . Trimethoprim (Primsol, Proloprim) in the last 36 hours . Verapamil (Calan, Verelan) in the last 36 hours or a sustained release dose in the last 72 hours . Megestrol (Megace) in the last 5 days  . Cimetidine (Tagamet) in the last 6 hours . Ketoconazole (Nizoral) in the last 24 hours . Itraconazole (Sporanox) in the last 48 hours  . Prochlorperazine (Compazine) in the last 36 hours    []  [x]  Patient is known to have a history of torsades de pointes; congenital or acquired long QT syndromes.   []  [x]  Patient has received a Class 1 antiarrhythmic with less than 2 half-lives since last dose. (Disopyramide, Quinidine, Procainamide, Lidocaine, Mexiletine, Flecainide, Propafenone)   []  [x]  Patient has received amiodarone therapy in the past 3 months or amiodarone level is greater than 0.3 ng/ml.    Patient has been appropriately anticoagulated with Xarelto.  Ordering provider was confirmed at [email or web address] if they are not listed on the [place] Prescribers list.  Goal of Therapy: Follow renal function, electrolytes, potential drug interactions, and dose adjustment. Provide education and 1 week supply at discharge.  Plan:  [x]   Physician selected initial dose within range recommended for patients level of renal function - will monitor for response.  []   Physician selected initial dose outside of range recommended for patients level of  renal function - will discuss if the dose should be altered at this time.   Select One Calculated CrCl  Dose q12h  []  > 60 ml/min 500 mcg  [x]  40-60 ml/min 250 mcg  []  20-40 ml/min 125 mcg   2. Follow up QTc after the first 5 doses, renal function, electrolytes (K & Mg) daily x 3     days, dose adjustment, success of  initiation and facilitate 1 week discharge supply as     clinically indicated.  3. Initiate Tikosyn education video (Call [phone] and ask for Tikosyn Video # 116).  4. Place Enrollment Form on the chart for discharge supply of dofetilide.  Elicia Lamp, PharmD, BCPS Clinical Pharmacist Clinical phone [phone] Please check AMION for all [facility] contact numbers 08/27/2017 2:29 PM

## 2017-08-27 NOTE — Progress Notes (Signed)
Patient admitted to 6E15 from Admissions Office via w/c.  Bed in low position, wheels locked.  Patient denies chest pain/shortness of breath.  Portable telemetry monitor applied.  Patient oriented to environment, including call bell, TV, meal times, and hourly rounding.  Notified PA of patient's arrival.

## 2017-08-27 NOTE — H&P (Addendum)
Cardiology Admission History and Physical:   Patient ID: Patrick Roberts; MRN: [id number]; DOB: 08/07/1950   Admission date: 08/27/2017  Primary Care Provider: Minna Merritts, MD Primary Cardiologist: Dr. Rockey Situ Primary Electrophysiologist:  Dr. Curt Bears  Chief Complaint:  Tikosyn load  Patient Profile:   Patrick Roberts is a 67 y.o. male with a history of ETOH abuse, no longer drinking with hx of NICM with improved LVEF, HTN and persistent AFib  History of Present Illness:   Patrick Roberts comes today for initiation of Tikosyn at the recommendation of Dr. Curt Bears.  He was seen today in the AFib clinic to arrange admission.  The patient feeling well, not as aware of his AF as he has been historically.  He confirms no missed doses of his Xarelto in 3 weeks  AFib hx: Diagnosed 2017 No prior AAD hx Dig stopped may 2019 with elevated dig level (normalized LVEF) A/c w/Xarelto   Past Medical History:  Diagnosis Date  . Alcohol abuse   . Chronic systolic (congestive) heart failure ([place])    a. TTE 8/17: EF 20-25%, mild conc LVH, mild MR, mild biatrial enlarge, mild-mod TR, PASP 45, aortic root 39 mm, small posterior pericardial effusion, Afib with RVR w/ HR 120-150 bpm; b. TTE 6/18: EF 60-65%, mod LVH, mild MR, mild biatrial enlarge, mildly dilated RV, midlly reduced RVSF, mild-mod TR  . Essential hypertension   . Persistent atrial fibrillation ([facility])    a. on Xarelto; b. CHADS2VASc 3 (CHF, HTN, age x 1)  . Skin cancer    surgery to remove from the face    Past Surgical History:  Procedure Laterality Date  . KNEE SURGERY     arthroscopic x 3  . TONSILLECTOMY       Medications Prior to Admission: Prior to Admission medications   Medication Sig Start Date End Date Taking? Authorizing Provider  diltiazem (CARDIZEM CD) 180 MG 24 hr capsule Take 1 capsule (180 mg total) by mouth 2 (two) times daily. 07/27/17   Baldwin Jamaica, PA-C  magnesium oxide (MAG-OX) 400 MG tablet Take 1  tablet (400 mg total) by mouth daily. 07/27/17   Baldwin Jamaica, PA-C  metoprolol succinate (TOPROL-XL) 100 MG 24 hr tablet Take 1 tablet (100 mg total) by mouth 2 (two) times daily. 07/05/16   Minna Merritts, MD  sacubitril-valsartan (ENTRESTO) 24-26 MG Take 1 tablet by mouth 2 (two) times daily. 07/10/17   Dunn, Ryan M, PA-C  XARELTO 20 MG TABS tablet TAKE 1 TABLET BY MOUTH DAILY WITH SUPPER 08/01/17   Minna Merritts, MD     Allergies:   No Known Allergies  Social History:   Social History   Socioeconomic History  . Marital status: Married    Spouse name: Not on file  . Number of children: Not on file  . Years of education: Not on file  . Highest education level: Not on file  Occupational History  . Not on file  Social Needs  . Financial resource strain: Not on file  . Food insecurity:    Worry: Not on file    Inability: Not on file  . Transportation needs:    Medical: Not on file    Non-medical: Not on file  Tobacco Use  . Smoking status: Never Smoker  . Smokeless tobacco: Never Used  Substance and Sexual Activity  . Alcohol use: No    Alcohol/week: 4.2 oz    Types: 7 Standard drinks or equivalent per week  Comment: daily  . Drug use: No  . Sexual activity: Not on file  Lifestyle  . Physical activity:    Days per week: Not on file    Minutes per session: Not on file  . Stress: Not on file  Relationships  . Social connections:    Talks on phone: Not on file    Gets together: Not on file    Attends religious service: Not on file    Active member of club or organization: Not on file    Attends meetings of clubs or organizations: Not on file    Relationship status: Not on file  . Intimate partner violence:    Fear of current or ex partner: Not on file    Emotionally abused: Not on file    Physically abused: Not on file    Forced sexual activity: Not on file  Other Topics Concern  . Not on file  Social History Narrative  . Not on file    Family  History:  The patient's family history includes Dementia in his mother; Heart attack in his father; Heart disease in his father.    ROS:  Please see the history of present illness.  All other ROS reviewed and negative.     Physical Exam/Data:   Vitals:   08/27/17 1228  BP: 132/90  Pulse: (!) 101  Resp: 15  Temp: 97.6 F (36.4 C)  TempSrc: Oral  SpO2: 100%   No intake or output data in the 24 hours ending 08/27/17 1333 There were no vitals filed for this visit. There is no height or weight on file to calculate BMI.  General:  Well nourished, well developed, in no acute distress HEENT: normal Lymph: no adenopathy Neck: no JVD Endocrine:  No thryomegaly Vascular: No carotid bruits  Cardiac:  irreg-irreg; no murmurs, gallops or rubs Lungs:  CTA b/l, no wheezing, rhonchi or rales  Abd: soft, nontender  Ext: no edema Musculoskeletal:  No deformities Skin: warm and dry  Neuro:  no gross focal abnormalities noted Psych:  Normal affect    EKG:  The ECG that was done today was personally reviewed and demonstrates  AFib 89bpm, I measure QT at 365ms, corrects to 457ms   Relevant CV Studies:  07/16/17: TTE Study Conclusions - Left ventricle: The cavity size was normal. There was mild   concentric hypertrophy. Systolic function was normal. The   estimated ejection fraction was in the range of 55% to 60%. Wall   motion was normal; there were no regional wall motion   abnormalities. The study was not technically sufficient to allow   evaluation of LV diastolic dysfunction due to atrial   fibrillation. - Mitral valve: There was mild regurgitation. - Left atrium: The atrium was mildly dilated. - Pulmonary arteries: Systolic pressure was within the normal   range. Impressions: - Tachycardia noted during the study with HR: 120-140 bpm.  (LA 31mm)  Laboratory Data:  Chemistry Recent Labs  Lab 08/27/17 0855  NA 139  K 4.7  CL 106  CO2 23  GLUCOSE 92  BUN 20    CREATININE 1.57*  CALCIUM 9.3  GFRNONAA 44*  GFRAA 51*  ANIONGAP 10    No results for input(s): PROT, ALBUMIN, AST, ALT, ALKPHOS, BILITOT in the last 168 hours. HematologyNo results for input(s): WBC, RBC, HGB, HCT, MCV, MCH, MCHC, RDW, PLT in the last 168 hours. Cardiac EnzymesNo results for input(s): TROPONINI in the last 168 hours. No results for input(s): TROPIPOC in  the last 168 hours.  BNPNo results for input(s): BNP, PROBNP in the last 168 hours.  DDimer No results for input(s): DDIMER in the last 168 hours.  Radiology/Studies:  No results found.  Assessment and Plan:   1. Persistent AFib     CHA2DS2Vasc is 3, on Xarelto     K+ 4.7     Mag 1.8, I will give additional replacement, he took his usual 400mg  daily dose today after his lab draw, though has been chronically on it.  No need to recheck prior to starting drug this evening     Creat 1.57 (Cal CrCl is 53)     QT measured by myself is 377ms, QTc 437ms  Start at 262mcg BID given creat clearance. Historically has been better.  I encouraged PO intake Plan DCCV Wed if not in SR, d/w patient, he is agreeable   2. Hx of NICM     Recovered LVEF  3. HTN       Continue home regime     For questions or updates, please contact [place] Please consult [email or web address] for contact info under Cardiology/STEMI.    Signed, Baldwin Jamaica, PA-C  08/27/2017 1:33 PM   EP Attending  Patient seen and examined. Agree with the findings as noted above. The patient presents with persistent atrial fib. He will be admitted for initiation of dofetilide and then DCCV. We will follow his systemic anti-coagulation. His renal function is down a bit from baseline and for this reason we will start a lower dose of dofetilide.  Mikle Bosworth.D.

## 2017-08-28 ENCOUNTER — Encounter (HOSPITAL_COMMUNITY): Payer: Self-pay

## 2017-08-28 LAB — BASIC METABOLIC PANEL
Anion gap: 10 (ref 5–15)
BUN: 20 mg/dL (ref 8–23)
CHLORIDE: 103 mmol/L (ref 98–111)
CO2: 24 mmol/L (ref 22–32)
Calcium: 9.3 mg/dL (ref 8.9–10.3)
Creatinine, Ser: 1.45 mg/dL — ABNORMAL HIGH (ref 0.61–1.24)
GFR calc non Af Amer: 48 mL/min — ABNORMAL LOW (ref >60)
GFR, EST AFRICAN AMERICAN: 56 mL/min — AB (ref >60)
Glucose, Bld: 109 mg/dL — ABNORMAL HIGH (ref 70–99)
POTASSIUM: 4.6 mmol/L (ref 3.5–5.1)
SODIUM: 137 mmol/L (ref 135–145)

## 2017-08-28 LAB — MAGNESIUM: Magnesium: 1.9 mg/dL (ref 1.7–2.4)

## 2017-08-28 MED ORDER — SODIUM CHLORIDE 0.9 % IV SOLN: 250.0000 mL | INTRAVENOUS | Status: DC

## 2017-08-28 MED ORDER — HYDROCORTISONE 1 % EX CREA
1.0000 "application " | TOPICAL_CREAM | Freq: Three times a day (TID) | CUTANEOUS | Status: DC | PRN
  Filled 2017-08-28: qty 28

## 2017-08-28 MED ORDER — SODIUM CHLORIDE 0.9% FLUSH
3.0000 mL | Freq: Two times a day (BID) | INTRAVENOUS | Status: DC
  Administered 2017-08-28 – 2017-08-29 (×3): 3 mL via INTRAVENOUS

## 2017-08-28 MED ORDER — SODIUM CHLORIDE 0.9% FLUSH: 3.0000 mL | INTRAVENOUS | Status: DC | PRN

## 2017-08-28 MED ORDER — MAGNESIUM OXIDE 400 (241.3 MG) MG PO TABS
400.0000 mg | ORAL_TABLET | Freq: Two times a day (BID) | ORAL | Status: DC
  Administered 2017-08-28 – 2017-08-30 (×5): 400 mg via ORAL
  Filled 2017-08-28 (×5): qty 1

## 2017-08-28 NOTE — Progress Notes (Addendum)
Progress Note  Patient Name: Patrick Roberts Date of Encounter: 08/28/2017  Primary Cardiologist: Dr. Rockey Situ EP; Dr. Curt Bears  Subjective   No complaints  Inpatient Medications    Scheduled Meds: . diltiazem  180 mg Oral BID  . dofetilide  250 mcg Oral BID  . magnesium oxide  400 mg Oral Daily  . metoprolol succinate  100 mg Oral BID  . rivaroxaban  20 mg Oral Q supper  . sacubitril-valsartan  1 tablet Oral BID  . sodium chloride flush  3 mL Intravenous Q12H   Continuous Infusions: . sodium chloride     PRN Meds: sodium chloride, sodium chloride flush   Vital Signs    Vitals:   08/27/17 1228 08/27/17 1958 08/28/17 0624 08/28/17 0723  BP: 132/90 (!) 135/101 93/73 107/76  Pulse: (!) 101 84 73   Resp: 15 18    Temp: 97.6 F (36.4 C) (!) 97.4 F (36.3 C) 97.8 F (36.6 C)   TempSrc: Oral Oral Oral   SpO2: 100% 99% 99%   Weight:   173 lb 6.4 oz (78.7 kg)     Intake/Output Summary (Last 24 hours) at 08/28/2017 0724 Last data filed at 08/27/2017 1758 Gross per 24 hour  Intake 700.12 ml  Output -  Net 700.12 ml   Filed Weights   08/28/17 0624  Weight: 173 lb 6.4 oz (78.7 kg)    Telemetry   AFib 8';s - Personally Reviewed  ECG    AFib 100bpm, reviewed with Dr. Lovena Le, measured QT 379ms, QTC 470  Physical Exam   GEN: No acute distress, ambulating in room comfortably Neck: No JVD Cardiac: irreg-irreg, no murmurs, rubs, or gallops.  Respiratory: CTA b/l. GI: Soft, nontender, non-distended  MS: No edema; No deformity. Neuro:  Nonfocal  Psych: Normal affect   Labs    Chemistry Recent Labs  Lab 08/27/17 0855  NA 139  K 4.7  CL 106  CO2 23  GLUCOSE 92  BUN 20  CREATININE 1.57*  CALCIUM 9.3  GFRNONAA 44*  GFRAA 51*  ANIONGAP 10     HematologyNo results for input(s): WBC, RBC, HGB, HCT, MCV, MCH, MCHC, RDW, PLT in the last 168 hours.  Cardiac EnzymesNo results for input(s): TROPONINI in the last 168 hours. No results for input(s): TROPIPOC  in the last 168 hours.   BNPNo results for input(s): BNP, PROBNP in the last 168 hours.   DDimer No results for input(s): DDIMER in the last 168 hours.   Radiology    No results found.  Cardiac Studies   07/16/17: TTE Study Conclusions - Left ventricle: The cavity size was normal. There was mild concentric hypertrophy. Systolic function was normal. The estimated ejection fraction was in the range of 55% to 60%. Wall motion was normal; there were no regional wall motion abnormalities. The study was not technically sufficient to allow evaluation of LV diastolic dysfunction due to atrial fibrillation. - Mitral valve: There was mild regurgitation. - Left atrium: The atrium was mildly dilated. - Pulmonary arteries: Systolic pressure was within the normal range. Impressions: - Tachycardia noted during the study with HR: 120-140 bpm.  (LA 26mm)  Patient Profile     67 y.o. male with a history of ETOH abuse, no longer drinking with hx of NICM with improved LVEF, HTN and persistent AFib, admitted for Tiksoyn load  AFib hx: Diagnosed 2017 No prior AAD hx Dig stopped may 2019 with elevated dig level (normalized LVEF) A/c w/Xarelto    Assessment &  Plan    1. Persistent AFib     CHA2DS2Vasc is 3, on Xarelto     K+ 4.6     Mag 1.9, increase to 400mg  BID     Creat 1.45     QT OK to continue load   Plan DCCV Wed if not in SR, d/w patient, he is agreeable   2. Hx of NICM     Recovered LVEF     no exam findings to suggest fluid OL  3. HTN       Continue home regime    For questions or updates, please contact [place] Please consult [email or web address] for contact info under Cardiology/STEMI.      Signed, Baldwin Jamaica, PA-C  08/28/2017, 7:24 AM    EP Attending  Patient seen and examined. Agree with above. He is stable this morning. We will start dofetilide now that potassium ok. DCCV on Wednesday unless he reverts to NSR.  Mikle Bosworth.D.

## 2017-08-28 NOTE — Care Management Note (Addendum)
Case Management Note  Patient Details  Name: Patrick Roberts MRN: [id number] Date of Birth: May 24, 1950  Subjective/Objective:  Pt presented for Tikosyn Load: Persistent Atrial Fib. Benefits check for Tikosyn in process- CM will make patient aware of cost once completed.                  Action/Plan: CM will assist with the Rx for the 7 day supply no refills via [facility] and pt will need an additional Rx with refills. CM will monitor for additional disposition needs.    Expected Discharge Date:                  Expected Discharge Plan:  Home/Self Care  In-House Referral:  NA  Discharge planning Services  CM Consult, Medication Assistance  Post Acute Care Choice:  NA Choice offered to:  NA  DME Arranged:  N/A DME Agency:  NA  HH Arranged:  NA HH Agency:  NA  Status of Service:  Completed, signed off  If discussed at [place] of Stay Meetings, dates discussed:    Additional Comments: 1532 08-29-17 Jacqlyn Krauss, RN, BSN [phone] CM did speak with patient in regards to co pay. Pt uses [facility] in [place]. Pt wants a Rx for 30 day supply once he transitions home. No further needs from CM at this time.    Co-pay amount:Dofetilide (TIKOSYN) capsule 250 mcg, 500 mcg, 125 mcg  Frequency: 2 times daily  Local Pharmacy 30 day supply: 265mcg $121.82 ,552mcg $112.79, 125 mcg $115.64.  In [place] are: [facility].  No PA required.   Mail Order Pharmacy 90 day supply: for 200,500,125 mcg $324.47.   No PA required.  Reginia Naas, RN 08/28/2017, 12:05 PM

## 2017-08-29 ENCOUNTER — Other Ambulatory Visit: Payer: Self-pay

## 2017-08-29 ENCOUNTER — Encounter (HOSPITAL_COMMUNITY)
Admission: RE | Disposition: A | Discharge status: Home / Self Care | Payer: Self-pay | Source: Ambulatory Visit | Attending: Cardiology

## 2017-08-29 ENCOUNTER — Ambulatory Visit (HOSPITAL_COMMUNITY): Admit: 2017-08-29 | Payer: Medicare Other | Admitting: Cardiology

## 2017-08-29 ENCOUNTER — Encounter (HOSPITAL_COMMUNITY): Payer: Self-pay | Admitting: General Practice

## 2017-08-29 LAB — BASIC METABOLIC PANEL
ANION GAP: 10 (ref 5–15)
BUN: 21 mg/dL (ref 8–23)
CHLORIDE: 101 mmol/L (ref 98–111)
CO2: 26 mmol/L (ref 22–32)
Calcium: 9.4 mg/dL (ref 8.9–10.3)
Creatinine, Ser: 1.39 mg/dL — ABNORMAL HIGH (ref 0.61–1.24)
GFR calc non Af Amer: 51 mL/min — ABNORMAL LOW (ref >60)
GFR, EST AFRICAN AMERICAN: 59 mL/min — AB (ref >60)
Glucose, Bld: 100 mg/dL — ABNORMAL HIGH (ref 70–99)
POTASSIUM: 4.8 mmol/L (ref 3.5–5.1)
SODIUM: 137 mmol/L (ref 135–145)

## 2017-08-29 LAB — MAGNESIUM: Magnesium: 2 mg/dL (ref 1.7–2.4)

## 2017-08-29 SURGERY — CARDIOVERSION
Anesthesia: General

## 2017-08-29 NOTE — Progress Notes (Addendum)
Progress Note  Patient Name: Patrick Roberts Date of Encounter: 08/29/2017  Primary Cardiologist: Dr. Rockey Situ EP; Dr. Curt Bears  Subjective   No complaints, thinks he can tell the difference, feels a bit better yesterday today with SR  Inpatient Medications    Scheduled Meds: . diltiazem  180 mg Oral BID  . dofetilide  250 mcg Oral BID  . magnesium oxide  400 mg Oral BID  . metoprolol succinate  100 mg Oral BID  . rivaroxaban  20 mg Oral Q supper  . sacubitril-valsartan  1 tablet Oral BID  . sodium chloride flush  3 mL Intravenous Q12H  . sodium chloride flush  3 mL Intravenous Q12H   Continuous Infusions: . sodium chloride    . sodium chloride     PRN Meds: sodium chloride, hydrocortisone cream, sodium chloride flush, sodium chloride flush   Vital Signs    Vitals:   08/28/17 1436 08/28/17 2055 08/28/17 2320 08/29/17 0402  BP: 106/71 96/71  102/80  Pulse: 65 72 72 81  Resp:  18 18 18   Temp: 97.7 F (36.5 C) 97.9 F (36.6 C) 97.9 F (36.6 C) 98.4 F (36.9 C)  TempSrc: Oral Oral Oral Oral  SpO2: 100% 100%  98%  Weight:   173 lb (78.5 kg) 180 lb 8 oz (81.9 kg)  Height:   6\' 1"  (1.854 m)     Intake/Output Summary (Last 24 hours) at 08/29/2017 0800 Last data filed at 08/28/2017 2200 Gross per 24 hour  Intake 963 ml  Output 2 ml  Net 961 ml   Filed Weights   08/28/17 0624 08/28/17 2320 08/29/17 0402  Weight: 173 lb 6.4 oz (78.7 kg) 173 lb (78.5 kg) 180 lb 8 oz (81.9 kg)    Telemetry   SR 70's-90's - Personally Reviewed  ECG    SR 71bpm, measured QT is 460ms, corrects 411ms, reviewed with Dr. Lovena Le  Physical Exam   GEN: No acute distress, ambulating in room comfortably Neck: No JVD Cardiac: RRR, no murmurs, rubs, or gallops.  Respiratory: CTA b/l. GI: Soft, nontender, non-distended  MS: No edema; No deformity. Neuro:  Nonfocal  Psych: Normal affect   Labs    Chemistry Recent Labs  Lab 08/27/17 0855 08/28/17 0646 08/29/17 0356  NA 139 137  137  K 4.7 4.6 4.8  CL 106 103 101  CO2 23 24 26   GLUCOSE 92 109* 100*  BUN 20 20 21   CREATININE 1.57* 1.45* 1.39*  CALCIUM 9.3 9.3 9.4  GFRNONAA 44* 48* 51*  GFRAA 51* 56* 59*  ANIONGAP 10 10 10      HematologyNo results for input(s): WBC, RBC, HGB, HCT, MCV, MCH, MCHC, RDW, PLT in the last 168 hours.  Cardiac EnzymesNo results for input(s): TROPONINI in the last 168 hours. No results for input(s): TROPIPOC in the last 168 hours.   BNPNo results for input(s): BNP, PROBNP in the last 168 hours.   DDimer No results for input(s): DDIMER in the last 168 hours.   Radiology    No results found.  Cardiac Studies   07/16/17: TTE Study Conclusions - Left ventricle: The cavity size was normal. There was mild concentric hypertrophy. Systolic function was normal. The estimated ejection fraction was in the range of 55% to 60%. Wall motion was normal; there were no regional wall motion abnormalities. The study was not technically sufficient to allow evaluation of LV diastolic dysfunction due to atrial fibrillation. - Mitral valve: There was mild regurgitation. - Left atrium:  The atrium was mildly dilated. - Pulmonary arteries: Systolic pressure was within the normal range. Impressions: - Tachycardia noted during the study with HR: 120-140 bpm.  (LA 10mm)  Patient Profile     67 y.o. male with a history of ETOH abuse, no longer drinking with hx of NICM with improved LVEF, HTN and persistent AFib, admitted for Tiksoyn load  AFib hx: Diagnosed 2017 No prior AAD hx Dig stopped may 2019 with elevated dig level (normalized LVEF) A/c w/Xarelto    Assessment & Plan    1. Persistent AFib     CHA2DS2Vasc is 3, on Xarelto     K+ 4.8     Mag 2.0     Creat 139, stable     converted to SR w/drug, QT OK to continue load   2. Hx of NICM     Recovered LVEF     no exam findings to suggest fluid OL  3. HTN       Continue home regime    For questions or  updates, please contact [place] Please consult [email or web address] for contact info under Cardiology/STEMI.      Signed, Baldwin Jamaica, PA-C  08/29/2017, 8:00 AM    EP Attending  Patient seen and examined. Agree with above. He is doing well today and has reverted back to NSR on dofetilide. His QT is borderline. We will continue and follow. DC home tomorrow if stable.   Mikle Bosworth.D.

## 2017-08-30 LAB — BASIC METABOLIC PANEL
Anion gap: 11 (ref 5–15)
BUN: 20 mg/dL (ref 8–23)
CALCIUM: 9.2 mg/dL (ref 8.9–10.3)
CHLORIDE: 104 mmol/L (ref 98–111)
CO2: 23 mmol/L (ref 22–32)
CREATININE: 1.33 mg/dL — AB (ref 0.61–1.24)
GFR, EST NON AFRICAN AMERICAN: 53 mL/min — AB (ref >60)
Glucose, Bld: 105 mg/dL — ABNORMAL HIGH (ref 70–99)
Potassium: 4.2 mmol/L (ref 3.5–5.1)
SODIUM: 138 mmol/L (ref 135–145)

## 2017-08-30 LAB — MAGNESIUM: Magnesium: 1.9 mg/dL (ref 1.7–2.4)

## 2017-08-30 MED ORDER — DOFETILIDE 250 MCG PO CAPS: 250.0000 ug | ORAL_CAPSULE | Freq: Two times a day (BID) | ORAL | 6 refills | Status: DC

## 2017-08-30 MED ORDER — MAGNESIUM OXIDE 400 (241.3 MG) MG PO TABS: 400.0000 mg | ORAL_TABLET | Freq: Two times a day (BID) | ORAL | 6 refills | Status: AC

## 2017-08-30 MED ORDER — DILTIAZEM HCL ER COATED BEADS 180 MG PO CP24: 180.0000 mg | ORAL_CAPSULE | Freq: Every day | ORAL | 6 refills | Status: DC

## 2017-08-30 MED ORDER — METOPROLOL SUCCINATE ER 100 MG PO TB24: 100.0000 mg | ORAL_TABLET | Freq: Every day | ORAL | 6 refills | Status: DC

## 2017-08-30 NOTE — Progress Notes (Addendum)
EKG/tele reviewed.  Remains in SR QT stable Labs OK Anticipate discharge after dose/EKG this AM  Tommye Standard, PA-C  EP Attending  Pt seen and examined. Agree with above. He is stable for DC. Usual followup.  Mikle Bosworth.D.

## 2017-08-30 NOTE — Progress Notes (Signed)
Discharge instruction was given to pt.  7 days supply of Tikosyn was given to pt.  Idolina Primer, RN

## 2017-08-30 NOTE — Discharge Instructions (Signed)

## 2017-08-30 NOTE — Discharge Summary (Addendum)
ELECTROPHYSIOLOGY PROCEDURE DISCHARGE SUMMARY    Patient ID: Patrick Roberts,  MRN: [id number], DOB/AGE: 06/10/1950 67 y.o.  Admit date: 08/27/2017 Discharge date: 08/30/2017  Primary Care Physician: Minna Merritts, MD  Primary Cardiologist: Dr. Rockey Situ Electrophysiologist: Dr. Curt Bears  Primary Discharge Diagnosis:  1.  Persistent atrial fibrillation status post Tikosyn loading this admission  Secondary Discharge Diagnosis:  1. NICM (Hx) 2. HTN  No Known Allergies   Procedures This Admission:  1.  Tikosyn loading   Brief HPI: Patrick Roberts is a 67 y.o. male with a past medical history as noted above.  I followed by EP and the AFib clinic in the outpatient setting for treatment options of atrial fibrillation.  Risks, benefits, and alternatives to Tikosyn were reviewed with the patient who wished to proceed.    Hospital Course:  The patient was admitted and Tikosyn was initiated.  Renal function and electrolytes were followed during the hospitalization.  We will continue him on BID mag replacement.  Will reduce his diltiazem and metoprolol to once daily.  HR in SR have been 60's, BP lower in SR as well.  His dose initiated at 248mcg for renal function and maintained, his QTc remained stable.  He converted with drug and did not require DCCV.  He was monitored until discharge on telemetry which demonstrated SR.  On the day of discharge, he was examined by Dr Lovena Le who considered the patient stable for discharge to home.  Follow-up has been arranged with the AFib clinic in 1 week and with Dr Curt Bears in 4 weeks.   Physical Exam: Vitals:   08/30/17 0832 08/30/17 0833 08/30/17 0834 08/30/17 1036  BP: (!) 87/70 (!) 89/78 (!) 89/78 97/71  Pulse:      Resp:      Temp:      TempSrc:      SpO2:      Weight:      Height:        GEN- The patient is well appearing, alert and oriented x 3 today.   HEENT: normocephalic, atraumatic; sclera clear, conjunctiva pink; hearing intact;  oropharynx clear; neck supple, no JVP Lymph- no cervical lymphadenopathy Lungs- CTA b/l, normal work of breathing.  No wheezes, rales, rhonchi Heart- RRR, no murmurs, rubs or gallops, PMI not laterally displaced GI- soft, non-tender, non-distended, Extremities- no clubbing, cyanosis, or edema MS- no significant deformity or atrophy Skin- warm and dry, no rash or lesion Psych- euthymic mood, full affect Neuro- strength and sensation are intact   Labs:   Lab Results  Component Value Date   WBC 8.0 07/26/2017   HGB 14.1 07/26/2017   HCT 43.0 07/26/2017   MCV 103.1 (H) 07/26/2017   PLT 164 07/26/2017    Recent Labs  Lab 08/30/17 0402  NA 138  K 4.2  CL 104  CO2 23  BUN 20  CREATININE 1.33*  CALCIUM 9.2  GLUCOSE 105*     Discharge Medications:  Allergies as of 08/30/2017   No Known Allergies     Medication List    STOP taking these medications   magnesium oxide 400 MG tablet Commonly known as:  MAG-OX Replaced by:  magnesium oxide 400 (241.3 Mg) MG tablet     TAKE these medications   diltiazem 180 MG 24 hr capsule Commonly known as:  CARDIZEM CD Take 1 capsule (180 mg total) by mouth daily. What changed:  when to take this   dofetilide 250 MCG capsule Commonly known as:  TIKOSYN Take 1 capsule (250 mcg total) by mouth 2 (two) times daily.   magnesium oxide 400 (241.3 Mg) MG tablet Commonly known as:  MAG-OX Take 1 tablet (400 mg total) by mouth 2 (two) times daily. Replaces:  magnesium oxide 400 MG tablet   metoprolol succinate 100 MG 24 hr tablet Commonly known as:  TOPROL-XL Take 1 tablet (100 mg total) by mouth daily. What changed:  when to take this   sacubitril-valsartan 24-26 MG Commonly known as:  ENTRESTO Take 1 tablet by mouth 2 (two) times daily.   XARELTO 20 MG Tabs tablet Generic drug:  rivaroxaban TAKE 1 TABLET BY MOUTH DAILY WITH SUPPER What changed:  See the new instructions.       Disposition:  Home Discharge Instructions     Diet - low sodium heart healthy   Complete by:  As directed    Increase activity slowly   Complete by:  As directed      Follow-up Information    [facility] Follow up on 09/06/2017.   Specialty:  Cardiology Why:  9:30AM Contact information: [address] [id number] [facility] [address] [place]       Constance Haw, MD Follow up on 10/03/2017.   Specialty:  Cardiology Why:  11:00AM Contact information: [place] [address] [phone]           Duration of Discharge Encounter: Greater than 30 minutes including physician time.  Venetia Night, PA-C 08/30/2017 1:07 PM  EP Attending  Patient seen and examined. Agree with above. The patient is maintaining NSR and his QT interval is stable. He will be discharged home with the usual followup.  Mikle Bosworth.D.

## 2017-09-06 ENCOUNTER — Encounter (HOSPITAL_COMMUNITY): Payer: Self-pay | Admitting: Nurse Practitioner

## 2017-09-06 ENCOUNTER — Ambulatory Visit (HOSPITAL_COMMUNITY)
Admission: RE | Admit: 2017-09-06 | Discharge: 2017-09-06 | Disposition: A | Discharge status: Home / Self Care | Payer: Medicare Other | Source: Ambulatory Visit | Attending: Nurse Practitioner | Admitting: Nurse Practitioner

## 2017-09-06 VITALS — BP 124/80 | HR 76 | Ht 73.0 in | Wt 182.0 lb

## 2017-09-06 DIAGNOSIS — I4581 Long QT syndrome: Secondary | ICD-10-CM | POA: Diagnosis not present

## 2017-09-06 DIAGNOSIS — Z7983 Long term (current) use of bisphosphonates: Secondary | ICD-10-CM | POA: Insufficient documentation

## 2017-09-06 DIAGNOSIS — Z79899 Other long term (current) drug therapy: Secondary | ICD-10-CM | POA: Insufficient documentation

## 2017-09-06 DIAGNOSIS — Z818 Family history of other mental and behavioral disorders: Secondary | ICD-10-CM | POA: Insufficient documentation

## 2017-09-06 DIAGNOSIS — Z9889 Other specified postprocedural states: Secondary | ICD-10-CM | POA: Diagnosis not present

## 2017-09-06 DIAGNOSIS — I481 Persistent atrial fibrillation: Secondary | ICD-10-CM

## 2017-09-06 DIAGNOSIS — Z7901 Long term (current) use of anticoagulants: Secondary | ICD-10-CM | POA: Insufficient documentation

## 2017-09-06 DIAGNOSIS — I5022 Chronic systolic (congestive) heart failure: Secondary | ICD-10-CM | POA: Insufficient documentation

## 2017-09-06 DIAGNOSIS — I110 Hypertensive heart disease with heart failure: Secondary | ICD-10-CM | POA: Insufficient documentation

## 2017-09-06 DIAGNOSIS — Z85828 Personal history of other malignant neoplasm of skin: Secondary | ICD-10-CM | POA: Insufficient documentation

## 2017-09-06 DIAGNOSIS — Z09 Encounter for follow-up examination after completed treatment for conditions other than malignant neoplasm: Secondary | ICD-10-CM | POA: Insufficient documentation

## 2017-09-06 DIAGNOSIS — Z8249 Family history of ischemic heart disease and other diseases of the circulatory system: Secondary | ICD-10-CM | POA: Diagnosis not present

## 2017-09-06 DIAGNOSIS — I4819 Other persistent atrial fibrillation: Secondary | ICD-10-CM

## 2017-09-06 LAB — BASIC METABOLIC PANEL
Anion gap: 15 (ref 5–15)
BUN: 26 mg/dL — ABNORMAL HIGH (ref 8–23)
CO2: 21 mmol/L — ABNORMAL LOW (ref 22–32)
Calcium: 9.7 mg/dL (ref 8.9–10.3)
Chloride: 101 mmol/L (ref 98–111)
Creatinine, Ser: 1.47 mg/dL — ABNORMAL HIGH (ref 0.61–1.24)
GFR calc Af Amer: 55 mL/min — ABNORMAL LOW (ref >60)
GFR calc non Af Amer: 47 mL/min — ABNORMAL LOW (ref >60)
Glucose, Bld: 128 mg/dL — ABNORMAL HIGH (ref 70–99)
Potassium: 4.3 mmol/L (ref 3.5–5.1)
Sodium: 137 mmol/L (ref 135–145)

## 2017-09-06 LAB — MAGNESIUM: Magnesium: 2 mg/dL (ref 1.7–2.4)

## 2017-09-06 NOTE — Progress Notes (Signed)
Primary Care Physician: Minna Merritts, MD Primary Cardiologist: Rockey Situ Primary Electrophysiologist: Valarie Merino is a 67 y.o. male with a history of persistent atrial fibrillation who presents for follow up in the [facility].  Since last being seen in clinic, the patient has been loaded on Tikosyn, and is in [place].  He feels improved.   Today, he  denies symptoms of palpitations, chest pain, shortness of breath, orthopnea, PND, lower extremity edema, dizziness, presyncope, syncope, snoring, daytime somnolence, bleeding, or neurologic sequela. The patient is tolerating medications without difficulties and is otherwise without complaint today.    Atrial Fibrillation Risk Factors:  he does not have symptoms or diagnosis of sleep apnea.  he does not have a history of rheumatic fever.  he does have a history of alcohol use. He has stopped drinking.   he has a BMI of Body mass index is 24.01 kg/m.Marland Kitchen Filed Weights   09/06/17 0935  Weight: 182 lb (82.6 kg)    LA size: 36   Atrial Fibrillation Management history:  Previous antiarrhythmic drugs: none  Previous cardioversions: none  Previous ablations: none  CHADS2VASC score: 2  Anticoagulation history: Xarelto   Past Medical History:  Diagnosis Date  . Alcohol abuse   . Chronic systolic (congestive) heart failure ([place])    a. TTE 8/17: EF 20-25%, mild conc LVH, mild MR, mild biatrial enlarge, mild-mod TR, PASP 45, aortic root 39 mm, small posterior pericardial effusion, Afib with RVR w/ HR 120-150 bpm; b. TTE 6/18: EF 60-65%, mod LVH, mild MR, mild biatrial enlarge, mildly dilated RV, midlly reduced RVSF, mild-mod TR  . Essential hypertension   . Persistent atrial fibrillation ([facility]CC)    a. on Xarelto; b. CHADS2VASc 3 (CHF, HTN, age x 1)  . Skin cancer of face    "under left eye"   Past Surgical History:  Procedure Laterality Date  . KNEE ARTHROSCOPY Bilateral X 3   "twice on the  left"  . MOHS SURGERY     "under left eye"  . TONSILLECTOMY      Current Outpatient Medications  Medication Sig Dispense Refill  . diltiazem (CARDIZEM CD) 180 MG 24 hr capsule Take 1 capsule (180 mg total) by mouth daily. 30 capsule 6  . dofetilide (TIKOSYN) 250 MCG capsule Take 1 capsule (250 mcg total) by mouth 2 (two) times daily. 60 capsule 6  . magnesium oxide (MAG-OX) 400 (241.3 Mg) MG tablet Take 1 tablet (400 mg total) by mouth 2 (two) times daily. 60 tablet 6  . metoprolol succinate (TOPROL-XL) 100 MG 24 hr tablet Take 1 tablet (100 mg total) by mouth daily. 30 tablet 6  . sacubitril-valsartan (ENTRESTO) 24-26 MG Take 1 tablet by mouth 2 (two) times daily. 180 tablet 3  . XARELTO 20 MG TABS tablet TAKE 1 TABLET BY MOUTH DAILY WITH SUPPER (Patient taking differently: TAKE 1 TABLET BY MOUTH DAILY in the morning) 30 tablet 3   No current facility-administered medications for this encounter.     No Known Allergies  Social History   Socioeconomic History  . Marital status: Married    Spouse name: Not on file  . Number of children: Not on file  . Years of education: Not on file  . Highest education level: Not on file  Occupational History  . Not on file  Social Needs  . Financial resource strain: Not on file  . Food insecurity:    Worry: Not on file  Inability: Not on file  . Transportation needs:    Medical: Not on file    Non-medical: Not on file  Tobacco Use  . Smoking status: Never Smoker  . Smokeless tobacco: Never Used  Substance and Sexual Activity  . Alcohol use: Yes    Alcohol/week: 1.2 oz    Types: 2 Glasses of wine per week  . Drug use: No  . Sexual activity: Not on file  Lifestyle  . Physical activity:    Days per week: Not on file    Minutes per session: Not on file  . Stress: Not on file  Relationships  . Social connections:    Talks on phone: Not on file    Gets together: Not on file    Attends religious service: Not on file    Active  member of club or organization: Not on file    Attends meetings of clubs or organizations: Not on file    Relationship status: Not on file  . Intimate partner violence:    Fear of current or ex partner: Not on file    Emotionally abused: Not on file    Physically abused: Not on file    Forced sexual activity: Not on file  Other Topics Concern  . Not on file  Social History Narrative  . Not on file    Family History  Problem Relation Age of Onset  . Dementia Mother   . Heart disease Father   . Heart attack Father     ROS- All systems are reviewed and negative except as per the HPI above.  Physical Exam: Vitals:   09/06/17 0935  BP: 124/80  Pulse: 76  Weight: 182 lb (82.6 kg)  Height: 6\' 1"  (1.854 m)    GEN- The patient is well appearing, alert and oriented x 3 today.   Head- normocephalic, atraumatic Eyes-  Sclera clear, conjunctiva pink Ears- hearing intact Oropharynx- clear Neck- supple  Lungs- Clear to ausculation bilaterally, normal work of breathing Heart-NSR, normal EKG, pr int 156 ms, qrs int 84 ms, qtc 474 ms GI- soft, NT, ND, + BS Extremities- no clubbing, cyanosis, or edema MS- no significant deformity or atrophy Skin- no rash or lesion Psych- euthymic mood, full affect Neuro- strength and sensation are intact  Wt Readings from Last 3 Encounters:  09/06/17 182 lb (82.6 kg)  08/30/17 175 lb 11.2 oz (79.7 kg)  08/27/17 185 lb 9.6 oz (84.2 kg)    EKG today demonstrates  SR at 76 bpm, pr int 156 ms, qrs int 84 ms, qtc 474 ms Echo 07/2017 demonstrated EF 55-60%, no RWMA, LA 36  Epic records are reviewed at length today  Assessment and Plan:  1. Persistent atrial fibrillation S/p  Tikosyn admission  In SR General Tikosyn precautions reviewed QTc 474 ms BMET, Mg drawn this morning Continue Xarelto for CHADS2VASC of 2 - he reports no missed doses in last 3 weeks   2. NICM - resolved Previously related to ETOH No longer drinks Continue current  meds EF normalized  3.  HTN Stable No change required today  F/u with Dr. Curt Bears  8/21   Geroge Baseman. Hedaya Latendresse, [facility] [address] [place], [place] [address] [phone]

## 2017-10-03 ENCOUNTER — Ambulatory Visit: Payer: Medicare Other | Admitting: Cardiology

## 2017-12-08 ENCOUNTER — Other Ambulatory Visit: Payer: Self-pay | Admitting: Cardiovascular Disease

## 2017-12-10 NOTE — Telephone Encounter (Signed)
Please review for refill.  

## 2018-02-08 DIAGNOSIS — L814 Other melanin hyperpigmentation: Secondary | ICD-10-CM | POA: Diagnosis not present

## 2018-02-08 DIAGNOSIS — L821 Other seborrheic keratosis: Secondary | ICD-10-CM | POA: Diagnosis not present

## 2018-02-08 DIAGNOSIS — Z85828 Personal history of other malignant neoplasm of skin: Secondary | ICD-10-CM | POA: Diagnosis not present

## 2018-02-08 DIAGNOSIS — D1801 Hemangioma of skin and subcutaneous tissue: Secondary | ICD-10-CM | POA: Diagnosis not present

## 2018-03-01 ENCOUNTER — Other Ambulatory Visit (HOSPITAL_COMMUNITY): Payer: Self-pay | Admitting: Physician Assistant

## 2018-04-05 ENCOUNTER — Other Ambulatory Visit (HOSPITAL_COMMUNITY): Payer: Self-pay | Admitting: Nurse Practitioner

## 2018-04-11 ENCOUNTER — Other Ambulatory Visit: Payer: Self-pay | Admitting: Cardiovascular Disease

## 2018-04-11 NOTE — Telephone Encounter (Signed)
Please review for refill.  

## 2018-04-12 ENCOUNTER — Emergency Department: Payer: Medicare Other

## 2018-04-12 ENCOUNTER — Other Ambulatory Visit: Payer: Self-pay

## 2018-04-12 ENCOUNTER — Inpatient Hospital Stay
Admission: EM | Admit: 2018-04-12 | Discharge: 2018-04-23 | DRG: 896 | Disposition: A | Discharge status: Skilled Nursing Facility | Payer: Medicare Other | Attending: Internal Medicine | Admitting: Internal Medicine

## 2018-04-12 DIAGNOSIS — R609 Edema, unspecified: Secondary | ICD-10-CM

## 2018-04-12 DIAGNOSIS — I4819 Other persistent atrial fibrillation: Secondary | ICD-10-CM | POA: Diagnosis present

## 2018-04-12 DIAGNOSIS — Z452 Encounter for adjustment and management of vascular access device: Secondary | ICD-10-CM | POA: Diagnosis not present

## 2018-04-12 DIAGNOSIS — Z85828 Personal history of other malignant neoplasm of skin: Secondary | ICD-10-CM

## 2018-04-12 DIAGNOSIS — W010XXA Fall on same level from slipping, tripping and stumbling without subsequent striking against object, initial encounter: Secondary | ICD-10-CM | POA: Diagnosis present

## 2018-04-12 DIAGNOSIS — Z79899 Other long term (current) drug therapy: Secondary | ICD-10-CM | POA: Diagnosis not present

## 2018-04-12 DIAGNOSIS — R509 Fever, unspecified: Secondary | ICD-10-CM | POA: Diagnosis not present

## 2018-04-12 DIAGNOSIS — W19XXXA Unspecified fall, initial encounter: Secondary | ICD-10-CM | POA: Diagnosis not present

## 2018-04-12 DIAGNOSIS — E7800 Pure hypercholesterolemia, unspecified: Secondary | ICD-10-CM | POA: Diagnosis present

## 2018-04-12 DIAGNOSIS — S199XXA Unspecified injury of neck, initial encounter: Secondary | ICD-10-CM | POA: Diagnosis not present

## 2018-04-12 DIAGNOSIS — J9601 Acute respiratory failure with hypoxia: Secondary | ICD-10-CM | POA: Diagnosis not present

## 2018-04-12 DIAGNOSIS — I5022 Chronic systolic (congestive) heart failure: Secondary | ICD-10-CM | POA: Diagnosis present

## 2018-04-12 DIAGNOSIS — S0990XA Unspecified injury of head, initial encounter: Secondary | ICD-10-CM | POA: Diagnosis not present

## 2018-04-12 DIAGNOSIS — F10229 Alcohol dependence with intoxication, unspecified: Secondary | ICD-10-CM | POA: Diagnosis present

## 2018-04-12 DIAGNOSIS — E875 Hyperkalemia: Secondary | ICD-10-CM

## 2018-04-12 DIAGNOSIS — J90 Pleural effusion, not elsewhere classified: Secondary | ICD-10-CM | POA: Diagnosis not present

## 2018-04-12 DIAGNOSIS — T68XXXA Hypothermia, initial encounter: Secondary | ICD-10-CM | POA: Diagnosis not present

## 2018-04-12 DIAGNOSIS — N170 Acute kidney failure with tubular necrosis: Secondary | ICD-10-CM | POA: Diagnosis present

## 2018-04-12 DIAGNOSIS — I959 Hypotension, unspecified: Secondary | ICD-10-CM | POA: Diagnosis present

## 2018-04-12 DIAGNOSIS — R001 Bradycardia, unspecified: Secondary | ICD-10-CM | POA: Diagnosis not present

## 2018-04-12 DIAGNOSIS — R410 Disorientation, unspecified: Secondary | ICD-10-CM | POA: Diagnosis not present

## 2018-04-12 DIAGNOSIS — Z8249 Family history of ischemic heart disease and other diseases of the circulatory system: Secondary | ICD-10-CM

## 2018-04-12 DIAGNOSIS — M25461 Effusion, right knee: Secondary | ICD-10-CM | POA: Diagnosis not present

## 2018-04-12 DIAGNOSIS — F10239 Alcohol dependence with withdrawal, unspecified: Secondary | ICD-10-CM | POA: Diagnosis not present

## 2018-04-12 DIAGNOSIS — N183 Chronic kidney disease, stage 3 (moderate): Secondary | ICD-10-CM | POA: Diagnosis present

## 2018-04-12 DIAGNOSIS — I130 Hypertensive heart and chronic kidney disease with heart failure and stage 1 through stage 4 chronic kidney disease, or unspecified chronic kidney disease: Secondary | ICD-10-CM | POA: Diagnosis present

## 2018-04-12 DIAGNOSIS — J189 Pneumonia, unspecified organism: Secondary | ICD-10-CM | POA: Diagnosis present

## 2018-04-12 DIAGNOSIS — E872 Acidosis: Secondary | ICD-10-CM | POA: Diagnosis not present

## 2018-04-12 DIAGNOSIS — D539 Nutritional anemia, unspecified: Secondary | ICD-10-CM | POA: Diagnosis present

## 2018-04-12 DIAGNOSIS — D696 Thrombocytopenia, unspecified: Secondary | ICD-10-CM | POA: Diagnosis present

## 2018-04-12 DIAGNOSIS — R571 Hypovolemic shock: Secondary | ICD-10-CM | POA: Diagnosis present

## 2018-04-12 DIAGNOSIS — R0600 Dyspnea, unspecified: Secondary | ICD-10-CM

## 2018-04-12 DIAGNOSIS — E876 Hypokalemia: Secondary | ICD-10-CM | POA: Diagnosis not present

## 2018-04-12 DIAGNOSIS — S82002A Unspecified fracture of left patella, initial encounter for closed fracture: Secondary | ICD-10-CM | POA: Diagnosis present

## 2018-04-12 DIAGNOSIS — S0101XA Laceration without foreign body of scalp, initial encounter: Secondary | ICD-10-CM | POA: Diagnosis present

## 2018-04-12 DIAGNOSIS — I361 Nonrheumatic tricuspid (valve) insufficiency: Secondary | ICD-10-CM | POA: Diagnosis not present

## 2018-04-12 DIAGNOSIS — F10929 Alcohol use, unspecified with intoxication, unspecified: Secondary | ICD-10-CM | POA: Diagnosis not present

## 2018-04-12 DIAGNOSIS — Z7901 Long term (current) use of anticoagulants: Secondary | ICD-10-CM | POA: Diagnosis not present

## 2018-04-12 DIAGNOSIS — I420 Dilated cardiomyopathy: Secondary | ICD-10-CM | POA: Diagnosis present

## 2018-04-12 DIAGNOSIS — G9341 Metabolic encephalopathy: Secondary | ICD-10-CM | POA: Diagnosis not present

## 2018-04-12 DIAGNOSIS — X58XXXA Exposure to other specified factors, initial encounter: Secondary | ICD-10-CM | POA: Diagnosis not present

## 2018-04-12 DIAGNOSIS — I340 Nonrheumatic mitral (valve) insufficiency: Secondary | ICD-10-CM | POA: Diagnosis not present

## 2018-04-12 DIAGNOSIS — N179 Acute kidney failure, unspecified: Secondary | ICD-10-CM | POA: Diagnosis not present

## 2018-04-12 DIAGNOSIS — F1010 Alcohol abuse, uncomplicated: Secondary | ICD-10-CM | POA: Diagnosis not present

## 2018-04-12 DIAGNOSIS — G92 Toxic encephalopathy: Secondary | ICD-10-CM | POA: Diagnosis present

## 2018-04-12 DIAGNOSIS — R680 Hypothermia, not associated with low environmental temperature: Secondary | ICD-10-CM | POA: Diagnosis present

## 2018-04-12 DIAGNOSIS — I10 Essential (primary) hypertension: Secondary | ICD-10-CM | POA: Diagnosis not present

## 2018-04-12 DIAGNOSIS — R0602 Shortness of breath: Secondary | ICD-10-CM | POA: Diagnosis not present

## 2018-04-12 DIAGNOSIS — G9340 Encephalopathy, unspecified: Secondary | ICD-10-CM | POA: Diagnosis not present

## 2018-04-12 DIAGNOSIS — R42 Dizziness and giddiness: Secondary | ICD-10-CM | POA: Diagnosis not present

## 2018-04-12 DIAGNOSIS — D638 Anemia in other chronic diseases classified elsewhere: Secondary | ICD-10-CM | POA: Diagnosis present

## 2018-04-12 DIAGNOSIS — M25061 Hemarthrosis, right knee: Secondary | ICD-10-CM | POA: Diagnosis not present

## 2018-04-12 DIAGNOSIS — I480 Paroxysmal atrial fibrillation: Secondary | ICD-10-CM | POA: Diagnosis not present

## 2018-04-12 DIAGNOSIS — S82092A Other fracture of left patella, initial encounter for closed fracture: Secondary | ICD-10-CM | POA: Diagnosis not present

## 2018-04-12 LAB — COMPREHENSIVE METABOLIC PANEL
ALT: 37 U/L (ref 0–44)
AST: 64 U/L — AB (ref 15–41)
Albumin: 3.2 g/dL — ABNORMAL LOW (ref 3.5–5.0)
Alkaline Phosphatase: 53 U/L (ref 38–126)
Anion gap: 18 — ABNORMAL HIGH (ref 5–15)
BUN: 40 mg/dL — ABNORMAL HIGH (ref 8–23)
CO2: 11 mmol/L — ABNORMAL LOW (ref 22–32)
Calcium: 7 mg/dL — ABNORMAL LOW (ref 8.9–10.3)
Chloride: 104 mmol/L (ref 98–111)
Creatinine, Ser: 3.07 mg/dL — ABNORMAL HIGH (ref 0.61–1.24)
GFR calc Af Amer: 23 mL/min — ABNORMAL LOW (ref >60)
GFR, EST NON AFRICAN AMERICAN: 20 mL/min — AB (ref >60)
Glucose, Bld: 59 mg/dL — ABNORMAL LOW (ref 70–99)
Potassium: 6 mmol/L — ABNORMAL HIGH (ref 3.5–5.1)
Sodium: 133 mmol/L — ABNORMAL LOW (ref 135–145)
TOTAL PROTEIN: 5.5 g/dL — AB (ref 6.5–8.1)
Total Bilirubin: 1.2 mg/dL (ref 0.3–1.2)

## 2018-04-12 LAB — CK: Total CK: 129 U/L (ref 49–397)

## 2018-04-12 LAB — TROPONIN I

## 2018-04-12 LAB — CBC
HCT: 38 % — ABNORMAL LOW (ref 39.0–52.0)
Hemoglobin: 12.4 g/dL — ABNORMAL LOW (ref 13.0–17.0)
MCH: 34.4 pg — ABNORMAL HIGH (ref 26.0–34.0)
MCHC: 32.6 g/dL (ref 30.0–36.0)
MCV: 105.6 fL — ABNORMAL HIGH (ref 80.0–100.0)
NRBC: 0 % (ref 0.0–0.2)
PLATELETS: 102 10*3/uL — AB (ref 150–400)
RBC: 3.6 MIL/uL — AB (ref 4.22–5.81)
RDW: 13.3 % (ref 11.5–15.5)
WBC: 6.3 10*3/uL (ref 4.0–10.5)

## 2018-04-12 LAB — BLOOD GAS, VENOUS
Acid-base deficit: 15.7 mmol/L — ABNORMAL HIGH (ref 0.0–2.0)
Bicarbonate: 11.1 mmol/L — ABNORMAL LOW (ref 20.0–28.0)
O2 Saturation: 50.9 %
PATIENT TEMPERATURE: 37
pCO2, Ven: 29 mmHg — ABNORMAL LOW (ref 44.0–60.0)
pH, Ven: 7.19 — CL (ref 7.250–7.430)
pO2, Ven: 35 mmHg (ref 32.0–45.0)

## 2018-04-12 LAB — LACTIC ACID, PLASMA
Lactic Acid, Venous: 3.6 mmol/L (ref 0.5–1.9)
Lactic Acid, Venous: 3 mmol/L (ref 0.5–1.9)

## 2018-04-12 LAB — GLUCOSE, CAPILLARY
Glucose-Capillary: 37 mg/dL — CL (ref 70–99)
Glucose-Capillary: 39 mg/dL — CL (ref 70–99)

## 2018-04-12 LAB — ETHANOL: Alcohol, Ethyl (B): 272 mg/dL — ABNORMAL HIGH (ref <10)

## 2018-04-12 LAB — TSH: TSH: 1.493 u[IU]/mL (ref 0.350–4.500)

## 2018-04-12 LAB — HEMOGLOBIN: HEMOGLOBIN: 11.9 g/dL — AB (ref 13.0–17.0)

## 2018-04-12 MED ORDER — DEXTROSE 50 % IV SOLN
INTRAVENOUS | Status: AC
  Filled 2018-04-12: qty 50

## 2018-04-12 MED ORDER — VITAMIN B-1 100 MG PO TABS
100.0000 mg | ORAL_TABLET | Freq: Every day | ORAL | Status: DC
  Administered 2018-04-13 – 2018-04-15 (×3): 100 mg via ORAL
  Filled 2018-04-12 (×4): qty 1

## 2018-04-12 MED ORDER — SODIUM CHLORIDE 0.9 % IV SOLN
2.0000 g | Freq: Once | INTRAVENOUS | Status: AC
  Administered 2018-04-13: 2 g via INTRAVENOUS
  Filled 2018-04-12: qty 2

## 2018-04-12 MED ORDER — SODIUM CHLORIDE 0.9 % IV SOLN
1.0000 g | Freq: Two times a day (BID) | INTRAVENOUS | Status: DC
  Filled 2018-04-12: qty 1

## 2018-04-12 MED ORDER — DEXTROSE 50 % IV SOLN
12.5000 g | Freq: Once | INTRAVENOUS | Status: AC
  Administered 2018-04-13: 12.5 g via INTRAVENOUS

## 2018-04-12 MED ORDER — SODIUM CHLORIDE 0.9 % IV BOLUS
1000.0000 mL | Freq: Once | INTRAVENOUS | Status: AC
  Administered 2018-04-12: 1000 mL via INTRAVENOUS

## 2018-04-12 MED ORDER — DOFETILIDE 250 MCG PO CAPS: 250.0000 ug | ORAL_CAPSULE | ORAL | Status: DC

## 2018-04-12 MED ORDER — SODIUM ZIRCONIUM CYCLOSILICATE 10 G PO PACK
10.0000 g | PACK | Freq: Once | ORAL | Status: AC
  Administered 2018-04-12: 10 g via ORAL
  Filled 2018-04-12: qty 1

## 2018-04-12 MED ORDER — THIAMINE HCL 100 MG/ML IJ SOLN
100.0000 mg | Freq: Every day | INTRAMUSCULAR | Status: DC
  Administered 2018-04-16: 100 mg via INTRAVENOUS
  Filled 2018-04-12 (×3): qty 2

## 2018-04-12 MED ORDER — SODIUM CHLORIDE 0.9 % IV BOLUS: 1000.0000 mL | Freq: Once | INTRAVENOUS | Status: DC

## 2018-04-12 MED ORDER — LORAZEPAM 2 MG PO TABS: 0.0000 mg | ORAL_TABLET | Freq: Two times a day (BID) | ORAL | Status: DC

## 2018-04-12 MED ORDER — ADULT MULTIVITAMIN W/MINERALS CH
1.0000 | ORAL_TABLET | Freq: Every day | ORAL | Status: DC
  Administered 2018-04-13 – 2018-04-23 (×11): 1 via ORAL
  Filled 2018-04-12 (×11): qty 1

## 2018-04-12 MED ORDER — VITAMIN B-1 100 MG PO TABS: 100.0000 mg | ORAL_TABLET | Freq: Every day | ORAL | Status: DC

## 2018-04-12 MED ORDER — STERILE WATER FOR INJECTION IV SOLN
INTRAVENOUS | Status: DC
  Administered 2018-04-12 – 2018-04-13 (×3): via INTRAVENOUS
  Filled 2018-04-12 (×4): qty 850

## 2018-04-12 MED ORDER — PIPERACILLIN-TAZOBACTAM 3.375 G IVPB: 3.3750 g | Freq: Three times a day (TID) | INTRAVENOUS | Status: DC

## 2018-04-12 MED ORDER — ACETAMINOPHEN 325 MG PO TABS
650.0000 mg | ORAL_TABLET | Freq: Four times a day (QID) | ORAL | Status: DC | PRN
  Administered 2018-04-14 – 2018-04-15 (×2): 650 mg via ORAL
  Filled 2018-04-12 (×2): qty 2

## 2018-04-12 MED ORDER — ATROPINE SULFATE 1 MG/10ML IJ SOSY
0.5000 mg | PREFILLED_SYRINGE | Freq: Once | INTRAMUSCULAR | Status: AC
  Administered 2018-04-12: 0.5 mg via INTRAVENOUS
  Filled 2018-04-12: qty 10

## 2018-04-12 MED ORDER — FOLIC ACID 1 MG PO TABS
1.0000 mg | ORAL_TABLET | Freq: Every day | ORAL | Status: DC
  Administered 2018-04-13 – 2018-04-16 (×4): 1 mg via ORAL
  Filled 2018-04-12 (×4): qty 1

## 2018-04-12 MED ORDER — LORAZEPAM 1 MG PO TABS: 1.0000 mg | ORAL_TABLET | Freq: Four times a day (QID) | ORAL | Status: DC | PRN

## 2018-04-12 MED ORDER — DEXTROSE 50 % IV SOLN
25.0000 g | Freq: Once | INTRAVENOUS | Status: AC
  Administered 2018-04-12: 25 g via INTRAVENOUS
  Filled 2018-04-12: qty 50

## 2018-04-12 MED ORDER — ACETAMINOPHEN 650 MG RE SUPP: 650.0000 mg | Freq: Four times a day (QID) | RECTAL | Status: DC | PRN

## 2018-04-12 MED ORDER — SODIUM CHLORIDE 0.9 % IV SOLN: 1.0000 g | Freq: Once | INTRAVENOUS | Status: DC

## 2018-04-12 MED ORDER — ONDANSETRON HCL 4 MG PO TABS: 4.0000 mg | ORAL_TABLET | Freq: Four times a day (QID) | ORAL | Status: DC | PRN

## 2018-04-12 MED ORDER — LORAZEPAM 2 MG PO TABS: 0.0000 mg | ORAL_TABLET | Freq: Four times a day (QID) | ORAL | Status: AC

## 2018-04-12 MED ORDER — VANCOMYCIN HCL 10 G IV SOLR
2000.0000 mg | Freq: Once | INTRAVENOUS | Status: AC
  Administered 2018-04-13: 2000 mg via INTRAVENOUS
  Filled 2018-04-12: qty 2000

## 2018-04-12 MED ORDER — DEXTROSE 50 % IV SOLN
INTRAVENOUS | Status: AC
  Administered 2018-04-13: 12.5 g via INTRAVENOUS
  Filled 2018-04-12: qty 50

## 2018-04-12 MED ORDER — ONDANSETRON HCL 4 MG/2ML IJ SOLN: 4.0000 mg | Freq: Four times a day (QID) | INTRAMUSCULAR | Status: DC | PRN

## 2018-04-12 MED ORDER — SODIUM CHLORIDE 0.9 % IV SOLN
INTRAVENOUS | Status: DC
  Administered 2018-04-13: via INTRAVENOUS

## 2018-04-12 MED ORDER — INSULIN ASPART 100 UNIT/ML ~~LOC~~ SOLN
10.0000 [IU] | Freq: Once | SUBCUTANEOUS | Status: AC
  Administered 2018-04-12: 10 [IU] via INTRAVENOUS
  Filled 2018-04-12: qty 1

## 2018-04-12 MED ORDER — CALCIUM GLUCONATE-NACL 1-0.675 GM/50ML-% IV SOLN
1.0000 g | Freq: Once | INTRAVENOUS | Status: AC
  Administered 2018-04-12: 1000 mg via INTRAVENOUS
  Filled 2018-04-12: qty 50

## 2018-04-12 MED ORDER — NOREPINEPHRINE 4 MG/250ML-% IV SOLN
0.0000 ug/min | INTRAVENOUS | Status: DC
  Administered 2018-04-12: 2 ug/min via INTRAVENOUS
  Filled 2018-04-12 (×2): qty 250

## 2018-04-12 MED ORDER — LORAZEPAM 2 MG/ML IJ SOLN
0.0000 mg | Freq: Four times a day (QID) | INTRAMUSCULAR | Status: AC
  Administered 2018-04-13: 4 mg via INTRAVENOUS
  Filled 2018-04-12: qty 2

## 2018-04-12 MED ORDER — THIAMINE HCL 100 MG/ML IJ SOLN: 100.0000 mg | Freq: Every day | INTRAMUSCULAR | Status: DC

## 2018-04-12 MED ORDER — LORAZEPAM 2 MG/ML IJ SOLN: 0.0000 mg | Freq: Two times a day (BID) | INTRAMUSCULAR | Status: DC

## 2018-04-12 MED ORDER — SODIUM BICARBONATE 8.4 % IV SOLN
100.0000 meq | Freq: Once | INTRAVENOUS | Status: AC
  Administered 2018-04-13: 100 meq via INTRAVENOUS
  Filled 2018-04-12: qty 50

## 2018-04-12 MED ORDER — LORAZEPAM 2 MG/ML IJ SOLN
1.0000 mg | Freq: Four times a day (QID) | INTRAMUSCULAR | Status: DC | PRN
  Administered 2018-04-13 – 2018-04-15 (×3): 1 mg via INTRAVENOUS
  Filled 2018-04-12 (×3): qty 1

## 2018-04-12 NOTE — ED Triage Notes (Signed)
Patient was found on floor by spouse. Patient has LAC to left side of back of head and mouth bleeding. Patient has been drinking alcohol during the day while wife was not home.  EMS states HR in 54'M with systolic pressure 60. CBG 68 per patient has not ate since yesterday.  1mg  atropine given in route with no change. Total 800 ml normal saline given prior to arrival to ED. Patient has history of Afib and CHF.

## 2018-04-12 NOTE — ED Notes (Signed)
Patient returned from CT

## 2018-04-12 NOTE — H&P (Signed)
[name]: [name]    MR#:  [id number]  DATE OF BIRTH:  [birth year]  DATE OF ADMISSION:  04/12/2018  PRIMARY CARE PHYSICIAN: Minna Merritts, MD   REQUESTING/REFERRING PHYSICIAN: Harvest Dark, MD  CHIEF COMPLAINT:   Chief Complaint  Patient presents with  . Fall  . Laceration    HISTORY OF PRESENT ILLNESS: Patrick Roberts  is a 68 y.o. male with a known history of alcohol abuse, essential hypertension, atrial fibrillation who is presenting to the emergency room after a fall and laceration to his head.  Patient states that he was drinking earlier and lost balance and fell.  In the emergency room he was noted to be hypothermic, and hypotensive.  He received 4 L of fluid bolus blood pressure still very low.  ED physician has put a central line.  Patient otherwise is awake and alert denies any chest pain or shortness of breath.    PAST MEDICAL HISTORY:   Past Medical History:  Diagnosis Date  . Alcohol abuse   . Chronic systolic (congestive) heart failure ([place])    a. TTE 8/17: EF 20-25%, mild conc LVH, mild MR, mild biatrial enlarge, mild-mod TR, PASP 45, aortic root 39 mm, small posterior pericardial effusion, Afib with RVR w/ HR 120-150 bpm; b. TTE 6/18: EF 60-65%, mod LVH, mild MR, mild biatrial enlarge, mildly dilated RV, midlly reduced RVSF, mild-mod TR  . Essential hypertension   . Persistent atrial fibrillation    a. on Xarelto; b. CHADS2VASc 3 (CHF, HTN, age x 1)  . Skin cancer of face    "under left eye"    PAST SURGICAL HISTORY:  Past Surgical History:  Procedure Laterality Date  . KNEE ARTHROSCOPY Bilateral X 3   "twice on the left"  . MOHS SURGERY     "under left eye"  . TONSILLECTOMY      SOCIAL HISTORY:  Social History   Tobacco Use  . Smoking status: Never Smoker  . Smokeless tobacco: Never Used  Substance Use Topics  . Alcohol use: Yes    Alcohol/week: 2.0 standard drinks    Types: 2  Glasses of wine per week    FAMILY HISTORY:  Family History  Problem Relation Age of Onset  . Dementia Mother   . Heart disease Father   . Heart attack Father     DRUG ALLERGIES: No Known Allergies  REVIEW OF SYSTEMS:   CONSTITUTIONAL: No fever, fatigue or weakness.  EYES: No blurred or double vision.  EARS, NOSE, AND THROAT: No tinnitus or ear pain.  RESPIRATORY: No cough, shortness of breath, wheezing or hemoptysis.  CARDIOVASCULAR: No chest pain, orthopnea, edema.  GASTROINTESTINAL: No nausea, vomiting, diarrhea or abdominal pain.  GENITOURINARY: No dysuria, hematuria.  ENDOCRINE: No polyuria, nocturia,  HEMATOLOGY: No anemia, easy bruising or bleeding SKIN: No rash or lesion.  Bleeding from back of his head MUSCULOSKELETAL: No joint pain or arthritis.   NEUROLOGIC: No tingling, numbness, weakness.  PSYCHIATRY: No anxiety or depression.   MEDICATIONS AT HOME:  Prior to Admission medications   Medication Sig Start Date End Date Taking? Authorizing Provider  diltiazem (CARDIZEM CD) 180 MG 24 hr capsule Take 1 capsule (180 mg total) by mouth daily. Patient taking differently: Take 180 mg by mouth 2 (two) times daily.  08/30/17  Yes Baldwin Jamaica, PA-C  dofetilide (TIKOSYN) 250 MCG capsule TAKE 1 CAPSULE (250 MCG) BY MOUTH TWICE DAILY - APPT. REQUIRED FOR  REFILLS 04/08/18  Yes Sherran Needs, NP  magnesium oxide (MAG-OX) 400 (241.3 Mg) MG tablet Take 1 tablet (400 mg total) by mouth 2 (two) times daily. 08/30/17  Yes Baldwin Jamaica, PA-C  metoprolol succinate (TOPROL-XL) 100 MG 24 hr tablet Take 1 tablet (100 mg total) by mouth daily. 08/30/17  Yes Baldwin Jamaica, PA-C  sacubitril-valsartan (ENTRESTO) 24-26 MG Take 1 tablet by mouth 2 (two) times daily. 07/10/17  Yes Dunn, Ryan M, PA-C  XARELTO 20 MG TABS tablet TAKE ONE TABLET EVERY DAY WITH SUPPER 04/12/18  Yes Gollan, Kathlene November, MD      PHYSICAL EXAMINATION:   VITAL SIGNS: Blood pressure (!) 57/40, pulse (!) 43,  temperature (!) 94.8 F (34.9 C), temperature source Rectal, resp. rate 15, height 6\' 1"  (1.854 m), weight 83.9 kg, SpO2 94 %.  GENERAL:  68 y.o.-year-old patient lying in the bed with no acute distress.  EYES: Pupils equal, round, reactive to light and accommodation. No scleral icterus. Extraocular muscles intact.  HEENT: Head atraumatic, normocephalic. Oropharynx and nasopharynx clear.  NECK:  Supple, no jugular venous distention. No thyroid enlargement, no tenderness.  LUNGS: Normal breath sounds bilaterally, no wheezing, rales,rhonchi or crepitation. No use of accessory muscles of respiration.  CARDIOVASCULAR: S1, S2 normal. No murmurs, rubs, or gallops.  ABDOMEN: Soft, nontender, nondistended. Bowel sounds present. No organomegaly or mass.  EXTREMITIES: No pedal edema, cyanosis, or clubbing.  NEUROLOGIC: Cranial nerves II through XII are intact. Muscle strength 5/5 in all extremities. Sensation intact. Gait not checked.  PSYCHIATRIC: The patient is alert and oriented x 3.  SKIN: Active bleeding from back of the head LABORATORY PANEL:   CBC Recent Labs  Lab 04/12/18 1848  WBC 6.3  HGB 12.4*  HCT 38.0*  PLT 102*  MCV 105.6*  MCH 34.4*  MCHC 32.6  RDW 13.3   ------------------------------------------------------------------------------------------------------------------  Chemistries  Recent Labs  Lab 04/12/18 2000  NA 133*  K 6.0*  CL 104  CO2 11*  GLUCOSE 59*  BUN 40*  CREATININE 3.07*  CALCIUM 7.0*  AST 64*  ALT 37  ALKPHOS 53  BILITOT 1.2   ------------------------------------------------------------------------------------------------------------------ estimated creatinine clearance is 26 mL/min (A) (by C-G formula based on SCr of 3.07 mg/dL (H)). ------------------------------------------------------------------------------------------------------------------ Recent Labs    04/12/18 2000  TSH 1.493     Coagulation profile No results for input(s):  INR, PROTIME in the last 168 hours. ------------------------------------------------------------------------------------------------------------------- No results for input(s): DDIMER in the last 72 hours. -------------------------------------------------------------------------------------------------------------------  Cardiac Enzymes Recent Labs  Lab 04/12/18 2000  TROPONINI <0.03   ------------------------------------------------------------------------------------------------------------------ Invalid input(s): POCBNP  ---------------------------------------------------------------------------------------------------------------  Urinalysis    Component Value Date/Time   BILIRUBINUR negative 01/17/2016 1427   PROTEINUR trace 01/17/2016 1427   UROBILINOGEN 0.2 01/17/2016 1427   NITRITE negative 01/17/2016 1427   LEUKOCYTESUR Negative 01/17/2016 1427     RADIOLOGY: Ct Head Wo Contrast  Result Date: 04/12/2018 CLINICAL DATA:  Fall. EXAM: CT HEAD WITHOUT CONTRAST CT CERVICAL SPINE WITHOUT CONTRAST TECHNIQUE: Multidetector CT imaging of the head and cervical spine was performed following the standard protocol without intravenous contrast. Multiplanar CT image reconstructions of the cervical spine were also generated. COMPARISON:  None. FINDINGS: CT HEAD FINDINGS Brain: There is no evidence of acute infarct, intracranial hemorrhage, mass, midline shift, or extra-axial fluid collection. There is mild cerebral atrophy. Cerebral white matter hypodensities are nonspecific but compatible with mild chronic small vessel ischemic disease. Vascular: Calcified atherosclerosis at the skull base. No hyperdense vessel. Skull: No fracture or  focal osseous lesion. Sinuses/Orbits: Visualized paranasal sinuses are clear. Trace right mastoid effusion. Unremarkable orbits. Other: Small left lateral parietal scalp hematoma with foci of gas consistent with laceration. CT CERVICAL SPINE FINDINGS Alignment:  Reversal of the cervical lordosis. Grade 1 anterolisthesis of C3 on C4, C4 on C5, and C7 on T1. Grade 1 retrolisthesis of C5 on C6. Skull base and vertebrae: No acute fracture. Moderate C1-2 arthropathy with cystic changes in the base of the dens. Soft tissues and spinal canal: No prevertebral fluid or swelling. No visible canal hematoma. Disc levels: Advanced disc degeneration from C3-4-C6-7 with disc space narrowing most severe at C5-6 and C6-7. Severe facet arthrosis bilaterally at C3-4 and C7-T1 and on the right at C4-5. Erosive facet changes at C3-4. Moderate multilevel neural foraminal stenosis. No osseous spinal canal stenosis. Upper chest: Clear lung apices. Other: None. IMPRESSION: 1. No evidence of acute intracranial abnormality. 2. Mild chronic small vessel ischemic disease and cerebral atrophy. 3. Left parietal scalp hematoma. 4. No acute cervical spine fracture. Advanced disc and facet degeneration. Electronically Signed   By: Logan Bores M.D.   On: 04/12/2018 19:17   Ct Cervical Spine Wo Contrast  Result Date: 04/12/2018 CLINICAL DATA:  Fall. EXAM: CT HEAD WITHOUT CONTRAST CT CERVICAL SPINE WITHOUT CONTRAST TECHNIQUE: Multidetector CT imaging of the head and cervical spine was performed following the standard protocol without intravenous contrast. Multiplanar CT image reconstructions of the cervical spine were also generated. COMPARISON:  None. FINDINGS: CT HEAD FINDINGS Brain: There is no evidence of acute infarct, intracranial hemorrhage, mass, midline shift, or extra-axial fluid collection. There is mild cerebral atrophy. Cerebral white matter hypodensities are nonspecific but compatible with mild chronic small vessel ischemic disease. Vascular: Calcified atherosclerosis at the skull base. No hyperdense vessel. Skull: No fracture or focal osseous lesion. Sinuses/Orbits: Visualized paranasal sinuses are clear. Trace right mastoid effusion. Unremarkable orbits. Other: Small left lateral parietal  scalp hematoma with foci of gas consistent with laceration. CT CERVICAL SPINE FINDINGS Alignment: Reversal of the cervical lordosis. Grade 1 anterolisthesis of C3 on C4, C4 on C5, and C7 on T1. Grade 1 retrolisthesis of C5 on C6. Skull base and vertebrae: No acute fracture. Moderate C1-2 arthropathy with cystic changes in the base of the dens. Soft tissues and spinal canal: No prevertebral fluid or swelling. No visible canal hematoma. Disc levels: Advanced disc degeneration from C3-4-C6-7 with disc space narrowing most severe at C5-6 and C6-7. Severe facet arthrosis bilaterally at C3-4 and C7-T1 and on the right at C4-5. Erosive facet changes at C3-4. Moderate multilevel neural foraminal stenosis. No osseous spinal canal stenosis. Upper chest: Clear lung apices. Other: None. IMPRESSION: 1. No evidence of acute intracranial abnormality. 2. Mild chronic small vessel ischemic disease and cerebral atrophy. 3. Left parietal scalp hematoma. 4. No acute cervical spine fracture. Advanced disc and facet degeneration. Electronically Signed   By: Logan Bores M.D.   On: 04/12/2018 19:17    EKG: Orders placed or performed during the hospital encounter of 04/12/18  . ED EKG  . ED EKG  . EKG 12-Lead  . EKG 12-Lead  . EKG 12-Lead  . EKG 12-Lead    IMPRESSION AND PLAN: Patient 68 year old with alcohol abuse presenting with hypotension  1.  Severe hypotension etiology unclear patient does have hypothermia possible sepsis I will place on empiric antibiotics Obtain blood culture Patient will start on Levophed Continue IV fluids Check a urinalysis Check the flu test E link has been notified of admission  2.  Alcohol abuse CIWA protocol  3.  Atrial fibrillation we will hold his Xarelto, with his alcohol abuse patient not a good candidate for anticoagulation  4.  Acute renal failure with hyperkalemia suspect this is due to ATN patient's received treatment for hyperkalemia I will give him a dose of LOKELMA,  nephrology will be consulted  5.  Miscellaneous SCDs for DVT prophylaxis in light of bleeding from his scalp   All the records are reviewed and case discussed with ED provider. Management plans discussed with the patient, family and they are in agreement.  CODE STATUS: Code Status History    Date Active Date Inactive Code Status Order ID Comments User Context   08/27/2017 1404 08/30/2017 1727 Full Code [id number]  Charlynn Grimes Inpatient   07/26/2017 1528 07/27/2017 1943 Full Code [id number]  Baldwin Jamaica, PA-C ED       TOTAL TIME TAKING CARE OF THIS PATIENT: 55 minutes critical care time spent   Dustin Flock M.D on 04/12/2018 at 9:29 PM  Between 7am to 6pm - Pager - [phone]  After 6pm go to [email or web address] - password [email or web address]  [facility]  [phone]  CC: Primary care physician; Minna Merritts, MD

## 2018-04-12 NOTE — ED Notes (Signed)
EDP made aware of Lactic 3.6. No new orders at this time.

## 2018-04-12 NOTE — ED Provider Notes (Addendum)
[facility] Emergency Department Provider Note  Time seen: 8:36 PM  I have reviewed the triage vital signs and the nursing notes.   HISTORY  Chief Complaint Fall and Laceration    HPI Patrick Roberts is a 68 y.o. male past medical history of alcohol abuse, CHF, hypertension, A. fib on Xarelto, presents to the emergency department after a fall.  According to EMS report patient had a fall at home around 3:30 PM, wife found the patient on the floor, patient cannot stand due to diffuse weakness.  Eventually they called EMS who brought the patient to the emergency department via emergency traffic.  Per EMS the patient's heart rate was around 40 bpm with a blood pressure in the 50s and 60s.  Patient given a liter of IV fluids in route to the hospital and was paced with external pacer pads at a rate of 70 bpm.  Upon arrival patient is paced but he is awake alert and oriented.  Admits to heavy alcohol use tonight which is typical for the patient.  Patient has a left-sided head injury with blood on the left side of his head.   Past Medical History:  Diagnosis Date  . Alcohol abuse   . Chronic systolic (congestive) heart failure ([place])    a. TTE 8/17: EF 20-25%, mild conc LVH, mild MR, mild biatrial enlarge, mild-mod TR, PASP 45, aortic root 39 mm, small posterior pericardial effusion, Afib with RVR w/ HR 120-150 bpm; b. TTE 6/18: EF 60-65%, mod LVH, mild MR, mild biatrial enlarge, mildly dilated RV, midlly reduced RVSF, mild-mod TR  . Essential hypertension   . Persistent atrial fibrillation    a. on Xarelto; b. CHADS2VASc 3 (CHF, HTN, age x 1)  . Skin cancer of face    "under left eye"    Patient Active Problem List   Diagnosis Date Noted  . Visit for monitoring Tikosyn therapy 08/27/2017  . Atrial fibrillation ([place]) 07/26/2017  . Congestive dilated cardiomyopathy ([place]) 11/05/2015  . Atrial fibrillation, persistent 10/12/2015  . Alcohol abuse 10/04/2015  . Bilateral  edema of lower extremity 10/04/2015  . Abnormal liver enzymes 01/06/2015  . Essential (primary) hypertension 01/06/2015  . Pure hypercholesterolemia 01/06/2015  . Basal cell carcinoma of face 12/30/2014    Past Surgical History:  Procedure Laterality Date  . KNEE ARTHROSCOPY Bilateral X 3   "twice on the left"  . MOHS SURGERY     "under left eye"  . TONSILLECTOMY      Prior to Admission medications   Medication Sig Start Date End Date Taking? Authorizing Provider  diltiazem (CARDIZEM CD) 180 MG 24 hr capsule Take 1 capsule (180 mg total) by mouth daily. Patient taking differently: Take 180 mg by mouth 2 (two) times daily.  08/30/17  Yes Baldwin Jamaica, PA-C  dofetilide (TIKOSYN) 250 MCG capsule TAKE 1 CAPSULE (250 MCG) BY MOUTH TWICE DAILY - APPT. REQUIRED FOR REFILLS 04/08/18  Yes Sherran Needs, NP  magnesium oxide (MAG-OX) 400 (241.3 Mg) MG tablet Take 1 tablet (400 mg total) by mouth 2 (two) times daily. 08/30/17  Yes Baldwin Jamaica, PA-C  metoprolol succinate (TOPROL-XL) 100 MG 24 hr tablet Take 1 tablet (100 mg total) by mouth daily. 08/30/17  Yes Baldwin Jamaica, PA-C  sacubitril-valsartan (ENTRESTO) 24-26 MG Take 1 tablet by mouth 2 (two) times daily. 07/10/17  Yes Dunn, Ryan M, PA-C  XARELTO 20 MG TABS tablet TAKE ONE TABLET EVERY DAY WITH SUPPER 04/12/18  Yes  Minna Merritts, MD    No Known Allergies  Family History  Problem Relation Age of Onset  . Dementia Mother   . Heart disease Father   . Heart attack Father     Social History Social History   Tobacco Use  . Smoking status: Never Smoker  . Smokeless tobacco: Never Used  Substance Use Topics  . Alcohol use: Yes    Alcohol/week: 2.0 standard drinks    Types: 2 Glasses of wine per week  . Drug use: No    Review of Systems Constitutional: Denies loss of consciousness. Eyes: Negative for visual complaints Cardiovascular: Negative for chest pain. Respiratory: Negative for shortness of  breath. Gastrointestinal: Negative for abdominal pain Musculoskeletal: Negative for musculoskeletal complaints Neurological: Denies headache.  Obvious left-sided head trauma All other ROS negative  ____________________________________________   PHYSICAL EXAM:  VITAL SIGNS: ED Triage Vitals  Enc Vitals Group     BP 04/12/18 1840 (!) 80/53     Pulse Rate 04/12/18 1843 (!) 46     Resp 04/12/18 1840 15     Temp 04/12/18 1929 (!) 93.5 F (34.2 C)     Temp Source 04/12/18 1929 Rectal     SpO2 04/12/18 1839 94 %     Weight 04/12/18 1846 185 lb (83.9 kg)     Height 04/12/18 1846 6\' 1"  (1.854 m)     Head Circumference --      Peak Flow --      Pain Score 04/12/18 1846 0     Pain Loc --      Pain Edu? --      Excl. in [place]? --    Constitutional: Alert and oriented. Well appearing and in no distress. Eyes: Normal exam ENT   Head: Normocephalic and atraumatic.   Mouth/Throat: Mucous membranes are moist. Cardiovascular: Normal rate, regular rhythm.  Respiratory: Normal respiratory effort without tachypnea nor retractions. Breath sounds are clear  Gastrointestinal: Soft and nontender. No distention.   Musculoskeletal: Nontender with normal range of motion in all extremities.  Neurologic:  Normal speech and language. No gross focal neurologic deficits  Skin:  Skin is warm, dry and intact.  Psychiatric: Mood and affect are normal.   ____________________________________________    EKG  EKG viewed and interpreted by myself shows sinus bradycardia 48 bpm, appears to have a prolonged QTC, nonspecific ST changes.  Repeat EKG viewed and interpreted by myself 20: 05: 12 shows sinus bradycardia 49 bpm with again prolonged QTC, appears to have some T wave inversions no ST elevation.  ____________________________________________    RADIOLOGY  CT scan of the head and neck are negative for acute abnormality.  ____________________________________________   INITIAL IMPRESSION /  ASSESSMENT AND PLAN / ED COURSE  Pertinent labs & imaging results that were available during my care of the patient were reviewed by me and considered in my medical decision making (see chart for details).  Patient presents emergency department after being found down on the floor by his wife.  Patient is a chronic alcoholic admits to heavy alcohol use today.  Per EMS patient had a heart rate of 40 bpm they came in on external pacer pads, blood pressure in the 60s.  Patient's blood pressure remains in the 60s despite IV hydration.  Patient is EKG is abnormal pulse in the 40s with a prolonged QT, labs are pending.  Patient's labs are finally resulted showing acute renal insufficiency/failure.  Baseline creatinine around 1.4 currently greater than 3.  We will  continue with IV hydration.  Patient's potassium is 6.0 this is after 2 to 3 L of IV fluids as the patient needed a recollect on his chemistry.  I suspect a great deal the patient's symptoms are likely related to his hyperkalemia including possible QTC prolongation.  Patient is found to be hypothermic, currently 93.5 degrees, bear hugger initiated.  This could likely be contributing to the patient's bradycardia and symptoms as well.  Although the patient's blood pressure remains low the patient is mentating extremely well answering all questions appropriately.  We will start a bicarbonate drip for the patient.  We will dose insulin and glucose and continue with hourly fingersticks and monitor glucose.  We will start on norepinephrine peripherally until the patient's potassium is adequately treated, and patient becomes normotensive anticipate we will be able to take him off of pressors shortly.  Continues to mentate extremely well but blood pressure remains in the 76H to 70 systolic.   I placed a central line given continued hypotension.  Patient continues to [facility] very well.  We will start the patient on norepinephrine after line is confirmed by  x-ray.  CENTRAL LINE Performed by: Harvest Dark Consent: The procedure was performed in an emergent situation. Required items: required blood products, implants, devices, and special equipment available Patient identity confirmed: arm band and provided demographic data Time out: Immediately prior to procedure a "time out" was called to verify the correct patient, procedure, equipment, support staff and site/side marked as required. Indications: vascular access Anesthesia: local infiltration Local anesthetic: lidocaine 1% with epinephrine Anesthetic total: 3 ml Patient sedated: no Preparation: skin prepped with 2% chlorhexidine Skin prep agent dried: skin prep agent completely dried prior to procedure Sterile barriers: all five maximum sterile barriers used - cap, mask, sterile gown, sterile gloves, and large sterile sheet Hand hygiene: hand hygiene performed prior to central venous catheter insertion  Location details: Right internal jugular  Catheter type: triple lumen Catheter size: 8 Fr Pre-procedure: landmarks identified Ultrasound guidance: Yes Successful placement: yes Post-procedure: line sutured and dressing applied Assessment: blood return through all parts, free fluid flow, placement verified by x-ray and no pneumothorax on x-ray Patient tolerance: Patient tolerated the procedure well with no immediate complications.  LACERATION REPAIR Performed by: Harvest Dark Authorized by: Harvest Dark Consent: Verbal consent obtained. Risks and benefits: risks, benefits and alternatives were discussed Consent given by: patient Patient identity confirmed: provided demographic data Prepped and Draped in normal sterile fashion Wound explored  Laceration Location: left scalp  Laceration Length: 4cm  No Foreign Bodies seen or palpated  Irrigation method: saline, gauze Amount of cleaning: standard  Skin closure: staples  Number of staples: 6  Technique:  staples  Patient tolerance: Patient tolerated the procedure well with no immediate complications.    CRITICAL CARE Performed by: Harvest Dark   Total critical care time: 90 minutes  Critical care time was exclusive of separately billable procedures and treating other patients.  Critical care was necessary to treat or prevent imminent or life-threatening deterioration.  Critical care was time spent personally by me on the following activities: development of treatment plan with patient and/or surrogate as well as nursing, discussions with consultants, evaluation of patient's response to treatment, examination of patient, obtaining history from patient or surrogate, ordering and performing treatments and interventions, ordering and review of laboratory studies, ordering and review of radiographic studies, pulse oximetry and re-evaluation of patient's condition.   ____________________________________________   FINAL CLINICAL IMPRESSION(S) / ED DIAGNOSES  Hypotension  Hypothermia Fall Acute renal insufficiency Hyperkalemia   Harvest Dark, MD 04/12/18 2054    Harvest Dark, MD 04/12/18 2122    Harvest Dark, MD 04/12/18 [phone]

## 2018-04-12 NOTE — ED Notes (Signed)
Dr Kerman Passey notified of heart rate of 40 and BP of 52/37. Verbal order for atropine given. Pt is conscious and talking.

## 2018-04-13 ENCOUNTER — Inpatient Hospital Stay: Payer: Medicare Other

## 2018-04-13 DIAGNOSIS — F1010 Alcohol abuse, uncomplicated: Secondary | ICD-10-CM

## 2018-04-13 LAB — BASIC METABOLIC PANEL
ANION GAP: 20 — AB (ref 5–15)
Anion gap: 22 — ABNORMAL HIGH (ref 5–15)
BUN: 36 mg/dL — ABNORMAL HIGH (ref 8–23)
BUN: 33 mg/dL — AB (ref 8–23)
CHLORIDE: 103 mmol/L (ref 98–111)
CO2: 14 mmol/L — ABNORMAL LOW (ref 22–32)
CO2: 16 mmol/L — ABNORMAL LOW (ref 22–32)
Calcium: 7.1 mg/dL — ABNORMAL LOW (ref 8.9–10.3)
Calcium: 7.4 mg/dL — ABNORMAL LOW (ref 8.9–10.3)
Chloride: 98 mmol/L (ref 98–111)
Creatinine, Ser: 2.54 mg/dL — ABNORMAL HIGH (ref 0.61–1.24)
Creatinine, Ser: 2.18 mg/dL — ABNORMAL HIGH (ref 0.61–1.24)
GFR calc Af Amer: 29 mL/min — ABNORMAL LOW (ref >60)
GFR calc Af Amer: 35 mL/min — ABNORMAL LOW (ref >60)
GFR calc non Af Amer: 25 mL/min — ABNORMAL LOW (ref >60)
GFR calc non Af Amer: 30 mL/min — ABNORMAL LOW (ref >60)
Glucose, Bld: 86 mg/dL (ref 70–99)
Glucose, Bld: 96 mg/dL (ref 70–99)
Potassium: 5.1 mmol/L (ref 3.5–5.1)
Potassium: 5.1 mmol/L (ref 3.5–5.1)
Sodium: 137 mmol/L (ref 135–145)
Sodium: 136 mmol/L (ref 135–145)

## 2018-04-13 LAB — CBC
HCT: 32.4 % — ABNORMAL LOW (ref 39.0–52.0)
Hemoglobin: 10.8 g/dL — ABNORMAL LOW (ref 13.0–17.0)
MCH: 35 pg — ABNORMAL HIGH (ref 26.0–34.0)
MCHC: 33.3 g/dL (ref 30.0–36.0)
MCV: 104.9 fL — ABNORMAL HIGH (ref 80.0–100.0)
NRBC: 0 % (ref 0.0–0.2)
Platelets: 92 10*3/uL — ABNORMAL LOW (ref 150–400)
RBC: 3.09 MIL/uL — ABNORMAL LOW (ref 4.22–5.81)
RDW: 13.4 % (ref 11.5–15.5)
WBC: 6.5 10*3/uL (ref 4.0–10.5)

## 2018-04-13 LAB — GLUCOSE, CAPILLARY
GLUCOSE-CAPILLARY: 91 mg/dL (ref 70–99)
GLUCOSE-CAPILLARY: 145 mg/dL — AB (ref 70–99)
Glucose-Capillary: 100 mg/dL — ABNORMAL HIGH (ref 70–99)
Glucose-Capillary: 113 mg/dL — ABNORMAL HIGH (ref 70–99)
Glucose-Capillary: 142 mg/dL — ABNORMAL HIGH (ref 70–99)
Glucose-Capillary: 84 mg/dL (ref 70–99)
Glucose-Capillary: 88 mg/dL (ref 70–99)
Glucose-Capillary: 92 mg/dL (ref 70–99)
Glucose-Capillary: 99 mg/dL (ref 70–99)
Glucose-Capillary: 99 mg/dL (ref 70–99)

## 2018-04-13 LAB — CORTISOL: Cortisol, Plasma: 20.5 ug/dL

## 2018-04-13 LAB — BLOOD GAS, ARTERIAL
Acid-base deficit: 4.5 mmol/L — ABNORMAL HIGH (ref 0.0–2.0)
Bicarbonate: 16.8 mmol/L — ABNORMAL LOW (ref 20.0–28.0)
FIO2: 0.21
O2 Saturation: 94.4 %
PCO2 ART: 22 mmHg — AB (ref 32.0–48.0)
Patient temperature: 37
pH, Arterial: 7.49 — ABNORMAL HIGH (ref 7.350–7.450)
pO2, Arterial: 66 mmHg — ABNORMAL LOW (ref 83.0–108.0)

## 2018-04-13 LAB — MAGNESIUM: Magnesium: 2.1 mg/dL (ref 1.7–2.4)

## 2018-04-13 LAB — MRSA PCR SCREENING: MRSA by PCR: NEGATIVE

## 2018-04-13 LAB — ALBUMIN: Albumin: 3.3 g/dL — ABNORMAL LOW (ref 3.5–5.0)

## 2018-04-13 LAB — PHOSPHORUS: PHOSPHORUS: 3.6 mg/dL (ref 2.5–4.6)

## 2018-04-13 MED ORDER — THIAMINE HCL 100 MG/ML IJ SOLN
Freq: Once | INTRAVENOUS | Status: AC
  Administered 2018-04-13: 02:00:00 via INTRAVENOUS
  Filled 2018-04-13: qty 1000

## 2018-04-13 MED ORDER — DIAZEPAM 5 MG PO TABS
5.0000 mg | ORAL_TABLET | Freq: Four times a day (QID) | ORAL | Status: DC | PRN
  Administered 2018-04-13: 5 mg via ORAL

## 2018-04-13 MED ORDER — VANCOMYCIN HCL 10 G IV SOLR: 1500.0000 mg | INTRAVENOUS | Status: DC

## 2018-04-13 MED ORDER — FENTANYL CITRATE (PF) 100 MCG/2ML IJ SOLN
25.0000 ug | Freq: Once | INTRAMUSCULAR | Status: AC
  Administered 2018-04-13: 25 ug via INTRAVENOUS

## 2018-04-13 MED ORDER — HALOPERIDOL LACTATE 5 MG/ML IJ SOLN
INTRAMUSCULAR | Status: AC
  Administered 2018-04-13: 2.5 mg via INTRAVENOUS
  Filled 2018-04-13: qty 1

## 2018-04-13 MED ORDER — DEXMEDETOMIDINE HCL IN NACL 400 MCG/100ML IV SOLN
0.4000 ug/kg/h | INTRAVENOUS | Status: DC
  Administered 2018-04-13: 0.4 ug/kg/h via INTRAVENOUS
  Administered 2018-04-13: 0.8 ug/kg/h via INTRAVENOUS
  Administered 2018-04-14: 0.6 ug/kg/h via INTRAVENOUS
  Administered 2018-04-14: 0.3 ug/kg/h via INTRAVENOUS
  Administered 2018-04-14: 0.4 ug/kg/h via INTRAVENOUS
  Administered 2018-04-15: 0.6 ug/kg/h via INTRAVENOUS
  Administered 2018-04-15: 0.5 ug/kg/h via INTRAVENOUS
  Filled 2018-04-13 (×6): qty 100

## 2018-04-13 MED ORDER — DIAZEPAM 5 MG PO TABS
ORAL_TABLET | ORAL | Status: AC
  Administered 2018-04-13: 5 mg via ORAL
  Filled 2018-04-13: qty 1

## 2018-04-13 MED ORDER — SODIUM CHLORIDE 0.9 % IV SOLN
1.0000 g | Freq: Two times a day (BID) | INTRAVENOUS | Status: DC
  Filled 2018-04-13 (×2): qty 1

## 2018-04-13 MED ORDER — LORAZEPAM 2 MG/ML IJ SOLN
2.0000 mg | Freq: Once | INTRAMUSCULAR | Status: AC
  Administered 2018-04-13: 2 mg via INTRAVENOUS

## 2018-04-13 MED ORDER — HALOPERIDOL LACTATE 5 MG/ML IJ SOLN
2.5000 mg | Freq: Once | INTRAMUSCULAR | Status: AC
  Administered 2018-04-13: 2.5 mg via INTRAVENOUS

## 2018-04-13 MED ORDER — FENTANYL CITRATE (PF) 100 MCG/2ML IJ SOLN
INTRAMUSCULAR | Status: AC
  Filled 2018-04-13: qty 2

## 2018-04-13 MED ORDER — PHENOBARBITAL 32.4 MG PO TABS
48.6000 mg | ORAL_TABLET | Freq: Three times a day (TID) | ORAL | Status: DC
  Administered 2018-04-13: 48.6 mg via ORAL
  Filled 2018-04-13 (×3): qty 2

## 2018-04-13 MED ORDER — CALCIUM GLUCONATE-NACL 1-0.675 GM/50ML-% IV SOLN
1.0000 g | Freq: Once | INTRAVENOUS | Status: AC
  Administered 2018-04-13: 1000 mg via INTRAVENOUS
  Filled 2018-04-13 (×2): qty 50

## 2018-04-13 MED ORDER — LORAZEPAM 2 MG/ML IJ SOLN
INTRAMUSCULAR | Status: AC
  Filled 2018-04-13: qty 1

## 2018-04-13 MED ORDER — PANTOPRAZOLE SODIUM 40 MG PO TBEC
40.0000 mg | DELAYED_RELEASE_TABLET | Freq: Every day | ORAL | Status: DC
  Administered 2018-04-13 – 2018-04-15 (×3): 40 mg via ORAL
  Filled 2018-04-13 (×3): qty 1

## 2018-04-13 NOTE — Progress Notes (Signed)
Patient with detox tremor, increased confusion, attempting to climb out of bed with no success of reorienting, increased anxiety.  Dana notified and orders obtained for phenobarbital.

## 2018-04-13 NOTE — Consult Note (Addendum)
PULMONARY / CRITICAL CARE MEDICINE  Name: Patrick Roberts MRN: [id number] DOB: 05-16-1950    LOS: 1  Referring Provider: Dr. Posey Pronto Reason for Referral: Fall with head injury and hypotension  HPI: 68 year old male with a known history of alcohol abuse, congestive heart failure, hypertension, atrial fibrillation on Xarelto and skin cancer who presented to the ED following a traumatic fall at home with left head laceration.  Patient states that he has been drinking when he tried to go to the bathroom and fell and hit his head.  At the ED, he was found to have about a 5 cm laceration on the left side of his head.  His CT head was negative for any acute intracranial process.  He was found to be hypotensive, hypothermic and bradycardic.  He was given multiple fluid boluses but his systolic blood pressure remained below 80 mmHg hence he was admitted to the ICU on pressors.  He is awake and denies chest pain, shortness of breath, but reports left scalp pain at the location of the laceration.  He is requesting referral for alcohol treatment.  Past Medical History:  Diagnosis Date  . Alcohol abuse   . Chronic systolic (congestive) heart failure ([place])    a. TTE 8/17: EF 20-25%, mild conc LVH, mild MR, mild biatrial enlarge, mild-mod TR, PASP 45, aortic root 39 mm, small posterior pericardial effusion, Afib with RVR w/ HR 120-150 bpm; b. TTE 6/18: EF 60-65%, mod LVH, mild MR, mild biatrial enlarge, mildly dilated RV, midlly reduced RVSF, mild-mod TR  . Essential hypertension   . Persistent atrial fibrillation    a. on Xarelto; b. CHADS2VASc 3 (CHF, HTN, age x 1)  . Skin cancer of face    "under left eye"   Past Surgical History:  Procedure Laterality Date  . KNEE ARTHROSCOPY Bilateral X 3   "twice on the left"  . MOHS SURGERY     "under left eye"  . TONSILLECTOMY     Prior to Admission medications   Medication Sig Start Date End Date Taking? Authorizing Provider  amLODipine (NORVASC) 5 MG tablet  Take 5 mg by mouth daily.   Yes [provider]  clopidogrel (PLAVIX) 75 MG tablet Take 75 mg by mouth daily.   Yes [provider]  donepezil (ARICEPT) 5 MG tablet Take 1 tablet (5 mg total) by mouth at bedtime. 10/08/17 11/17/17 Yes Sowles, Drue Stager, MD  empagliflozin (JARDIANCE) 25 MG TABS tablet Take 25 mg by mouth daily.   Yes [provider]  glycopyrrolate (ROBINUL) 1 MG tablet Take 1 mg by mouth 2 (two) times daily.   Yes [provider]  insulin aspart (NOVOLOG FLEXPEN) 100 UNIT/ML FlexPen Inject 12 Units into the skin 2 (two) times daily.   Yes [provider]  insulin aspart (NOVOLOG) 100 UNIT/ML FlexPen Inject 18 Units into the skin daily. At 1700   Yes [provider]  Insulin Degludec-Liraglutide (XULTOPHY) 100-3.6 UNIT-MG/ML SOPN Inject 50 Units into the skin daily.   Yes [provider]  levETIRAcetam (KEPPRA) 500 MG tablet Take 500 mg by mouth 2 (two) times daily.   Yes [provider]  lipase/protease/amylase (CREON) 12000 units CPEP capsule Take 6,000 Units by mouth 3 (three) times daily before meals.   Yes [provider]  lipase/protease/amylase (CREON) 12000 units CPEP capsule Take 3,000 Units by mouth at bedtime. With snack   Yes [provider]  lisinopril (PRINIVIL,ZESTRIL) 5 MG tablet Take 5 mg by mouth daily.  Yes [provider]  metoprolol succinate (TOPROL-XL) 25 MG 24 hr tablet Take 1 tablet (25 mg total) by mouth daily. 10/08/17  Yes Sowles, Drue Stager, MD  rosuvastatin (CRESTOR) 40 MG tablet Take 1 tablet (40 mg total) by mouth daily. 10/08/17 11/17/17 Yes Steele Sizer, MD  aspirin EC 81 MG tablet Take 81 mg by mouth daily.    [provider]  famotidine (PEPCID) 20 MG tablet Take 1 tablet (20 mg total) by mouth 2 (two) times daily. 10/08/17 11/07/17  Steele Sizer, MD  gabapentin (NEURONTIN) 300 MG capsule Take 1 capsule (300 mg total) by mouth 2 (two) times daily.  10/08/17 11/07/17  Steele Sizer, MD  insulin glargine (LANTUS) 100 UNIT/ML injection Inject 0.1 mLs (10 Units total) into the skin daily. 10/08/17 11/07/17  Steele Sizer, MD  lacosamide 100 MG TABS Take 1 tablet (100 mg total) by mouth 2 (two) times daily. Patient not taking: Reported on 11/17/2017 02/23/17   Fritzi Mandes, MD  promethazine (PHENERGAN) 12.5 MG tablet Take 1 tablet (12.5 mg total) by mouth every 6 (six) hours as needed for nausea or vomiting. Patient not taking: Reported on 11/17/2017 12/12/16   Stark Klein, MD  sertraline (ZOLOFT) 25 MG tablet Take 1 tablet (25 mg total) by mouth daily. Patient not taking: Reported on 11/17/2017 10/08/17   Steele Sizer, MD   Allergies No Known Allergies  Family History Family History  Problem Relation Age of Onset  . Dementia Mother   . Heart disease Father   . Heart attack Father    Social History  reports that he has never smoked. He has never used smokeless tobacco. He reports current alcohol use of about 2.0 standard drinks of alcohol per week. He reports that he does not use drugs.  Review Of Systems:   Constitutional: Negative for fever and chills.  HENT: Negative for congestion and rhinorrhea.  Eyes: Negative for redness and visual disturbance.  Respiratory: Negative for shortness of breath and wheezing.  Cardiovascular: Negative for chest pain and palpitations.  Gastrointestinal: Negative  for nausea , vomiting and abdominal pain and  loose stools Genitourinary: Negative for dysuria and urgency.  Endocrine: Denies polyuria, polyphagia and heat intolerance Musculoskeletal: Reports pain on the left side of his head due to scalp laceration Skin: Negative for pallor and wound.  Neurological: Negative for dizziness and headaches but reports mild tremors  VITAL SIGNS: BP 104/64   Pulse 62   Temp 98 F (36.7 C) (Oral)   Resp 14   Ht 6\' 1"  (1.854 m)   Wt 84.1 kg   SpO2 95%   BMI 24.46 kg/m   HEMODYNAMICS:     VENTILATOR SETTINGS:    INTAKE / OUTPUT: No intake/output data recorded.  PHYSICAL EXAMINATION: General: Acutely ill looking, disheveled HEENT: PERRLA, trachea midline, no JVD, left scalp laceration with staples in place, mild bleeding noted Neuro: Alert and oriented x2, speech is normal, intermittent confusion, moves all extremities. Cardiovascular: Apical pulse mildly bradycardic, S1-S2, no murmur regurg or gallop, +2 pulses bilaterally, no edema Lungs: Bilateral breath sounds without any wheezes or rhonchi Abdomen: None distended, normal bowel sounds in all 4 quadrants, palpation reveals no organomegaly Musculoskeletal: Positive range of motion, some small lacerations in bilateral knees Skin: Multiple bruises in bilateral upper and lower extremities  LABS:  BMET Recent Labs  Lab 04/12/18 2000 04/13/18 0402  NA 133* 137  K 6.0* 5.1  CL 104 103  CO2 11* 14*  BUN 40* 36*  CREATININE 3.07*  2.54*  GLUCOSE 59* 86    Electrolytes Recent Labs  Lab 04/12/18 2000 04/13/18 0402  CALCIUM 7.0* 7.1*    CBC Recent Labs  Lab 04/12/18 1848 04/12/18 2244 04/13/18 0402  WBC 6.3  --  6.5  HGB 12.4* 11.9* 10.8*  HCT 38.0*  --  32.4*  PLT 102*  --  92*    Coag's No results for input(s): APTT, INR in the last 168 hours.  Sepsis Markers Recent Labs  Lab 04/12/18 2057 04/12/18 2244  LATICACIDVEN 3.6* 3.0*    ABG No results for input(s): PHART, PCO2ART, PO2ART in the last 168 hours.  Liver Enzymes Recent Labs  Lab 04/12/18 2000  AST 64*  ALT 37  ALKPHOS 53  BILITOT 1.2  ALBUMIN 3.2*    Cardiac Enzymes Recent Labs  Lab 04/12/18 2000  TROPONINI <0.03    Glucose Recent Labs  Lab 04/12/18 2335 04/12/18 2350 04/13/18 0020 04/13/18 0200 04/13/18 0400 04/13/18 0553  GLUCAP 39* 37* 100* 99 84 88    Imaging Ct Head Wo Contrast  Result Date: 04/12/2018 CLINICAL DATA:  Fall. EXAM: CT HEAD WITHOUT CONTRAST CT CERVICAL SPINE WITHOUT CONTRAST  TECHNIQUE: Multidetector CT imaging of the head and cervical spine was performed following the standard protocol without intravenous contrast. Multiplanar CT image reconstructions of the cervical spine were also generated. COMPARISON:  None. FINDINGS: CT HEAD FINDINGS Brain: There is no evidence of acute infarct, intracranial hemorrhage, mass, midline shift, or extra-axial fluid collection. There is mild cerebral atrophy. Cerebral white matter hypodensities are nonspecific but compatible with mild chronic small vessel ischemic disease. Vascular: Calcified atherosclerosis at the skull base. No hyperdense vessel. Skull: No fracture or focal osseous lesion. Sinuses/Orbits: Visualized paranasal sinuses are clear. Trace right mastoid effusion. Unremarkable orbits. Other: Small left lateral parietal scalp hematoma with foci of gas consistent with laceration. CT CERVICAL SPINE FINDINGS Alignment: Reversal of the cervical lordosis. Grade 1 anterolisthesis of C3 on C4, C4 on C5, and C7 on T1. Grade 1 retrolisthesis of C5 on C6. Skull base and vertebrae: No acute fracture. Moderate C1-2 arthropathy with cystic changes in the base of the dens. Soft tissues and spinal canal: No prevertebral fluid or swelling. No visible canal hematoma. Disc levels: Advanced disc degeneration from C3-4-C6-7 with disc space narrowing most severe at C5-6 and C6-7. Severe facet arthrosis bilaterally at C3-4 and C7-T1 and on the right at C4-5. Erosive facet changes at C3-4. Moderate multilevel neural foraminal stenosis. No osseous spinal canal stenosis. Upper chest: Clear lung apices. Other: None. IMPRESSION: 1. No evidence of acute intracranial abnormality. 2. Mild chronic small vessel ischemic disease and cerebral atrophy. 3. Left parietal scalp hematoma. 4. No acute cervical spine fracture. Advanced disc and facet degeneration. Electronically Signed   By: Logan Bores M.D.   On: 04/12/2018 19:17   Ct Cervical Spine Wo Contrast  Result Date:  04/12/2018 CLINICAL DATA:  Fall. EXAM: CT HEAD WITHOUT CONTRAST CT CERVICAL SPINE WITHOUT CONTRAST TECHNIQUE: Multidetector CT imaging of the head and cervical spine was performed following the standard protocol without intravenous contrast. Multiplanar CT image reconstructions of the cervical spine were also generated. COMPARISON:  None. FINDINGS: CT HEAD FINDINGS Brain: There is no evidence of acute infarct, intracranial hemorrhage, mass, midline shift, or extra-axial fluid collection. There is mild cerebral atrophy. Cerebral white matter hypodensities are nonspecific but compatible with mild chronic small vessel ischemic disease. Vascular: Calcified atherosclerosis at the skull base. No hyperdense vessel. Skull: No fracture or focal osseous lesion. Sinuses/Orbits:  Visualized paranasal sinuses are clear. Trace right mastoid effusion. Unremarkable orbits. Other: Small left lateral parietal scalp hematoma with foci of gas consistent with laceration. CT CERVICAL SPINE FINDINGS Alignment: Reversal of the cervical lordosis. Grade 1 anterolisthesis of C3 on C4, C4 on C5, and C7 on T1. Grade 1 retrolisthesis of C5 on C6. Skull base and vertebrae: No acute fracture. Moderate C1-2 arthropathy with cystic changes in the base of the dens. Soft tissues and spinal canal: No prevertebral fluid or swelling. No visible canal hematoma. Disc levels: Advanced disc degeneration from C3-4-C6-7 with disc space narrowing most severe at C5-6 and C6-7. Severe facet arthrosis bilaterally at C3-4 and C7-T1 and on the right at C4-5. Erosive facet changes at C3-4. Moderate multilevel neural foraminal stenosis. No osseous spinal canal stenosis. Upper chest: Clear lung apices. Other: None. IMPRESSION: 1. No evidence of acute intracranial abnormality. 2. Mild chronic small vessel ischemic disease and cerebral atrophy. 3. Left parietal scalp hematoma. 4. No acute cervical spine fracture. Advanced disc and facet degeneration. Electronically Signed    By: Logan Bores M.D.   On: 04/12/2018 19:17   Dg Chest Port 1 View  Result Date: 04/12/2018 CLINICAL DATA:  Central line placement EXAM: PORTABLE CHEST 1 VIEW COMPARISON:  None. FINDINGS: Right internal jugular central line is in place with the tip in the SVC. No pneumothorax. Bibasilar atelectasis or infiltrates. Heart is borderline in size. No effusions or acute bony abnormality. IMPRESSION: Right central line placement with the tip in the SVC. No pneumothorax. Bibasilar atelectasis or infiltrates. Electronically Signed   By: Rolm Baptise M.D.   On: 04/12/2018 21:52    SIGNIFICANT EVENTS: 04/12/2018: Admitted  LINES/TUBES: Peripheral IVs   ASSESSMENT Acute alcohol intoxication Acute renal failure Elevated LFTs Hypovolemic shock Lactic acidosis secondary to alcoholism Hyperkalemia Hypocalcemia Thrombocytopenia Left scalp laceration Severe alcohol abuse with high potential for withdrawal Atrial fibrillation Hypertension Chronic systolic heart failure    PLAN Hemodynamic monitoring per ICU protocol CIWA protocol Banana bag x1 Continue with oral thiamine folic acid and multivitamin daily IV fluids and pressors to maintain mean arterial blood pressure greater than 65 Trend lactic acid Monitor and correct electrolytes Social service referral for alcohol treatment once stable Neurochecks per protocol Trend creatinine Hold Xarelto in light of fall with head injury Resume home antihypertensives  Best Practice: Code Status: Full code Diet: Regular diet GI prophylaxis: Protonix 40 mg daily VTE prophylaxis: SCDs, Xarelto on hold  FAMILY  - Updates:   Magdalene S. [facility] ANP-BC Pulmonary and Critical Care Medicine [facility] Pager [phone] or [phone]  NB: This document was prepared using Dragon voice recognition software and may include unintentional dictation errors.    04/13/2018, 6:45 AM

## 2018-04-13 NOTE — Progress Notes (Signed)
Pharmacy Antibiotic Note  Patrick Roberts is a 68 y.o. male admitted on 04/12/2018 with pneumonia.  Pharmacy has been consulted for vanc/cefepime dosing.  Plan: Patient received vanc 2g IV load and cefepime 2g IV x 1 in ED  Vancomycin 1500 mg IV Q 48 hrs. Goal AUC 400-550. Expected AUC: 476.4 SCr used: 3.07 mg/dL  Will continue cefepime 1g IV q12h per CrCl 11 - 29 ml/min  Height: 6\' 1"  (628.3 cm) Weight: 185 lb 6.5 oz (84.1 kg) IBW/kg (Calculated) : 79.9  Temp (24hrs), Avg:95.3 F (35.2 C), Min:93.5 F (34.2 C), Max:97.7 F (36.5 C)  Recent Labs  Lab 04/12/18 1848 04/12/18 2000 04/12/18 2057 04/12/18 2244  WBC 6.3  --   --   --   CREATININE  --  3.07*  --   --   LATICACIDVEN  --   --  3.6* 3.0*    Estimated Creatinine Clearance: 26 mL/min (A) (by C-G formula based on SCr of 3.07 mg/dL (H)).    No Known Allergies  Thank you for allowing pharmacy to be a part of this patient's care.  Tobie Lords, PharmD, BCPS Clinical Pharmacist 04/13/2018

## 2018-04-13 NOTE — Progress Notes (Signed)
No improvement noted aft dosed of phenobarbital.  Precedex drip started and one time order of haldol given.

## 2018-04-13 NOTE — Progress Notes (Signed)
Patient admitted for fall and laceration s/t EtOH intoxication. Patient has a h/o of afib and concomitant HF of which he takes xarelto for anticoagulation and tikosyn for rate control.  Patient's CrCl is 26 ml/min s/t post-renal dehydration; patient also is bradycardic w/ HR 20 - 30's and a QTc of 617 on the most recent EKG.  As a result recommended to NP to discontinue tikosyn for now. NP notified and agrees with plan.  Patient takes tikosyn 250 mg capsule twice daily according to PTA med rec. Pharmacy to please follow-up and restart if appropriate.  Tobie Lords, PharmD, BCPS Clinical Pharmacist 04/13/2018

## 2018-04-13 NOTE — Progress Notes (Signed)
[place] [facility] [place] NAME: Patrick Roberts    MR#:  [id number]  DATE OF BIRTH:  Sep 03, 1950  SUBJECTIVE:   Patient states that he is feeling okay this morning.  No acute events overnight.  Denies any chest pain, palpitations, headaches.  REVIEW OF SYSTEMS:  Review of Systems  Constitutional: Negative for chills and fever.  HENT: Negative for congestion and sore throat.   Eyes: Negative for blurred vision and double vision.  Respiratory: Negative for cough and shortness of breath.   Cardiovascular: Negative for chest pain and palpitations.  Gastrointestinal: Negative for nausea and vomiting.  Genitourinary: Negative for dysuria and urgency.  Musculoskeletal: Positive for falls. Negative for back pain and neck pain.  Neurological: Negative for dizziness and headaches.  Psychiatric/Behavioral: Negative for depression. The patient is not nervous/anxious.     DRUG ALLERGIES:  No Known Allergies VITALS:  Blood pressure 103/62, pulse 63, temperature 98.4 F (36.9 C), temperature source Oral, resp. rate (!) 24, height 6\' 1"  (1.854 m), weight 84.1 kg, SpO2 96 %. PHYSICAL EXAMINATION:  Physical Exam  GENERAL:  68 y.o.-year-old patient lying in the bed with no acute distress.  EYES: Pupils equal, round, reactive to light and accommodation. No scleral icterus. Extraocular muscles intact.  HEENT: Head atraumatic, normocephalic. Oropharynx and nasopharynx clear.  NECK:  Supple, no jugular venous distention. No thyroid enlargement, no tenderness.  LUNGS: Normal breath sounds bilaterally, no wheezing, rales,rhonchi or crepitation. No use of accessory muscles of respiration.  CARDIOVASCULAR: S1, S2 normal. No murmurs, rubs, or gallops.  ABDOMEN: Soft, nontender, nondistended. Bowel sounds present. No organomegaly or mass.  EXTREMITIES: No pedal edema, cyanosis, or clubbing.  NEUROLOGIC: Cranial nerves II through XII are intact. Muscle strength 5/5 in  all extremities. Sensation intact. Gait not checked. + Tremulous PSYCHIATRIC: The patient is alert and oriented x 3.  SKIN: + Laceration to posterior scalp  LABORATORY PANEL:  Male CBC Recent Labs  Lab 04/13/18 0402  WBC 6.5  HGB 10.8*  HCT 32.4*  PLT 92*   ------------------------------------------------------------------------------------------------------------------ Chemistries  Recent Labs  Lab 04/12/18 2000 04/13/18 0402 04/13/18 1309  NA 133* 137 136  K 6.0* 5.1 5.1  CL 104 103 98  CO2 11* 14* 16*  GLUCOSE 59* 86 96  BUN 40* 36* 33*  CREATININE 3.07* 2.54* 2.18*  CALCIUM 7.0* 7.1* 7.4*  MG  --  2.1  --   AST 64*  --   --   ALT 37  --   --   ALKPHOS 53  --   --   BILITOT 1.2  --   --    RADIOLOGY:  Ct Head Wo Contrast  Result Date: 04/12/2018 CLINICAL DATA:  Fall. EXAM: CT HEAD WITHOUT CONTRAST CT CERVICAL SPINE WITHOUT CONTRAST TECHNIQUE: Multidetector CT imaging of the head and cervical spine was performed following the standard protocol without intravenous contrast. Multiplanar CT image reconstructions of the cervical spine were also generated. COMPARISON:  None. FINDINGS: CT HEAD FINDINGS Brain: There is no evidence of acute infarct, intracranial hemorrhage, mass, midline shift, or extra-axial fluid collection. There is mild cerebral atrophy. Cerebral white matter hypodensities are nonspecific but compatible with mild chronic small vessel ischemic disease. Vascular: Calcified atherosclerosis at the skull base. No hyperdense vessel. Skull: No fracture or focal osseous lesion. Sinuses/Orbits: Visualized paranasal sinuses are clear. Trace right mastoid effusion. Unremarkable orbits. Other: Small left lateral parietal scalp hematoma with foci of gas consistent with laceration. CT CERVICAL SPINE  FINDINGS Alignment: Reversal of the cervical lordosis. Grade 1 anterolisthesis of C3 on C4, C4 on C5, and C7 on T1. Grade 1 retrolisthesis of C5 on C6. Skull base and vertebrae: No  acute fracture. Moderate C1-2 arthropathy with cystic changes in the base of the dens. Soft tissues and spinal canal: No prevertebral fluid or swelling. No visible canal hematoma. Disc levels: Advanced disc degeneration from C3-4-C6-7 with disc space narrowing most severe at C5-6 and C6-7. Severe facet arthrosis bilaterally at C3-4 and C7-T1 and on the right at C4-5. Erosive facet changes at C3-4. Moderate multilevel neural foraminal stenosis. No osseous spinal canal stenosis. Upper chest: Clear lung apices. Other: None. IMPRESSION: 1. No evidence of acute intracranial abnormality. 2. Mild chronic small vessel ischemic disease and cerebral atrophy. 3. Left parietal scalp hematoma. 4. No acute cervical spine fracture. Advanced disc and facet degeneration. Electronically Signed   By: Logan Bores M.D.   On: 04/12/2018 19:17   Ct Cervical Spine Wo Contrast  Result Date: 04/12/2018 CLINICAL DATA:  Fall. EXAM: CT HEAD WITHOUT CONTRAST CT CERVICAL SPINE WITHOUT CONTRAST TECHNIQUE: Multidetector CT imaging of the head and cervical spine was performed following the standard protocol without intravenous contrast. Multiplanar CT image reconstructions of the cervical spine were also generated. COMPARISON:  None. FINDINGS: CT HEAD FINDINGS Brain: There is no evidence of acute infarct, intracranial hemorrhage, mass, midline shift, or extra-axial fluid collection. There is mild cerebral atrophy. Cerebral white matter hypodensities are nonspecific but compatible with mild chronic small vessel ischemic disease. Vascular: Calcified atherosclerosis at the skull base. No hyperdense vessel. Skull: No fracture or focal osseous lesion. Sinuses/Orbits: Visualized paranasal sinuses are clear. Trace right mastoid effusion. Unremarkable orbits. Other: Small left lateral parietal scalp hematoma with foci of gas consistent with laceration. CT CERVICAL SPINE FINDINGS Alignment: Reversal of the cervical lordosis. Grade 1 anterolisthesis of  C3 on C4, C4 on C5, and C7 on T1. Grade 1 retrolisthesis of C5 on C6. Skull base and vertebrae: No acute fracture. Moderate C1-2 arthropathy with cystic changes in the base of the dens. Soft tissues and spinal canal: No prevertebral fluid or swelling. No visible canal hematoma. Disc levels: Advanced disc degeneration from C3-4-C6-7 with disc space narrowing most severe at C5-6 and C6-7. Severe facet arthrosis bilaterally at C3-4 and C7-T1 and on the right at C4-5. Erosive facet changes at C3-4. Moderate multilevel neural foraminal stenosis. No osseous spinal canal stenosis. Upper chest: Clear lung apices. Other: None. IMPRESSION: 1. No evidence of acute intracranial abnormality. 2. Mild chronic small vessel ischemic disease and cerebral atrophy. 3. Left parietal scalp hematoma. 4. No acute cervical spine fracture. Advanced disc and facet degeneration. Electronically Signed   By: Logan Bores M.D.   On: 04/12/2018 19:17   Dg Chest Port 1 View  Result Date: 04/12/2018 CLINICAL DATA:  Central line placement EXAM: PORTABLE CHEST 1 VIEW COMPARISON:  None. FINDINGS: Right internal jugular central line is in place with the tip in the SVC. No pneumothorax. Bibasilar atelectasis or infiltrates. Heart is borderline in size. No effusions or acute bony abnormality. IMPRESSION: Right central line placement with the tip in the SVC. No pneumothorax. Bibasilar atelectasis or infiltrates. Electronically Signed   By: Rolm Baptise M.D.   On: 04/12/2018 21:52   ASSESSMENT AND PLAN:   1.  Severe hypotension- etiology unclear, although may be secondary to hypovolemia.  Patient did have hypothermia on admission, but sepsis felt to be less likely. -Blood cultures with no growth to date -Continue  pressors -Continue IV fluids -Antibiotics have been discontinued  2.  Alcohol abuse- LFTs elevated. -CIWA protocol -Thiamine, folate, MVI -SW consult for outpatient resources for alcohol rehabilitation  3. Paroxysmal atrial  fibrillation- in NSR here, rate controlled -Hold Xarelto for now -Holding home diltiazem, Tikosyn, metoprolol in the setting of hypotension  4.  Acute renal failure-due to hypotension.  Creatinine improving with fluids. -Nephrology consulted- continue bicarb infusion  5. Hyperkalemia- resolved with lokelma.  6. Macrocytic anemia- likely due to alcoholism  DVT prophylaxis- SCDs in light of bleeding head laceration.  All the records are reviewed and case discussed with Care Management/Social Worker. Management plans discussed with the patient, family and they are in agreement.  CODE STATUS: Full Code  TOTAL TIME TAKING CARE OF THIS PATIENT: 35 minutes.   More than 50% of the time was spent in counseling/coordination of care: YES  POSSIBLE D/C IN 2-3 DAYS, DEPENDING ON CLINICAL CONDITION.   Berna Spare Mayo M.D on 04/13/2018 at 2:44 PM  Between 7am to 6pm - Pager [phone]  After 6pm go to [email or web address] - Proofreader  [facility]  Office  [phone]  CC: Primary care physician; Minna Merritts, MD  Note: This dictation was prepared with Dragon dictation along with smaller phrase technology. Any transcriptional errors that result from this process are unintentional.

## 2018-04-13 NOTE — Progress Notes (Signed)
Patient starting to have hallucinations and is very shaky with mild anxiety.  Dr. Mortimer Fries informed and one time order placed for ativan IV and CIWA protocol to be changed.

## 2018-04-13 NOTE — Consult Note (Signed)
[facility]  CONSULT NOTE    Date: 04/13/2018                  Patient Name:  Patrick Roberts  MRN: [id number]  DOB: [birth year]  Age / Sex: 68 y.o., male         PCP: Minna Merritts, MD                 Service Requesting Consult: Dr. Mortimer Fries                 Reason for Consult: Acute renal failure            History of Present Illness: Mr. Patrick Roberts is a 68 y.o. white male with alcohol abuse, atrial fibrillation, hypertension, congestive heart failure, who was admitted to [facility] on 04/12/2018 for Hyperkalemia [E87.5] Bradycardia [R00.1] Encounter for central line placement [Z45.2] Hypothermia, initial encounter [T68.XXXA] Hypotension, unspecified hypotension type [I95.9]   Patient states he drinks of pint of alcohol a night. He states he fell and then lost consciousness. Found to be hypothermic and hypotensive. Many lacerations and ecchymosis present.   Patient is not a good historian.    Medications: Outpatient medications: Medications Prior to Admission  Medication Sig Dispense Refill Last Dose  . diltiazem (CARDIZEM CD) 180 MG 24 hr capsule Take 1 capsule (180 mg total) by mouth daily. (Patient taking differently: Take 180 mg by mouth 2 (two) times daily. ) 30 capsule 6 04/12/2018 at 1100  . dofetilide (TIKOSYN) 250 MCG capsule TAKE 1 CAPSULE (250 MCG) BY MOUTH TWICE DAILY - APPT. REQUIRED FOR REFILLS 60 capsule 0 04/12/2018 at 1100  . magnesium oxide (MAG-OX) 400 (241.3 Mg) MG tablet Take 1 tablet (400 mg total) by mouth 2 (two) times daily. 60 tablet 6 04/12/2018 at 1100  . metoprolol succinate (TOPROL-XL) 100 MG 24 hr tablet Take 1 tablet (100 mg total) by mouth daily. 30 tablet 6 04/12/2018 at 1100  . sacubitril-valsartan (ENTRESTO) 24-26 MG Take 1 tablet by mouth 2 (two) times daily. 180 tablet 3 04/12/2018 at 1100  . XARELTO 20 MG TABS tablet TAKE ONE TABLET EVERY DAY WITH SUPPER 30 tablet 3 04/11/2018 at 1100    Current medications: Current  Facility-Administered Medications  Medication Dose Route Frequency Provider Last Rate Last Dose  . 0.9 %  sodium chloride infusion   Intravenous Continuous Dustin Flock, MD 125 mL/hr at 04/13/18 0448    . acetaminophen (TYLENOL) tablet 650 mg  650 mg Oral Q6H PRN Dustin Flock, MD       Or  . acetaminophen (TYLENOL) suppository 650 mg  650 mg Rectal Q6H PRN Dustin Flock, MD      . ceFEPIme (MAXIPIME) 1 g in sodium chloride 0.9 % 100 mL IVPB  1 g Intravenous Q12H Dustin Flock, MD      . dextrose 50 % solution           . folic acid (FOLVITE) tablet 1 mg  1 mg Oral Daily Dustin Flock, MD      . LORazepam (ATIVAN) injection 0-4 mg  0-4 mg Intravenous Q6H Harvest Dark, MD   Stopped at 04/12/18 2045   Or  . LORazepam (ATIVAN) tablet 0-4 mg  0-4 mg Oral Q6H Harvest Dark, MD      . Derrill Memo ON 04/15/2018] LORazepam (ATIVAN) injection 0-4 mg  0-4 mg Intravenous Q12H Harvest Dark, MD       Or  . Derrill Memo ON 04/15/2018] LORazepam (ATIVAN)  tablet 0-4 mg  0-4 mg Oral Q12H Paduchowski, Lennette Bihari, MD      . LORazepam (ATIVAN) tablet 1 mg  1 mg Oral Q6H PRN Dustin Flock, MD       Or  . LORazepam (ATIVAN) injection 1 mg  1 mg Intravenous Q6H PRN Dustin Flock, MD   1 mg at 04/13/18 0556  . multivitamin with minerals tablet 1 tablet  1 tablet Oral Daily Dustin Flock, MD   Stopped at 04/12/18 2056  . norepinephrine (LEVOPHED) 4mg  in 249mL premix infusion  0-40 mcg/min Intravenous Continuous Harvest Dark, MD 22.5 mL/hr at 04/13/18 0600 6 mcg/min at 04/13/18 0600  . ondansetron (ZOFRAN) tablet 4 mg  4 mg Oral Q6H PRN Dustin Flock, MD       Or  . ondansetron (ZOFRAN) injection 4 mg  4 mg Intravenous Q6H PRN Dustin Flock, MD      . pantoprazole (PROTONIX) EC tablet 40 mg  40 mg Oral Q1200 Tukov-Yual, Magdalene S, NP      . sodium bicarbonate 150 mEq in sterile water 1,000 mL infusion   Intravenous Continuous Harvest Dark, MD 100 mL/hr at 04/13/18 0818    . thiamine  (VITAMIN B-1) tablet 100 mg  100 mg Oral Daily Harvest Dark, MD       Or  . thiamine (B-1) injection 100 mg  100 mg Intravenous Daily Harvest Dark, MD      . thiamine (VITAMIN B-1) tablet 100 mg  100 mg Oral Daily Dustin Flock, MD       Or  . thiamine (B-1) injection 100 mg  100 mg Intravenous Daily Dustin Flock, MD      . Derrill Memo ON 04/15/2018] vancomycin (VANCOCIN) 1,500 mg in sodium chloride 0.9 % 500 mL IVPB  1,500 mg Intravenous Q48H Tukov-Yual, Magdalene S, NP          Allergies: No Known Allergies    Past Medical History: Past Medical History:  Diagnosis Date  . Alcohol abuse   . Chronic systolic (congestive) heart failure ([place])    a. TTE 8/17: EF 20-25%, mild conc LVH, mild MR, mild biatrial enlarge, mild-mod TR, PASP 45, aortic root 39 mm, small posterior pericardial effusion, Afib with RVR w/ HR 120-150 bpm; b. TTE 6/18: EF 60-65%, mod LVH, mild MR, mild biatrial enlarge, mildly dilated RV, midlly reduced RVSF, mild-mod TR  . Essential hypertension   . Persistent atrial fibrillation    a. on Xarelto; b. CHADS2VASc 3 (CHF, HTN, age x 1)  . Skin cancer of face    "under left eye"     Past Surgical History: Past Surgical History:  Procedure Laterality Date  . KNEE ARTHROSCOPY Bilateral X 3   "twice on the left"  . MOHS SURGERY     "under left eye"  . TONSILLECTOMY       Family History: Family History  Problem Relation Age of Onset  . Dementia Mother   . Heart disease Father   . Heart attack Father      Social History: Social History   Socioeconomic History  . Marital status: Married    Spouse name: Not on file  . Number of children: Not on file  . Years of education: Not on file  . Highest education level: Not on file  Occupational History  . Not on file  Social Needs  . Financial resource strain: Not on file  . Food insecurity:    Worry: Not on file    Inability: Not on file  .  Transportation needs:    Medical: Not on file     Non-medical: Not on file  Tobacco Use  . Smoking status: Never Smoker  . Smokeless tobacco: Never Used  Substance and Sexual Activity  . Alcohol use: Yes    Alcohol/week: 2.0 standard drinks    Types: 2 Glasses of wine per week  . Drug use: No  . Sexual activity: Not on file  Lifestyle  . Physical activity:    Days per week: Not on file    Minutes per session: Not on file  . Stress: Not on file  Relationships  . Social connections:    Talks on phone: Not on file    Gets together: Not on file    Attends religious service: Not on file    Active member of club or organization: Not on file    Attends meetings of clubs or organizations: Not on file    Relationship status: Not on file  . Intimate partner violence:    Fear of current or ex partner: Not on file    Emotionally abused: Not on file    Physically abused: Not on file    Forced sexual activity: Not on file  Other Topics Concern  . Not on file  Social History Narrative  . Not on file     Review of Systems: Review of Systems  Unable to perform ROS: Mental status change    Vital Signs: Blood pressure 105/74, pulse 65, temperature 98.4 F (36.9 C), temperature source Oral, resp. rate (!) 22, height 6\' 1"  (1.854 m), weight 84.1 kg, SpO2 93 %.  Weight trends: Filed Weights   04/12/18 1846 04/13/18 0030  Weight: 83.9 kg 84.1 kg    Physical Exam: General: critically  Head: +lacerations  Eyes: Anicteric, PERRL  Neck: Supple, trachea midline  Lungs:  Clear to auscultation  Heart: Regular rate and rhythm  Abdomen:  Soft, nontender,   Extremities: no peripheral edema.  Neurologic: +tremor, alert to self and place  Skin: No lesions         Lab results: Basic Metabolic Panel: Recent Labs  Lab 04/12/18 2000 04/13/18 0402  NA 133* 137  K 6.0* 5.1  CL 104 103  CO2 11* 14*  GLUCOSE 59* 86  BUN 40* 36*  CREATININE 3.07* 2.54*  CALCIUM 7.0* 7.1*  MG  --  2.1  PHOS  --  3.6    Liver Function  Tests: Recent Labs  Lab 04/12/18 2000  AST 64*  ALT 37  ALKPHOS 53  BILITOT 1.2  PROT 5.5*  ALBUMIN 3.2*   No results for input(s): LIPASE, AMYLASE in the last 168 hours. No results for input(s): AMMONIA in the last 168 hours.  CBC: Recent Labs  Lab 04/12/18 1848 04/12/18 2244 04/13/18 0402  WBC 6.3  --  6.5  HGB 12.4* 11.9* 10.8*  HCT 38.0*  --  32.4*  MCV 105.6*  --  104.9*  PLT 102*  --  92*    Cardiac Enzymes: Recent Labs  Lab 04/12/18 2000  CKTOTAL 129  TROPONINI <0.03    BNP: Invalid input(s): POCBNP  CBG: Recent Labs  Lab 04/13/18 0020 04/13/18 0200 04/13/18 0400 04/13/18 0553 04/13/18 0812  GLUCAP 100* 99 84 88 91    Microbiology: Results for orders placed or performed during the hospital encounter of 04/12/18  CULTURE, BLOOD (ROUTINE X 2) w Reflex to ID Panel     Status: None (Preliminary result)   Collection Time: 04/12/18 10:44 PM  Result Value Ref Range Status   Specimen Description BLOOD RIGHT WRIST  Final   Special Requests   Final    BOTTLES DRAWN AEROBIC AND ANAEROBIC Blood Culture adequate volume   Culture   Final    NO GROWTH < 12 HOURS Performed at [facility], [address] [place], [place] [address]    Report Status PENDING  Incomplete  CULTURE, BLOOD (ROUTINE X 2) w Reflex to ID Panel     Status: None (Preliminary result)   Collection Time: 04/12/18 10:44 PM  Result Value Ref Range Status   Specimen Description BLOOD LEFT ANTECUBITAL  Final   Special Requests   Final    BOTTLES DRAWN AEROBIC AND ANAEROBIC Blood Culture adequate volume   Culture   Final    NO GROWTH < 12 HOURS Performed at [facility], [address], [place], [place] [address]    Report Status PENDING  Incomplete  MRSA PCR Screening     Status: None   Collection Time: 04/12/18 11:38 PM  Result Value Ref Range Status   MRSA by PCR NEGATIVE NEGATIVE Final    Comment:        The GeneXpert MRSA Assay (FDA approved for NASAL  specimens only), is one component of a comprehensive MRSA colonization surveillance program. It is not intended to diagnose MRSA infection nor to guide or monitor treatment for MRSA infections. Performed at [facility], [address] [place], [place] [address]     Coagulation Studies: No results for input(s): LABPROT, INR in the last 72 hours.  Urinalysis: No results for input(s): COLORURINE, LABSPEC, PHURINE, GLUCOSEU, HGBUR, BILIRUBINUR, KETONESUR, PROTEINUR, UROBILINOGEN, NITRITE, LEUKOCYTESUR in the last 72 hours.  Invalid input(s): APPERANCEUR    Imaging: Ct Head Wo Contrast  Result Date: 04/12/2018 CLINICAL DATA:  Fall. EXAM: CT HEAD WITHOUT CONTRAST CT CERVICAL SPINE WITHOUT CONTRAST TECHNIQUE: Multidetector CT imaging of the head and cervical spine was performed following the standard protocol without intravenous contrast. Multiplanar CT image reconstructions of the cervical spine were also generated. COMPARISON:  None. FINDINGS: CT HEAD FINDINGS Brain: There is no evidence of acute infarct, intracranial hemorrhage, mass, midline shift, or extra-axial fluid collection. There is mild cerebral atrophy. Cerebral white matter hypodensities are nonspecific but compatible with mild chronic small vessel ischemic disease. Vascular: Calcified atherosclerosis at the skull base. No hyperdense vessel. Skull: No fracture or focal osseous lesion. Sinuses/Orbits: Visualized paranasal sinuses are clear. Trace right mastoid effusion. Unremarkable orbits. Other: Small left lateral parietal scalp hematoma with foci of gas consistent with laceration. CT CERVICAL SPINE FINDINGS Alignment: Reversal of the cervical lordosis. Grade 1 anterolisthesis of C3 on C4, C4 on C5, and C7 on T1. Grade 1 retrolisthesis of C5 on C6. Skull base and vertebrae: No acute fracture. Moderate C1-2 arthropathy with cystic changes in the base of the dens. Soft tissues and spinal canal: No prevertebral fluid or  swelling. No visible canal hematoma. Disc levels: Advanced disc degeneration from C3-4-C6-7 with disc space narrowing most severe at C5-6 and C6-7. Severe facet arthrosis bilaterally at C3-4 and C7-T1 and on the right at C4-5. Erosive facet changes at C3-4. Moderate multilevel neural foraminal stenosis. No osseous spinal canal stenosis. Upper chest: Clear lung apices. Other: None. IMPRESSION: 1. No evidence of acute intracranial abnormality. 2. Mild chronic small vessel ischemic disease and cerebral atrophy. 3. Left parietal scalp hematoma. 4. No acute cervical spine fracture. Advanced disc and facet degeneration. Electronically Signed   By: Seymour Bars.D.  On: 04/12/2018 19:17   Ct Cervical Spine Wo Contrast  Result Date: 04/12/2018 CLINICAL DATA:  Fall. EXAM: CT HEAD WITHOUT CONTRAST CT CERVICAL SPINE WITHOUT CONTRAST TECHNIQUE: Multidetector CT imaging of the head and cervical spine was performed following the standard protocol without intravenous contrast. Multiplanar CT image reconstructions of the cervical spine were also generated. COMPARISON:  None. FINDINGS: CT HEAD FINDINGS Brain: There is no evidence of acute infarct, intracranial hemorrhage, mass, midline shift, or extra-axial fluid collection. There is mild cerebral atrophy. Cerebral white matter hypodensities are nonspecific but compatible with mild chronic small vessel ischemic disease. Vascular: Calcified atherosclerosis at the skull base. No hyperdense vessel. Skull: No fracture or focal osseous lesion. Sinuses/Orbits: Visualized paranasal sinuses are clear. Trace right mastoid effusion. Unremarkable orbits. Other: Small left lateral parietal scalp hematoma with foci of gas consistent with laceration. CT CERVICAL SPINE FINDINGS Alignment: Reversal of the cervical lordosis. Grade 1 anterolisthesis of C3 on C4, C4 on C5, and C7 on T1. Grade 1 retrolisthesis of C5 on C6. Skull base and vertebrae: No acute fracture. Moderate C1-2 arthropathy  with cystic changes in the base of the dens. Soft tissues and spinal canal: No prevertebral fluid or swelling. No visible canal hematoma. Disc levels: Advanced disc degeneration from C3-4-C6-7 with disc space narrowing most severe at C5-6 and C6-7. Severe facet arthrosis bilaterally at C3-4 and C7-T1 and on the right at C4-5. Erosive facet changes at C3-4. Moderate multilevel neural foraminal stenosis. No osseous spinal canal stenosis. Upper chest: Clear lung apices. Other: None. IMPRESSION: 1. No evidence of acute intracranial abnormality. 2. Mild chronic small vessel ischemic disease and cerebral atrophy. 3. Left parietal scalp hematoma. 4. No acute cervical spine fracture. Advanced disc and facet degeneration. Electronically Signed   By: Logan Bores M.D.   On: 04/12/2018 19:17   Dg Chest Port 1 View  Result Date: 04/12/2018 CLINICAL DATA:  Central line placement EXAM: PORTABLE CHEST 1 VIEW COMPARISON:  None. FINDINGS: Right internal jugular central line is in place with the tip in the SVC. No pneumothorax. Bibasilar atelectasis or infiltrates. Heart is borderline in size. No effusions or acute bony abnormality. IMPRESSION: Right central line placement with the tip in the SVC. No pneumothorax. Bibasilar atelectasis or infiltrates. Electronically Signed   By: Rolm Baptise M.D.   On: 04/12/2018 21:52      Assessment & Plan: Mr. PADEN SENGER is a 68 y.o. white male with alcohol abuse, atrial fibrillation, hypertension, congestive heart failure, who was admitted to [facility] on 04/12/2018 for Hyperkalemia [E87.5] Bradycardia [R00.1] Encounter for central line placement [Z45.2] Hypothermia, initial encounter [T68.XXXA] Hypotension, unspecified hypotension type [I95.9]   1. Acute renal failure 2. Hyperkalemia 3. Chronic kidney disease stage III. Baseline creatinine of 1.33, GFR of 53 on 08/30/17 4. Metabolic acidosis 5. Hypotension 6. Macrocytic anemia 7. EtOh abuse  Impression - Continue bicarb  infusion - CIWA protocol.    LOS: 1 Elon Eoff 2/29/20209:20 AM

## 2018-04-14 LAB — CBC WITH DIFFERENTIAL/PLATELET
Abs Immature Granulocytes: 0.03 10*3/uL (ref 0.00–0.07)
Basophils Absolute: 0 10*3/uL (ref 0.0–0.1)
Basophils Relative: 1 %
Eosinophils Absolute: 0 10*3/uL (ref 0.0–0.5)
Eosinophils Relative: 1 %
HCT: 27.5 % — ABNORMAL LOW (ref 39.0–52.0)
HEMOGLOBIN: 9 g/dL — AB (ref 13.0–17.0)
Immature Granulocytes: 1 %
Lymphocytes Relative: 23 %
Lymphs Abs: 1 10*3/uL (ref 0.7–4.0)
MCH: 34.6 pg — ABNORMAL HIGH (ref 26.0–34.0)
MCHC: 32.7 g/dL (ref 30.0–36.0)
MCV: 105.8 fL — ABNORMAL HIGH (ref 80.0–100.0)
Monocytes Absolute: 0.5 10*3/uL (ref 0.1–1.0)
Monocytes Relative: 12 %
Neutro Abs: 2.7 10*3/uL (ref 1.7–7.7)
Neutrophils Relative %: 62 %
Platelets: 56 10*3/uL — ABNORMAL LOW (ref 150–400)
RBC: 2.6 MIL/uL — ABNORMAL LOW (ref 4.22–5.81)
RDW: 13.5 % (ref 11.5–15.5)
WBC: 4.2 10*3/uL (ref 4.0–10.5)
nRBC: 0 % (ref 0.0–0.2)

## 2018-04-14 LAB — BLOOD GAS, ARTERIAL
Acid-base deficit: 5 mmol/L — ABNORMAL HIGH (ref 0.0–2.0)
Bicarbonate: 16.4 mmol/L — ABNORMAL LOW (ref 20.0–28.0)
FIO2: 0.21
O2 Saturation: 97 %
Patient temperature: 37
pCO2 arterial: 22 mmHg — ABNORMAL LOW (ref 32.0–48.0)
pH, Arterial: 7.48 — ABNORMAL HIGH (ref 7.350–7.450)
pO2, Arterial: 84 mmHg (ref 83.0–108.0)

## 2018-04-14 LAB — GLUCOSE, CAPILLARY
GLUCOSE-CAPILLARY: 175 mg/dL — AB (ref 70–99)
Glucose-Capillary: 158 mg/dL — ABNORMAL HIGH (ref 70–99)
Glucose-Capillary: 159 mg/dL — ABNORMAL HIGH (ref 70–99)
Glucose-Capillary: 216 mg/dL — ABNORMAL HIGH (ref 70–99)

## 2018-04-14 LAB — HIV ANTIBODY (ROUTINE TESTING W REFLEX): HIV Screen 4th Generation wRfx: NONREACTIVE

## 2018-04-14 MED ORDER — FENTANYL CITRATE (PF) 100 MCG/2ML IJ SOLN
25.0000 ug | INTRAMUSCULAR | Status: DC | PRN
  Administered 2018-04-14 – 2018-04-15 (×2): 25 ug via INTRAVENOUS
  Filled 2018-04-14 (×2): qty 2

## 2018-04-14 MED ORDER — METOPROLOL TARTRATE 5 MG/5ML IV SOLN
2.5000 mg | Freq: Four times a day (QID) | INTRAVENOUS | Status: DC | PRN
  Administered 2018-04-14 – 2018-04-21 (×2): 5 mg via INTRAVENOUS
  Filled 2018-04-14: qty 5

## 2018-04-14 MED ORDER — PHENOBARBITAL SODIUM 65 MG/ML IJ SOLN
32.5000 mg | Freq: Two times a day (BID) | INTRAMUSCULAR | Status: DC
  Administered 2018-04-14 – 2018-04-15 (×2): 32.5 mg via INTRAVENOUS
  Filled 2018-04-14 (×2): qty 1

## 2018-04-14 MED ORDER — METOPROLOL TARTRATE 5 MG/5ML IV SOLN
2.5000 mg | Freq: Four times a day (QID) | INTRAVENOUS | Status: DC | PRN
  Administered 2018-04-14: 2.5 mg via INTRAVENOUS
  Filled 2018-04-14: qty 5

## 2018-04-14 MED ORDER — LACTATED RINGERS IV SOLN
INTRAVENOUS | Status: DC
  Administered 2018-04-14 – 2018-04-16 (×5): via INTRAVENOUS

## 2018-04-14 MED ORDER — DILTIAZEM HCL ER COATED BEADS 180 MG PO CP24
180.0000 mg | ORAL_CAPSULE | Freq: Every day | ORAL | Status: DC
  Administered 2018-04-15 – 2018-04-16 (×2): 180 mg via ORAL
  Filled 2018-04-14 (×3): qty 1

## 2018-04-14 NOTE — Clinical Social Work Note (Signed)
Clinical Social Work Assessment  Patient Details  Name: Patrick Roberts MRN: [id number] Date of Birth: [birth year]  Date of referral:  04/14/18               Reason for consult:  Substance Use/ETOH Abuse, Community Resources                Permission sought to share information with:  Family Supports Permission granted to share information::  Yes, Verbal Permission Granted  Name::     Patrick Roberts [phone]  Agency::     Relationship::     Contact Information:     Housing/Transportation Living arrangements for the past 2 months:  Single Family Home Source of Information:  Patient Patient Interpreter Needed:  None Criminal Activity/Legal Involvement Pertinent to Current Situation/Hospitalization:  No - Comment as needed Significant Relationships:  Adult Children, Spouse, Other Family Members Lives with:  Spouse Do you feel safe going back to the place where you live?  Yes Need for family participation in patient care:  Yes (Comment)  Care giving concerns: TBD- Patrick not present   Facilities manager / plan: LCSW introduced myself to patient- several attempts were made to awaken patient. Once patient was alert he agreed to answer questions to complete assessment. Patient has been married for over 1 years and has 2 adult children 36, and 26 years old. 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 Patient was able to answer simple questions and then was nodding in and out during our assessments. In discussion patient has issues with alcohol and he self reported that he drinks vodka- 1/2 pint to 1 pint daily. The longest period sober was 6 months and he did this himself. He denies ever being to a treatment facility and would look into getting some help now. LCSW provided patient with handouts for [employer] and AA and [employer] were left with patient. Patient resumed sleep.  Employment status:  Retired, Systems developer information:  Managed  Care([employer]) PT Recommendations:    Information / Referral to community resources:  Outpatient Substance Abuse Treatment Options, Residential Substance Abuse Treatment Options  Patient/Family's Response to care: TBD  Patient/Family's Understanding of and Emotional Response to Diagnosis, Current Treatment, and Prognosis:  Understands he fell while under the influence of alcohol  Emotional Assessment Appearance:  Appears stated age Attitude/Demeanor/Rapport:  Lethargic, Sedated Affect (typically observed):  Withdrawn, Calm Orientation:  Oriented to Self, Oriented to Place, Oriented to  Time, Oriented to Situation Alcohol / Substance use:  Alcohol Use Psych involvement (Current and /or in the community):  No (Comment)  Discharge Needs  Concerns to be addressed:  No discharge needs identified Readmission within the last 30 days:  No Current discharge risk:  Substance Abuse Barriers to Discharge:  Continued Medical Work up   [place], [place], LCSW 04/14/2018, 3:09 PM

## 2018-04-14 NOTE — Progress Notes (Signed)
[facility]  ROUNDING NOTE   Subjective:   Left knee patellar fracture found.   Creatinine 2.18 (2.54)  CO2 16 (14)  UOP 1700  LR at 16mL/hr  Objective:  Vital signs in last 24 hours:  Temp:  [97.6 F (36.4 C)-98.6 F (37 C)] 97.7 F (36.5 C) (03/01 0751) Pulse Rate:  [52-78] 52 (03/01 0900) Resp:  [6-28] 16 (03/01 0900) BP: (82-126)/(61-93) 102/74 (03/01 0900) SpO2:  [95 %-100 %] 96 % (03/01 0900)  Weight change:  Filed Weights   04/12/18 1846 04/13/18 0030  Weight: 83.9 kg 84.1 kg    Intake/Output: I/O last 3 completed shifts: In: 4791.4 [P.O.:240; I.V.:4359.3; IV Piggyback:192.1] Out: 2350 [Urine:2350]   Intake/Output this shift:  Total I/O In: 480 [P.O.:480] Out: 275 [Urine:275]  Physical Exam: General: NAD,   Head: +lacerations  Eyes: Anicteric, PERRL  Neck: Supple, trachea midline  Lungs:  Clear to auscultation  Heart: Regular rate and rhythm  Abdomen:  Soft, nontender,   Extremities: no peripheral edema.  Neurologic: +tremor  Skin: No lesions        Basic Metabolic Panel: Recent Labs  Lab 04/12/18 2000 04/13/18 0402 04/13/18 1309  NA 133* 137 136  K 6.0* 5.1 5.1  CL 104 103 98  CO2 11* 14* 16*  GLUCOSE 59* 86 96  BUN 40* 36* 33*  CREATININE 3.07* 2.54* 2.18*  CALCIUM 7.0* 7.1* 7.4*  MG  --  2.1  --   PHOS  --  3.6  --     Liver Function Tests: Recent Labs  Lab 04/12/18 2000 04/13/18 1309  AST 64*  --   ALT 37  --   ALKPHOS 53  --   BILITOT 1.2  --   PROT 5.5*  --   ALBUMIN 3.2* 3.3*   No results for input(s): LIPASE, AMYLASE in the last 168 hours. No results for input(s): AMMONIA in the last 168 hours.  CBC: Recent Labs  Lab 04/12/18 1848 04/12/18 2244 04/13/18 0402  WBC 6.3  --  6.5  HGB 12.4* 11.9* 10.8*  HCT 38.0*  --  32.4*  MCV 105.6*  --  104.9*  PLT 102*  --  92*    Cardiac Enzymes: Recent Labs  Lab 04/12/18 2000  CKTOTAL 129  TROPONINI <0.03    BNP: Invalid input(s):  POCBNP  CBG: Recent Labs  Lab 04/13/18 1528 04/13/18 1919 04/13/18 2052 04/14/18 0401 04/14/18 0739  GLUCAP 99 145* 142* 159* 175*    Microbiology: Results for orders placed or performed during the hospital encounter of 04/12/18  CULTURE, BLOOD (ROUTINE X 2) w Reflex to ID Panel     Status: None (Preliminary result)   Collection Time: 04/12/18 10:44 PM  Result Value Ref Range Status   Specimen Description BLOOD RIGHT WRIST  Final   Special Requests   Final    BOTTLES DRAWN AEROBIC AND ANAEROBIC Blood Culture adequate volume   Culture   Final    NO GROWTH 2 DAYS Performed at [facility], [address] [place], [place] [address]    Report Status PENDING  Incomplete  CULTURE, BLOOD (ROUTINE X 2) w Reflex to ID Panel     Status: None (Preliminary result)   Collection Time: 04/12/18 10:44 PM  Result Value Ref Range Status   Specimen Description BLOOD LEFT ANTECUBITAL  Final   Special Requests   Final    BOTTLES DRAWN AEROBIC AND ANAEROBIC Blood Culture adequate volume   Culture   Final  NO GROWTH 2 DAYS Performed at [facility], [address] [place], [place] [address]    Report Status PENDING  Incomplete  MRSA PCR Screening     Status: None   Collection Time: 04/12/18 11:38 PM  Result Value Ref Range Status   MRSA by PCR NEGATIVE NEGATIVE Final    Comment:        The GeneXpert MRSA Assay (FDA approved for NASAL specimens only), is one component of a comprehensive MRSA colonization surveillance program. It is not intended to diagnose MRSA infection nor to guide or monitor treatment for MRSA infections. Performed at [facility], [address] [place], [place] [address]     Coagulation Studies: No results for input(s): LABPROT, INR in the last 72 hours.  Urinalysis: No results for input(s): COLORURINE, LABSPEC, PHURINE, GLUCOSEU, HGBUR, BILIRUBINUR, KETONESUR, PROTEINUR, UROBILINOGEN, NITRITE, LEUKOCYTESUR in the last 72  hours.  Invalid input(s): APPERANCEUR    Imaging: Ct Head Wo Contrast  Result Date: 04/12/2018 CLINICAL DATA:  Fall. EXAM: CT HEAD WITHOUT CONTRAST CT CERVICAL SPINE WITHOUT CONTRAST TECHNIQUE: Multidetector CT imaging of the head and cervical spine was performed following the standard protocol without intravenous contrast. Multiplanar CT image reconstructions of the cervical spine were also generated. COMPARISON:  None. FINDINGS: CT HEAD FINDINGS Brain: There is no evidence of acute infarct, intracranial hemorrhage, mass, midline shift, or extra-axial fluid collection. There is mild cerebral atrophy. Cerebral white matter hypodensities are nonspecific but compatible with mild chronic small vessel ischemic disease. Vascular: Calcified atherosclerosis at the skull base. No hyperdense vessel. Skull: No fracture or focal osseous lesion. Sinuses/Orbits: Visualized paranasal sinuses are clear. Trace right mastoid effusion. Unremarkable orbits. Other: Small left lateral parietal scalp hematoma with foci of gas consistent with laceration. CT CERVICAL SPINE FINDINGS Alignment: Reversal of the cervical lordosis. Grade 1 anterolisthesis of C3 on C4, C4 on C5, and C7 on T1. Grade 1 retrolisthesis of C5 on C6. Skull base and vertebrae: No acute fracture. Moderate C1-2 arthropathy with cystic changes in the base of the dens. Soft tissues and spinal canal: No prevertebral fluid or swelling. No visible canal hematoma. Disc levels: Advanced disc degeneration from C3-4-C6-7 with disc space narrowing most severe at C5-6 and C6-7. Severe facet arthrosis bilaterally at C3-4 and C7-T1 and on the right at C4-5. Erosive facet changes at C3-4. Moderate multilevel neural foraminal stenosis. No osseous spinal canal stenosis. Upper chest: Clear lung apices. Other: None. IMPRESSION: 1. No evidence of acute intracranial abnormality. 2. Mild chronic small vessel ischemic disease and cerebral atrophy. 3. Left parietal scalp hematoma. 4.  No acute cervical spine fracture. Advanced disc and facet degeneration. Electronically Signed   By: Logan Bores M.D.   On: 04/12/2018 19:17   Ct Cervical Spine Wo Contrast  Result Date: 04/12/2018 CLINICAL DATA:  Fall. EXAM: CT HEAD WITHOUT CONTRAST CT CERVICAL SPINE WITHOUT CONTRAST TECHNIQUE: Multidetector CT imaging of the head and cervical spine was performed following the standard protocol without intravenous contrast. Multiplanar CT image reconstructions of the cervical spine were also generated. COMPARISON:  None. FINDINGS: CT HEAD FINDINGS Brain: There is no evidence of acute infarct, intracranial hemorrhage, mass, midline shift, or extra-axial fluid collection. There is mild cerebral atrophy. Cerebral white matter hypodensities are nonspecific but compatible with mild chronic small vessel ischemic disease. Vascular: Calcified atherosclerosis at the skull base. No hyperdense vessel. Skull: No fracture or focal osseous lesion. Sinuses/Orbits: Visualized paranasal sinuses are clear. Trace right mastoid effusion. Unremarkable orbits. Other: Small left lateral  parietal scalp hematoma with foci of gas consistent with laceration. CT CERVICAL SPINE FINDINGS Alignment: Reversal of the cervical lordosis. Grade 1 anterolisthesis of C3 on C4, C4 on C5, and C7 on T1. Grade 1 retrolisthesis of C5 on C6. Skull base and vertebrae: No acute fracture. Moderate C1-2 arthropathy with cystic changes in the base of the dens. Soft tissues and spinal canal: No prevertebral fluid or swelling. No visible canal hematoma. Disc levels: Advanced disc degeneration from C3-4-C6-7 with disc space narrowing most severe at C5-6 and C6-7. Severe facet arthrosis bilaterally at C3-4 and C7-T1 and on the right at C4-5. Erosive facet changes at C3-4. Moderate multilevel neural foraminal stenosis. No osseous spinal canal stenosis. Upper chest: Clear lung apices. Other: None. IMPRESSION: 1. No evidence of acute intracranial abnormality. 2.  Mild chronic small vessel ischemic disease and cerebral atrophy. 3. Left parietal scalp hematoma. 4. No acute cervical spine fracture. Advanced disc and facet degeneration. Electronically Signed   By: Logan Bores M.D.   On: 04/12/2018 19:17   Dg Chest Port 1 View  Result Date: 04/12/2018 CLINICAL DATA:  Central line placement EXAM: PORTABLE CHEST 1 VIEW COMPARISON:  None. FINDINGS: Right internal jugular central line is in place with the tip in the SVC. No pneumothorax. Bibasilar atelectasis or infiltrates. Heart is borderline in size. No effusions or acute bony abnormality. IMPRESSION: Right central line placement with the tip in the SVC. No pneumothorax. Bibasilar atelectasis or infiltrates. Electronically Signed   By: Rolm Baptise M.D.   On: 04/12/2018 21:52   Dg Knee Left Port  Result Date: 04/13/2018 CLINICAL DATA:  Swelling and bruising anterior to the patella after a fall. EXAM: PORTABLE LEFT KNEE - 1-2 VIEW COMPARISON:  None. FINDINGS: Moderate to severe tricompartment degenerative changes in the left knee with triple compartment narrowing and osteophyte formation. Cartilaginous calcifications. There is a linear fracture through the midportion of the patella in the sagittal dimension. If clinically indicated, this could be better evaluated with sunrise patellar view. No additional fractures or subluxation identified. There is a large left knee effusion with calcifications demonstrated in the suprapatellar region and posterolateral recess. This may represent loose bodies, synovial osteochondromatosis, or dystrophic calcification related to old hemorrhage or infection. IMPRESSION: 1. Nondisplaced sagittal fracture through the midportion of the patella. 2. Large left knee effusion with calcifications in the suprapatellar region and posterolateral recess. 3. Moderate to severe tricompartment degenerative changes. Electronically Signed   By: Lucienne Capers M.D.   On: 04/13/2018 20:49      Medications:   . dexmedetomidine (PRECEDEX) IV infusion 0.6 mcg/kg/hr (04/14/18 0931)  . lactated ringers 125 mL/hr at 04/14/18 0753  . norepinephrine (LEVOPHED) Adult infusion Stopped (04/13/18 1131)   . folic acid  1 mg Oral Daily  . LORazepam  0-4 mg Intravenous Q6H   Or  . LORazepam  0-4 mg Oral Q6H  . [START ON 04/15/2018] LORazepam  0-4 mg Intravenous Q12H   Or  . [START ON 04/15/2018] LORazepam  0-4 mg Oral Q12H  . multivitamin with minerals  1 tablet Oral Daily  . pantoprazole  40 mg Oral Q1200  . PHENObarbital  32.5 mg Intravenous BID  . thiamine  100 mg Oral Daily   Or  . thiamine  100 mg Intravenous Daily   acetaminophen **OR** acetaminophen, diazepam, fentaNYL (SUBLIMAZE) injection, LORazepam **OR** LORazepam, ondansetron **OR** ondansetron (ZOFRAN) IV  Assessment/ Plan:  Mr. Patrick Roberts is a 68 y.o. white male with alcohol abuse, atrial fibrillation, hypertension,  congestive heart failure, who was admitted to [facility] on 04/12/2018 for acute alcohol withdrawal.   1. Acute renal failure 2. Hyperkalemia 3. Chronic kidney disease stage III. Baseline creatinine of 1.33, GFR of 53 on 08/30/17 4. Metabolic acidosis 5. Hypotension 6. Macrocytic anemia 7. EtOh abuse  Impression - Continue LR infusion - CIWA protocol.    LOS: 2 Jaysen Wey 3/1/20209:51 AM

## 2018-04-14 NOTE — Consult Note (Signed)
PULMONARY / CRITICAL CARE MEDICINE  Name: BREYER TEJERA MRN: [id number] DOB: [birth year]    LOS: 2  Referring Provider: Dr. Posey Pronto Reason for Referral: Fall with head injury and hypotension  Cc FOLLOW UP ENCEPHALOPATHY  HPI:  REMAINS ENCEPHALOPATHIC CONFUSED LEFT KNEE PAIN DX WITH NONDISPLACED FRACTURE PAIN MEDS AS NEEDED  REMAINS CRITICALLY ILL   REVIEW OF SYSTEMS  PATIENT IS UNABLE TO PROVIDE COMPLETE REVIEW OF SYSTEM S DUE TO SEVERE  ENCEPHALOPATHY   VITAL SIGNS: BP 103/67   Pulse (!) 58   Temp 97.8 F (36.6 C) (Axillary)   Resp (!) 9   Ht 6\' 1"  (1.854 m)   Wt 84.1 kg   SpO2 95%   BMI 24.46 kg/m   HEMODYNAMICS:    VENTILATOR SETTINGS:    INTAKE / OUTPUT: I/O last 3 completed shifts: In: 4068.5 [P.O.:240; I.V.:3636.4; IV Piggyback:192.1] Out: 2350 [Urine:2350]  PHYSICAL EXAMINATION: General: Acutely ill looking, disheveled HEENT: PERRLA, left scalp laceration with staples in place, mild bleeding noted Neuro: LETHARGIC, AROUSABLE Cardiovascular: Apical pulse mildly bradycardic, S1-S2, no murmur regurg or gallop, +2 pulses bilaterally, no edema Lungs: Bilateral breath sounds without any wheezes or rhonchi Abdomen: None distended, normal bowel sounds in all 4 quadrants, palpation reveals no organomegaly Musculoskeletal: LEFT KNEE PAIN  LABS:  BMET Recent Labs  Lab 04/12/18 2000 04/13/18 0402 04/13/18 1309  NA 133* 137 136  K 6.0* 5.1 5.1  CL 104 103 98  CO2 11* 14* 16*  BUN 40* 36* 33*  CREATININE 3.07* 2.54* 2.18*  GLUCOSE 59* 86 96    Electrolytes Recent Labs  Lab 04/12/18 2000 04/13/18 0402 04/13/18 1309  CALCIUM 7.0* 7.1* 7.4*  MG  --  2.1  --   PHOS  --  3.6  --     CBC Recent Labs  Lab 04/12/18 1848 04/12/18 2244 04/13/18 0402  WBC 6.3  --  6.5  HGB 12.4* 11.9* 10.8*  HCT 38.0*  --  32.4*  PLT 102*  --  92*    Coag's No results for input(s): APTT, INR in the last 168 hours.  Sepsis Markers Recent Labs  Lab  04/12/18 2057 04/12/18 2244  LATICACIDVEN 3.6* 3.0*    ABG Recent Labs  Lab 04/13/18 1430 04/14/18 0500  PHART 7.49* 7.48*  PCO2ART 22* 22*  PO2ART 66* 84    Liver Enzymes Recent Labs  Lab 04/12/18 2000 04/13/18 1309  AST 64*  --   ALT 37  --   ALKPHOS 53  --   BILITOT 1.2  --   ALBUMIN 3.2* 3.3*    Cardiac Enzymes Recent Labs  Lab 04/12/18 2000  TROPONINI <0.03    Glucose Recent Labs  Lab 04/13/18 1006 04/13/18 1329 04/13/18 1528 04/13/18 1919 04/13/18 2052 04/14/18 0401  GLUCAP 113* 92 99 145* 142* 159*    Imaging Dg Knee Left Port  Result Date: 04/13/2018 CLINICAL DATA:  Swelling and bruising anterior to the patella after a fall. EXAM: PORTABLE LEFT KNEE - 1-2 VIEW COMPARISON:  None. FINDINGS: Moderate to severe tricompartment degenerative changes in the left knee with triple compartment narrowing and osteophyte formation. Cartilaginous calcifications. There is a linear fracture through the midportion of the patella in the sagittal dimension. If clinically indicated, this could be better evaluated with sunrise patellar view. No additional fractures or subluxation identified. There is a large left knee effusion with calcifications demonstrated in the suprapatellar region and posterolateral recess. This may represent loose bodies, synovial osteochondromatosis, or dystrophic calcification related to  old hemorrhage or infection. IMPRESSION: 1. Nondisplaced sagittal fracture through the midportion of the patella. 2. Large left knee effusion with calcifications in the suprapatellar region and posterolateral recess. 3. Moderate to severe tricompartment degenerative changes. Electronically Signed   By: Lucienne Capers M.D.   On: 04/13/2018 20:49    SIGNIFICANT EVENTS: 04/12/2018: Admitted  LINES/TUBES: Peripheral IVs   ACUTE ENCEPHALOPATHY FROM ACUTE ETOH ABUSE WITH DT'S -PHENOBARB -FENTANYL FOR PAIN -ATIVAN AS NEEDED-CIWA  LEFT KNEE-FRACTURE ORTHO CONSULT  PENDING  ELECTROLYTES -follow labs as needed -replace as needed -pharmacy consultation and following   DVT/GI PRX ordered TRANSFUSIONS AS NEEDED MONITOR FSBS ASSESS the need for LABS as needed    Critical Care Time devoted to patient care services described in this note is 31  minutes.   Overall, patient is critically ill, prognosis is guarded.     Corrin Parker, M.D.  [facility]  Medical Director [place] Director [facility] Cardio-Pulmonary Department

## 2018-04-14 NOTE — Progress Notes (Signed)
[facility] [place] NAME: Patrick Roberts    MR#:  [id number]  DATE OF BIRTH:  18-Oct-1950  SUBJECTIVE:   Patient states that he is feeling okay this morning.  No acute events overnight.  Denies any chest pain, palpitations, headaches.  REVIEW OF SYSTEMS:  Review of Systems  Constitutional: Negative for chills and fever.  HENT: Negative for congestion and sore throat.   Eyes: Negative for blurred vision and double vision.  Respiratory: Negative for cough and shortness of breath.   Cardiovascular: Negative for chest pain and palpitations.  Gastrointestinal: Negative for nausea and vomiting.  Genitourinary: Negative for dysuria and urgency.  Musculoskeletal: Positive for falls. Negative for back pain and neck pain.  Neurological: Negative for dizziness and headaches.  Psychiatric/Behavioral: Negative for depression. The patient is not nervous/anxious.     DRUG ALLERGIES:  No Known Allergies VITALS:  Blood pressure 108/83, pulse (!) 129, temperature 97.6 F (36.4 C), temperature source Oral, resp. rate (!) 23, height 6\' 1"  (1.854 m), weight 84.1 kg, SpO2 100 %. PHYSICAL EXAMINATION:  Physical Exam  GENERAL:  68 y.o.-year-old patient lying in the bed with no acute distress.  EYES: Pupils equal, round, reactive to light and accommodation. No scleral icterus. Extraocular muscles intact.  HEENT: Head atraumatic, normocephalic. Oropharynx and nasopharynx clear.  NECK:  Supple, no jugular venous distention. No thyroid enlargement, no tenderness.  LUNGS: Normal breath sounds bilaterally, no wheezing, rales,rhonchi or crepitation. No use of accessory muscles of respiration.  CARDIOVASCULAR: S1, S2 normal. No murmurs, rubs, or gallops.  ABDOMEN: Soft, nontender, nondistended. Bowel sounds present. No organomegaly or mass.  EXTREMITIES: No pedal edema, cyanosis, or clubbing. +abrasions and ecchymoses on the lower extremities bilaterally NEUROLOGIC:  Cranial nerves II through XII are intact. Muscle strength 5/5 in all extremities. Sensation intact. Gait not checked. + Tremulous PSYCHIATRIC: The patient is alert and oriented x 3.  SKIN: + Laceration to posterior scalp, +abrasions to all extremities  LABORATORY PANEL:  Male CBC Recent Labs  Lab 04/14/18 1615  WBC 4.2  HGB 9.0*  HCT 27.5*  PLT 56*   ------------------------------------------------------------------------------------------------------------------ Chemistries  Recent Labs  Lab 04/12/18 2000 04/13/18 0402 04/13/18 1309  NA 133* 137 136  K 6.0* 5.1 5.1  CL 104 103 98  CO2 11* 14* 16*  GLUCOSE 59* 86 96  BUN 40* 36* 33*  CREATININE 3.07* 2.54* 2.18*  CALCIUM 7.0* 7.1* 7.4*  MG  --  2.1  --   AST 64*  --   --   ALT 37  --   --   ALKPHOS 53  --   --   BILITOT 1.2  --   --    RADIOLOGY:  No results found. ASSESSMENT AND PLAN:   Acute encephalopathy secondary to alcohol abuse -On precedex and phenobarbitol -CIWA protocol -Thiamine, folate, MVI -SW consult for outpatient resources for alcohol rehabilitation  Severe hypotension- etiology unclear, although may be secondary to hypovolemia.  Patient did have hypothermia on admission, but sepsis felt to be less likely. -Blood cultures with no growth to date -Continue pressors -Continue IV fluids -Antibiotics have been discontinued  Left patellar fracture -Ortho consulted -Knee immobilizer in place  Paroxysmal atrial fibrillation- mostly rate controlled -Hold Xarelto for now -Holding home diltiazem, Tikosyn, metoprolol in the setting of hypotension  Acute renal failure-due to hypotension.  Creatinine improving with fluids. -Nephrology consulted- continue IVFs  Macrocytic anemia- likely due to alcoholism  DVT prophylaxis- SCDs in  light of bleeding head laceration.  All the records are reviewed and case discussed with Care Management/Social Worker. Management plans discussed with the patient,  family and they are in agreement.  CODE STATUS: Full Code  TOTAL TIME TAKING CARE OF THIS PATIENT: 35 minutes.   More than 50% of the time was spent in counseling/coordination of care: YES  POSSIBLE D/C IN 2-3 DAYS, DEPENDING ON CLINICAL CONDITION.   Berna Spare Dayona Shaheen M.D on 04/14/2018 at 8:21 PM  Between 7am to 6pm - Pager [phone]  After 6pm go to [email or web address] - Proofreader  [facility]  Office  [phone]  CC: Primary care physician; Minna Merritts, MD  Note: This dictation was prepared with Dragon dictation along with smaller phrase technology. Any transcriptional errors that result from this process are unintentional.

## 2018-04-14 NOTE — Consult Note (Signed)
ORTHOPAEDIC CONSULTATION  REQUESTING PHYSICIAN: Mayo, Pete Pelt, MD  Chief Complaint: Left patella fracture  HPI: Patrick Roberts is a 68 y.o. male with encephalopathy in the ICU who has left knee pain after a fall prior to admission.  Patient has a history of alcohol abuse, essential hypertension and atrial fibrillation who sustained a fall causing a laceration to his head.  Patient is also been found to have a left patella fracture on x-ray of his knee.  Orthopedics is consulted for recommendations regarding his left patella fracture.  Patient is unable to provide an accurate history given his encephalopathy.  Patient's wife is at the bedside and history is obtained from her.  Past Medical History:  Diagnosis Date  . Alcohol abuse   . Chronic systolic (congestive) heart failure ([place])    a. TTE 8/17: EF 20-25%, mild conc LVH, mild MR, mild biatrial enlarge, mild-mod TR, PASP 45, aortic root 39 mm, small posterior pericardial effusion, Afib with RVR w/ HR 120-150 bpm; b. TTE 6/18: EF 60-65%, mod LVH, mild MR, mild biatrial enlarge, mildly dilated RV, midlly reduced RVSF, mild-mod TR  . Essential hypertension   . Persistent atrial fibrillation    a. on Xarelto; b. CHADS2VASc 3 (CHF, HTN, age x 1)  . Skin cancer of face    "under left eye"   Past Surgical History:  Procedure Laterality Date  . KNEE ARTHROSCOPY Bilateral X 3   "twice on the left"  . MOHS SURGERY     "under left eye"  . TONSILLECTOMY     Social History   Socioeconomic History  . Marital status: Married    Spouse name: Not on file  . Number of children: Not on file  . Years of education: Not on file  . Highest education level: Not on file  Occupational History  . Not on file  Social Needs  . Financial resource strain: Not on file  . Food insecurity:    Worry: Not on file    Inability: Not on file  . Transportation needs:    Medical: Not on file    Non-medical: Not on file  Tobacco Use  . Smoking status:  Never Smoker  . Smokeless tobacco: Never Used  Substance and Sexual Activity  . Alcohol use: Yes    Alcohol/week: 2.0 standard drinks    Types: 2 Glasses of wine per week  . Drug use: No  . Sexual activity: Not on file  Lifestyle  . Physical activity:    Days per week: Not on file    Minutes per session: Not on file  . Stress: Not on file  Relationships  . Social connections:    Talks on phone: Not on file    Gets together: Not on file    Attends religious service: Not on file    Active member of club or organization: Not on file    Attends meetings of clubs or organizations: Not on file    Relationship status: Not on file  Other Topics Concern  . Not on file  Social History Narrative  . Not on file   Family History  Problem Relation Age of Onset  . Dementia Mother   . Heart disease Father   . Heart attack Father    No Known Allergies Prior to Admission medications   Medication Sig Start Date End Date Taking? Authorizing Provider  diltiazem (CARDIZEM CD) 180 MG 24 hr capsule Take 1 capsule (180 mg total) by mouth daily. Patient taking  differently: Take 180 mg by mouth 2 (two) times daily.  08/30/17  Yes Baldwin Jamaica, PA-C  dofetilide (TIKOSYN) 250 MCG capsule TAKE 1 CAPSULE (250 MCG) BY MOUTH TWICE DAILY - APPT. REQUIRED FOR REFILLS 04/08/18  Yes Sherran Needs, NP  magnesium oxide (MAG-OX) 400 (241.3 Mg) MG tablet Take 1 tablet (400 mg total) by mouth 2 (two) times daily. 08/30/17  Yes Baldwin Jamaica, PA-C  metoprolol succinate (TOPROL-XL) 100 MG 24 hr tablet Take 1 tablet (100 mg total) by mouth daily. 08/30/17  Yes Baldwin Jamaica, PA-C  sacubitril-valsartan (ENTRESTO) 24-26 MG Take 1 tablet by mouth 2 (two) times daily. 07/10/17  Yes Dunn, Ryan M, PA-C  XARELTO 20 MG TABS tablet TAKE ONE TABLET EVERY DAY WITH SUPPER 04/12/18  Yes Minna Merritts, MD   Ct Head Wo Contrast  Result Date: 04/12/2018 CLINICAL DATA:  Fall. EXAM: CT HEAD WITHOUT CONTRAST CT CERVICAL  SPINE WITHOUT CONTRAST TECHNIQUE: Multidetector CT imaging of the head and cervical spine was performed following the standard protocol without intravenous contrast. Multiplanar CT image reconstructions of the cervical spine were also generated. COMPARISON:  None. FINDINGS: CT HEAD FINDINGS Brain: There is no evidence of acute infarct, intracranial hemorrhage, mass, midline shift, or extra-axial fluid collection. There is mild cerebral atrophy. Cerebral white matter hypodensities are nonspecific but compatible with mild chronic small vessel ischemic disease. Vascular: Calcified atherosclerosis at the skull base. No hyperdense vessel. Skull: No fracture or focal osseous lesion. Sinuses/Orbits: Visualized paranasal sinuses are clear. Trace right mastoid effusion. Unremarkable orbits. Other: Small left lateral parietal scalp hematoma with foci of gas consistent with laceration. CT CERVICAL SPINE FINDINGS Alignment: Reversal of the cervical lordosis. Grade 1 anterolisthesis of C3 on C4, C4 on C5, and C7 on T1. Grade 1 retrolisthesis of C5 on C6. Skull base and vertebrae: No acute fracture. Moderate C1-2 arthropathy with cystic changes in the base of the dens. Soft tissues and spinal canal: No prevertebral fluid or swelling. No visible canal hematoma. Disc levels: Advanced disc degeneration from C3-4-C6-7 with disc space narrowing most severe at C5-6 and C6-7. Severe facet arthrosis bilaterally at C3-4 and C7-T1 and on the right at C4-5. Erosive facet changes at C3-4. Moderate multilevel neural foraminal stenosis. No osseous spinal canal stenosis. Upper chest: Clear lung apices. Other: None. IMPRESSION: 1. No evidence of acute intracranial abnormality. 2. Mild chronic small vessel ischemic disease and cerebral atrophy. 3. Left parietal scalp hematoma. 4. No acute cervical spine fracture. Advanced disc and facet degeneration. Electronically Signed   By: Logan Bores M.D.   On: 04/12/2018 19:17   Ct Cervical Spine Wo  Contrast  Result Date: 04/12/2018 CLINICAL DATA:  Fall. EXAM: CT HEAD WITHOUT CONTRAST CT CERVICAL SPINE WITHOUT CONTRAST TECHNIQUE: Multidetector CT imaging of the head and cervical spine was performed following the standard protocol without intravenous contrast. Multiplanar CT image reconstructions of the cervical spine were also generated. COMPARISON:  None. FINDINGS: CT HEAD FINDINGS Brain: There is no evidence of acute infarct, intracranial hemorrhage, mass, midline shift, or extra-axial fluid collection. There is mild cerebral atrophy. Cerebral white matter hypodensities are nonspecific but compatible with mild chronic small vessel ischemic disease. Vascular: Calcified atherosclerosis at the skull base. No hyperdense vessel. Skull: No fracture or focal osseous lesion. Sinuses/Orbits: Visualized paranasal sinuses are clear. Trace right mastoid effusion. Unremarkable orbits. Other: Small left lateral parietal scalp hematoma with foci of gas consistent with laceration. CT CERVICAL SPINE FINDINGS Alignment: Reversal of the cervical lordosis. Grade  1 anterolisthesis of C3 on C4, C4 on C5, and C7 on T1. Grade 1 retrolisthesis of C5 on C6. Skull base and vertebrae: No acute fracture. Moderate C1-2 arthropathy with cystic changes in the base of the dens. Soft tissues and spinal canal: No prevertebral fluid or swelling. No visible canal hematoma. Disc levels: Advanced disc degeneration from C3-4-C6-7 with disc space narrowing most severe at C5-6 and C6-7. Severe facet arthrosis bilaterally at C3-4 and C7-T1 and on the right at C4-5. Erosive facet changes at C3-4. Moderate multilevel neural foraminal stenosis. No osseous spinal canal stenosis. Upper chest: Clear lung apices. Other: None. IMPRESSION: 1. No evidence of acute intracranial abnormality. 2. Mild chronic small vessel ischemic disease and cerebral atrophy. 3. Left parietal scalp hematoma. 4. No acute cervical spine fracture. Advanced disc and facet  degeneration. Electronically Signed   By: Logan Bores M.D.   On: 04/12/2018 19:17   Dg Chest Port 1 View  Result Date: 04/12/2018 CLINICAL DATA:  Central line placement EXAM: PORTABLE CHEST 1 VIEW COMPARISON:  None. FINDINGS: Right internal jugular central line is in place with the tip in the SVC. No pneumothorax. Bibasilar atelectasis or infiltrates. Heart is borderline in size. No effusions or acute bony abnormality. IMPRESSION: Right central line placement with the tip in the SVC. No pneumothorax. Bibasilar atelectasis or infiltrates. Electronically Signed   By: Rolm Baptise M.D.   On: 04/12/2018 21:52   Dg Knee Left Port  Result Date: 04/13/2018 CLINICAL DATA:  Swelling and bruising anterior to the patella after a fall. EXAM: PORTABLE LEFT KNEE - 1-2 VIEW COMPARISON:  None. FINDINGS: Moderate to severe tricompartment degenerative changes in the left knee with triple compartment narrowing and osteophyte formation. Cartilaginous calcifications. There is a linear fracture through the midportion of the patella in the sagittal dimension. If clinically indicated, this could be better evaluated with sunrise patellar view. No additional fractures or subluxation identified. There is a large left knee effusion with calcifications demonstrated in the suprapatellar region and posterolateral recess. This may represent loose bodies, synovial osteochondromatosis, or dystrophic calcification related to old hemorrhage or infection. IMPRESSION: 1. Nondisplaced sagittal fracture through the midportion of the patella. 2. Large left knee effusion with calcifications in the suprapatellar region and posterolateral recess. 3. Moderate to severe tricompartment degenerative changes. Electronically Signed   By: Lucienne Capers M.D.   On: 04/13/2018 20:49    Positive ROS: All other systems have been reviewed and were otherwise negative with the exception of those mentioned in the HPI and as above.  Physical Exam: General:  Alert, no acute distress  MUSCULOSKELETAL: Bilateral lower extremities: Patient has superficial abrasions/ecchymosis over both patella.  Patient has swelling in both knees.  Patient has tenderness to palpation over both anterior knees.  He has palpable pedal pulses.  Motor and sensory function are difficult to accurately assess based on the patient's encephalopathy.  There is no palpable defect of the left patella.  Assessment: Vertical fracture of the left patella, closed  Plan: I explained to the patient and his wife that he has a fracture to the left patella.  There is no displacement to this fracture by x-ray.  The fracture is a vertical pattern and is less likely to displace then transverse fractures of the patella.    Recommendations:   #1)  LEFT KNEE IMMOBILIZER to prevent knee flexion which may cause fracture displacement.  I recommend the patient wear this at all times.  Duration of immobilization 4-6 weeks.  I have  placed order for the brace in Epic.  #2)  Once the patient is able to get out of bed, he MAY place weight on the left lower extremity (WBAT) while the knee immobilizer is on his left lower extremity.    #3)  ROLLING WALKER for assistance with ambulation while in the knee immobilizer  #4) PT evaluation for gait training with immobilizer once patient is able to stand and ambulate from a medical standpoint.    #5)  Follow up with orthopaedics in 2 weeks for re-evaluation and xray of left patella fracture.    Thornton Park, MD    04/14/2018 2:36 PM

## 2018-04-15 LAB — GLUCOSE, CAPILLARY
Glucose-Capillary: 124 mg/dL — ABNORMAL HIGH (ref 70–99)
Glucose-Capillary: 131 mg/dL — ABNORMAL HIGH (ref 70–99)

## 2018-04-15 LAB — BLOOD GAS, ARTERIAL
ACID-BASE EXCESS: 4.4 mmol/L — AB (ref 0.0–2.0)
Bicarbonate: 26.2 mmol/L (ref 20.0–28.0)
FIO2: 0.21
O2 SAT: 94.8 %
Patient temperature: 37
pCO2 arterial: 30 mmHg — ABNORMAL LOW (ref 32.0–48.0)
pH, Arterial: 7.55 — ABNORMAL HIGH (ref 7.350–7.450)
pO2, Arterial: 64 mmHg — ABNORMAL LOW (ref 83.0–108.0)

## 2018-04-15 LAB — COMPREHENSIVE METABOLIC PANEL
ALK PHOS: 51 U/L (ref 38–126)
ALT: 22 U/L (ref 0–44)
ANION GAP: 10 (ref 5–15)
AST: 25 U/L (ref 15–41)
Albumin: 2.8 g/dL — ABNORMAL LOW (ref 3.5–5.0)
BUN: 21 mg/dL (ref 8–23)
CO2: 26 mmol/L (ref 22–32)
Calcium: 7.9 mg/dL — ABNORMAL LOW (ref 8.9–10.3)
Chloride: 98 mmol/L (ref 98–111)
Creatinine, Ser: 1.34 mg/dL — ABNORMAL HIGH (ref 0.61–1.24)
GFR calc Af Amer: >60 mL/min (ref >60)
GFR calc non Af Amer: 54 mL/min — ABNORMAL LOW (ref >60)
Glucose, Bld: 136 mg/dL — ABNORMAL HIGH (ref 70–99)
Potassium: 3.3 mmol/L — ABNORMAL LOW (ref 3.5–5.1)
Sodium: 134 mmol/L — ABNORMAL LOW (ref 135–145)
Total Bilirubin: 1.5 mg/dL — ABNORMAL HIGH (ref 0.3–1.2)
Total Protein: 5.6 g/dL — ABNORMAL LOW (ref 6.5–8.1)

## 2018-04-15 LAB — MAGNESIUM: Magnesium: 1.5 mg/dL — ABNORMAL LOW (ref 1.7–2.4)

## 2018-04-15 LAB — CBC WITH DIFFERENTIAL/PLATELET
Abs Immature Granulocytes: 0.03 10*3/uL (ref 0.00–0.07)
BASOS PCT: 1 %
Basophils Absolute: 0 10*3/uL (ref 0.0–0.1)
Eosinophils Absolute: 0 10*3/uL (ref 0.0–0.5)
Eosinophils Relative: 1 %
HCT: 24 % — ABNORMAL LOW (ref 39.0–52.0)
Hemoglobin: 7.9 g/dL — ABNORMAL LOW (ref 13.0–17.0)
Immature Granulocytes: 1 %
Lymphocytes Relative: 24 %
Lymphs Abs: 1 10*3/uL (ref 0.7–4.0)
MCH: 34.3 pg — ABNORMAL HIGH (ref 26.0–34.0)
MCHC: 32.9 g/dL (ref 30.0–36.0)
MCV: 104.3 fL — ABNORMAL HIGH (ref 80.0–100.0)
Monocytes Absolute: 0.5 10*3/uL (ref 0.1–1.0)
Monocytes Relative: 12 %
NEUTROS PCT: 61 %
Neutro Abs: 2.6 10*3/uL (ref 1.7–7.7)
Platelets: 46 10*3/uL — ABNORMAL LOW (ref 150–400)
RBC: 2.3 MIL/uL — ABNORMAL LOW (ref 4.22–5.81)
RDW: 13.5 % (ref 11.5–15.5)
WBC: 4.1 10*3/uL (ref 4.0–10.5)
nRBC: 0 % (ref 0.0–0.2)

## 2018-04-15 MED ORDER — LORAZEPAM 2 MG/ML IJ SOLN
0.0000 mg | Freq: Four times a day (QID) | INTRAMUSCULAR | Status: DC
  Administered 2018-04-15: 2 mg via INTRAVENOUS
  Filled 2018-04-15: qty 1

## 2018-04-15 MED ORDER — METOPROLOL SUCCINATE ER 50 MG PO TB24
100.0000 mg | ORAL_TABLET | Freq: Every day | ORAL | Status: DC
  Administered 2018-04-15 – 2018-04-16 (×2): 100 mg via ORAL
  Filled 2018-04-15 (×2): qty 2

## 2018-04-15 MED ORDER — DEXMEDETOMIDINE HCL IN NACL 400 MCG/100ML IV SOLN: 0.2000 ug/kg/h | INTRAVENOUS | Status: DC

## 2018-04-15 MED ORDER — MORPHINE SULFATE (PF) 2 MG/ML IV SOLN
1.0000 mg | INTRAVENOUS | Status: DC | PRN
  Administered 2018-04-15: 2 mg via INTRAVENOUS
  Filled 2018-04-15: qty 1

## 2018-04-15 MED ORDER — LORAZEPAM 2 MG/ML IJ SOLN: 0.0000 mg | Freq: Two times a day (BID) | INTRAMUSCULAR | Status: DC

## 2018-04-15 MED ORDER — MAGNESIUM SULFATE 4 GM/100ML IV SOLN
4.0000 g | Freq: Once | INTRAVENOUS | Status: AC
  Administered 2018-04-15: 4 g via INTRAVENOUS
  Filled 2018-04-15: qty 100

## 2018-04-15 MED ORDER — LORAZEPAM 1 MG PO TABS: 1.0000 mg | ORAL_TABLET | Freq: Four times a day (QID) | ORAL | Status: DC | PRN

## 2018-04-15 MED ORDER — SACUBITRIL-VALSARTAN 24-26 MG PO TABS
1.0000 | ORAL_TABLET | Freq: Two times a day (BID) | ORAL | Status: DC
  Administered 2018-04-15 – 2018-04-16 (×3): 1 via ORAL
  Filled 2018-04-15 (×4): qty 1

## 2018-04-15 MED ORDER — LORAZEPAM 2 MG/ML IJ SOLN
1.0000 mg | INTRAMUSCULAR | Status: DC | PRN
  Administered 2018-04-15 – 2018-04-17 (×7): 2 mg via INTRAVENOUS
  Filled 2018-04-15 (×7): qty 1

## 2018-04-15 MED ORDER — LORAZEPAM 2 MG/ML IJ SOLN: 1.0000 mg | Freq: Four times a day (QID) | INTRAMUSCULAR | Status: DC | PRN

## 2018-04-15 MED ORDER — RIVAROXABAN 20 MG PO TABS
20.0000 mg | ORAL_TABLET | Freq: Every day | ORAL | Status: DC
  Filled 2018-04-15: qty 1

## 2018-04-15 MED ORDER — DOFETILIDE 250 MCG PO CAPS
250.0000 ug | ORAL_CAPSULE | Freq: Two times a day (BID) | ORAL | Status: DC
  Administered 2018-04-15 – 2018-04-16 (×3): 250 ug via ORAL
  Filled 2018-04-15 (×4): qty 1

## 2018-04-15 MED ORDER — POTASSIUM CHLORIDE CRYS ER 20 MEQ PO TBCR
40.0000 meq | EXTENDED_RELEASE_TABLET | Freq: Once | ORAL | Status: AC
  Administered 2018-04-15: 40 meq via ORAL
  Filled 2018-04-15: qty 2

## 2018-04-15 NOTE — Progress Notes (Signed)
[facility] [place] NAME: Patrick Roberts    MR#:  [id number]  DATE OF BIRTH:  [birth year]  SUBJECTIVE:   Patient states he is feeling fine this morning.  No chest pain or shortness of breath.  No concerns today.  REVIEW OF SYSTEMS:  Review of Systems  Constitutional: Negative for chills and fever.  HENT: Negative for congestion and sore throat.   Eyes: Negative for blurred vision and double vision.  Respiratory: Negative for cough and shortness of breath.   Cardiovascular: Negative for chest pain and palpitations.  Gastrointestinal: Negative for nausea and vomiting.  Genitourinary: Negative for dysuria and urgency.  Musculoskeletal: Positive for falls. Negative for back pain and neck pain.  Neurological: Negative for dizziness and headaches.  Psychiatric/Behavioral: Negative for depression. The patient is not nervous/anxious.     DRUG ALLERGIES:  No Known Allergies VITALS:  Blood pressure 125/68, pulse 99, temperature 99.1 F (37.3 C), temperature source Oral, resp. rate 18, height 6\' 1"  (1.854 m), weight 84.1 kg, SpO2 94 %. PHYSICAL EXAMINATION:  Physical Exam  GENERAL:  68 y.o.-year-old patient lying in the bed with no acute distress.  EYES: Pupils equal, round, reactive to light and accommodation. No scleral icterus. Extraocular muscles intact.  HEENT: Head atraumatic, normocephalic. Oropharynx and nasopharynx clear.  NECK:  Supple, no jugular venous distention. No thyroid enlargement, no tenderness.  LUNGS: Normal breath sounds bilaterally, no wheezing, rales,rhonchi or crepitation. No use of accessory muscles of respiration.  CARDIOVASCULAR: Cardiac, regular rhythm, S1, S2 normal. No murmurs, rubs, or gallops.  ABDOMEN: Soft, nontender, nondistended. Bowel sounds present. No organomegaly or mass.  EXTREMITIES: No pedal edema, cyanosis, or clubbing. +abrasions and ecchymoses on the lower extremities bilaterally NEUROLOGIC: Cranial  nerves II through XII are intact. Muscle strength 5/5 in all extremities. Sensation intact. Gait not checked. + Tremulous PSYCHIATRIC: The patient is alert and oriented x 3.  SKIN: + Laceration to posterior scalp, +abrasions to all extremities  LABORATORY PANEL:  Male CBC Recent Labs  Lab 04/15/18 0827  WBC 4.1  HGB 7.9*  HCT 24.0*  PLT 46*   ------------------------------------------------------------------------------------------------------------------ Chemistries  Recent Labs  Lab 04/15/18 0827  NA 134*  K 3.3*  CL 98  CO2 26  GLUCOSE 136*  BUN 21  CREATININE 1.34*  CALCIUM 7.9*  MG 1.5*  AST 25  ALT 22  ALKPHOS 51  BILITOT 1.5*   RADIOLOGY:  No results found. ASSESSMENT AND PLAN:   Acute encephalopathy secondary to alcohol abuse -Off Precedex -CIWA protocol -Thiamine, folate, MVI -SW consult for outpatient resources for alcohol rehabilitation  Severe hypotension-likely due to hypovolemia.  Resolved with fluid resuscitation.  No signs of infection to suggest sepsis. -Blood cultures with no growth to date -Continue pressors -Continue IV fluids -Antibiotics have been discontinued  Left patellar fracture -Ortho consulted -Knee immobilizer in place  Hypokalemia/hypomagnesemia -Replete and recheck  Paroxysmal atrial fibrillation- mostly rate controlled -Hold Xarelto for now -Holding home diltiazem, Tikosyn, metoprolol in the setting of hypotension  Acute renal failure-due to hypotension.  Creatinine improving with fluids. -Nephrology consulted- continue IVFs  Macrocytic anemia- likely due to alcoholism  DVT prophylaxis- SCDs in light of bleeding head laceration.  All the records are reviewed and case discussed with Care Management/Social Worker. Management plans discussed with the patient, family and they are in agreement.  CODE STATUS: Full Code  TOTAL TIME TAKING CARE OF THIS PATIENT: 35 minutes.   More than 50% of the time  was spent in  counseling/coordination of care: YES  POSSIBLE D/C IN 1-2 DAYS, DEPENDING ON CLINICAL CONDITION.   Berna Spare Lamyia Cdebaca M.D on 04/15/2018 at 8:50 PM  Between 7am to 6pm - Pager - [phone]  After 6pm go to [email or web address] - Proofreader  [facility]  Office  [phone]  CC: Primary care physician; Minna Merritts, MD  Note: This dictation was prepared with Dragon dictation along with smaller phrase technology. Any transcriptional errors that result from this process are unintentional.

## 2018-04-15 NOTE — Progress Notes (Signed)
Pharmacy Electrolyte Monitoring Consult:  Pharmacy consulted to assist in monitoring and replacing electrolytes in this 68 y.o. male admitted on 04/12/2018 with Fall and Laceration   Labs:  Sodium (mmol/Roberts)  Date Value  04/15/2018 134 (Roberts)  07/10/2017 143   Potassium (mmol/Roberts)  Date Value  04/15/2018 3.3 (Roberts)   Magnesium (mg/dL)  Date Value  04/15/2018 1.5 (Roberts)   Phosphorus (mg/dL)  Date Value  04/13/2018 3.6   Calcium (mg/dL)  Date Value  04/15/2018 7.9 (Roberts)   Albumin (g/dL)  Date Value  04/15/2018 2.8 (Roberts)  01/17/2016 4.6    Assessment/Plan: Will order potassium 22mEq PO x 1 and magnesium 4g IV x 1.   Patient's sodium is 134, patient receiving LR at 167mL/hr. Will continue to monitor.   Patient continues on CIWA, will replace for goal magnesium ~ 2.   Will check BMP/Magnesium with am labs.   Pharmacy will continue to monitor and adjust per consult.   Patrick Roberts,Patrick Roberts 04/15/2018 2:10 PM

## 2018-04-15 NOTE — Progress Notes (Signed)
Patient transferred from CCU at 1730. IV fluids running, Foley in place.  Reviewed orders and started Tele. On Low bed. RA.

## 2018-04-15 NOTE — Progress Notes (Signed)
[facility]  ROUNDING NOTE   Subjective:   Left knee placed in immobilizer.   LR at 136mL/hr  Objective:  Vital signs in last 24 hours:  Temp:  [96.7 F (35.9 C)-98.5 F (36.9 C)] 98.5 F (36.9 C) (03/02 0400) Pulse Rate:  [88-133] 90 (03/02 0600) Resp:  [0-27] 12 (03/02 0600) BP: (66-116)/(55-83) 106/78 (03/02 0600) SpO2:  [93 %-100 %] 97 % (03/02 0600)  Weight change:  Filed Weights   04/12/18 1846 04/13/18 0030  Weight: 83.9 kg 84.1 kg    Intake/Output: I/O last 3 completed shifts: In: 4585.4 [P.O.:720; I.V.:3865.4] Out: 1325 [Urine:1325]   Intake/Output this shift:  No intake/output data recorded.  Physical Exam: General: NAD,   Head: +lacerations  Eyes: Anicteric, PERRL  Neck: Supple, trachea midline  Lungs:  Clear to auscultation  Heart: Regular rate and rhythm  Abdomen:  Soft, nontender,   Extremities: no peripheral edema. Left lower extremity immobilizer  Neurologic: +tremor  Skin: No lesions        Basic Metabolic Panel: Recent Labs  Lab 04/12/18 2000 04/13/18 0402 04/13/18 1309 04/15/18 0827  NA 133* 137 136 134*  K 6.0* 5.1 5.1 3.3*  CL 104 103 98 98  CO2 11* 14* 16* 26  GLUCOSE 59* 86 96 136*  BUN 40* 36* 33* 21  CREATININE 3.07* 2.54* 2.18* 1.34*  CALCIUM 7.0* 7.1* 7.4* 7.9*  MG  --  2.1  --  1.5*  PHOS  --  3.6  --   --     Liver Function Tests: Recent Labs  Lab 04/12/18 2000 04/13/18 1309 04/15/18 0827  AST 64*  --  25  ALT 37  --  22  ALKPHOS 53  --  51  BILITOT 1.2  --  1.5*  PROT 5.5*  --  5.6*  ALBUMIN 3.2* 3.3* 2.8*   No results for input(s): LIPASE, AMYLASE in the last 168 hours. No results for input(s): AMMONIA in the last 168 hours.  CBC: Recent Labs  Lab 04/12/18 1848 04/12/18 2244 04/13/18 0402 04/14/18 1615 04/15/18 0827  WBC 6.3  --  6.5 4.2 4.1  NEUTROABS  --   --   --  2.7 2.6  HGB 12.4* 11.9* 10.8* 9.0* 7.9*  HCT 38.0*  --  32.4* 27.5* 24.0*  MCV 105.6*  --  104.9* 105.8* 104.3*   PLT 102*  --  92* 56* 46*    Cardiac Enzymes: Recent Labs  Lab 04/12/18 2000  CKTOTAL 129  TROPONINI <0.03    BNP: Invalid input(s): POCBNP  CBG: Recent Labs  Lab 04/13/18 2052 04/14/18 0401 04/14/18 0739 04/14/18 1139 04/14/18 2341  GLUCAP 142* 159* 175* 216* 158*    Microbiology: Results for orders placed or performed during the hospital encounter of 04/12/18  CULTURE, BLOOD (ROUTINE X 2) w Reflex to ID Panel     Status: None (Preliminary result)   Collection Time: 04/12/18 10:44 PM  Result Value Ref Range Status   Specimen Description BLOOD RIGHT WRIST  Final   Special Requests   Final    BOTTLES DRAWN AEROBIC AND ANAEROBIC Blood Culture adequate volume   Culture   Final    NO GROWTH 2 DAYS Performed at [facility], [address] [place], [place] [address]    Report Status PENDING  Incomplete  CULTURE, BLOOD (ROUTINE X 2) w Reflex to ID Panel     Status: None (Preliminary result)   Collection Time: 04/12/18 10:44 PM  Result Value Ref Range  Status   Specimen Description BLOOD LEFT ANTECUBITAL  Final   Special Requests   Final    BOTTLES DRAWN AEROBIC AND ANAEROBIC Blood Culture adequate volume   Culture   Final    NO GROWTH 2 DAYS Performed at [facility], [address] [place], [place] [address]    Report Status PENDING  Incomplete  MRSA PCR Screening     Status: None   Collection Time: 04/12/18 11:38 PM  Result Value Ref Range Status   MRSA by PCR NEGATIVE NEGATIVE Final    Comment:        The GeneXpert MRSA Assay (FDA approved for NASAL specimens only), is one component of a comprehensive MRSA colonization surveillance program. It is not intended to diagnose MRSA infection nor to guide or monitor treatment for MRSA infections. Performed at [facility], [address] [place], [place] [address]     Coagulation Studies: No results for input(s): LABPROT, INR in the last 72 hours.  Urinalysis: No results  for input(s): COLORURINE, LABSPEC, PHURINE, GLUCOSEU, HGBUR, BILIRUBINUR, KETONESUR, PROTEINUR, UROBILINOGEN, NITRITE, LEUKOCYTESUR in the last 72 hours.  Invalid input(s): APPERANCEUR    Imaging: Dg Knee Left Port  Result Date: 04/13/2018 CLINICAL DATA:  Swelling and bruising anterior to the patella after a fall. EXAM: PORTABLE LEFT KNEE - 1-2 VIEW COMPARISON:  None. FINDINGS: Moderate to severe tricompartment degenerative changes in the left knee with triple compartment narrowing and osteophyte formation. Cartilaginous calcifications. There is a linear fracture through the midportion of the patella in the sagittal dimension. If clinically indicated, this could be better evaluated with sunrise patellar view. No additional fractures or subluxation identified. There is a large left knee effusion with calcifications demonstrated in the suprapatellar region and posterolateral recess. This may represent loose bodies, synovial osteochondromatosis, or dystrophic calcification related to old hemorrhage or infection. IMPRESSION: 1. Nondisplaced sagittal fracture through the midportion of the patella. 2. Large left knee effusion with calcifications in the suprapatellar region and posterolateral recess. 3. Moderate to severe tricompartment degenerative changes. Electronically Signed   By: Lucienne Capers M.D.   On: 04/13/2018 20:49     Medications:   . dexmedetomidine (PRECEDEX) IV infusion 0.5 mcg/kg/hr (04/15/18 0825)  . lactated ringers 125 mL/hr at 04/15/18 0826  . magnesium sulfate 1 - 4 g bolus IVPB    . norepinephrine (LEVOPHED) Adult infusion Stopped (04/13/18 1131)   . diltiazem  180 mg Oral Daily  . folic acid  1 mg Oral Daily  . LORazepam  0-4 mg Intravenous Q6H   Followed by  . [START ON 04/17/2018] LORazepam  0-4 mg Intravenous Q12H  . multivitamin with minerals  1 tablet Oral Daily  . pantoprazole  40 mg Oral Q1200  . PHENObarbital  32.5 mg Intravenous BID  . potassium chloride  40 mEq  Oral Once  . thiamine  100 mg Oral Daily   Or  . thiamine  100 mg Intravenous Daily   acetaminophen **OR** acetaminophen, diazepam, fentaNYL (SUBLIMAZE) injection, LORazepam **OR** LORazepam, metoprolol tartrate, ondansetron **OR** ondansetron (ZOFRAN) IV  Assessment/ Plan:  Patrick Roberts is a 68 y.o. white male with alcohol abuse, atrial fibrillation, hypertension, congestive heart failure, who was admitted to [facility] on 04/12/2018 for acute alcohol withdrawal.   1. Acute renal failure 2. Hyperkalemia 3. Chronic kidney disease stage III. Baseline creatinine of 1.33, GFR of 53 on 08/30/17 4. Metabolic acidosis 5. Hypotension 6. Macrocytic anemia 7. EtOh abuse  Impression - Continue LR infusion -  CIWA protocol.   Will sign off. Please call with questions.    LOS: 3 Patrick Roberts 3/2/202010:02 AM

## 2018-04-16 ENCOUNTER — Inpatient Hospital Stay: Payer: Medicare Other

## 2018-04-16 ENCOUNTER — Inpatient Hospital Stay (HOSPITAL_COMMUNITY)
Admit: 2018-04-16 | Discharge: 2018-04-16 | Disposition: A | Discharge status: Home / Self Care | Payer: Medicare Other | Attending: Internal Medicine | Admitting: Internal Medicine

## 2018-04-16 ENCOUNTER — Inpatient Hospital Stay: Admit: 2018-04-16 | Payer: Medicare Other

## 2018-04-16 DIAGNOSIS — I361 Nonrheumatic tricuspid (valve) insufficiency: Secondary | ICD-10-CM

## 2018-04-16 DIAGNOSIS — I340 Nonrheumatic mitral (valve) insufficiency: Secondary | ICD-10-CM

## 2018-04-16 LAB — BASIC METABOLIC PANEL
Anion gap: 11 (ref 5–15)
BUN: 18 mg/dL (ref 8–23)
CO2: 25 mmol/L (ref 22–32)
Calcium: 8.1 mg/dL — ABNORMAL LOW (ref 8.9–10.3)
Chloride: 101 mmol/L (ref 98–111)
Creatinine, Ser: 1.3 mg/dL — ABNORMAL HIGH (ref 0.61–1.24)
GFR calc Af Amer: >60 mL/min (ref >60)
GFR calc non Af Amer: 56 mL/min — ABNORMAL LOW (ref >60)
Glucose, Bld: 105 mg/dL — ABNORMAL HIGH (ref 70–99)
Potassium: 3.5 mmol/L (ref 3.5–5.1)
Sodium: 137 mmol/L (ref 135–145)

## 2018-04-16 LAB — RESPIRATORY PANEL BY PCR

## 2018-04-16 LAB — URINALYSIS, COMPLETE (UACMP) WITH MICROSCOPIC
Bacteria, UA: NONE SEEN
Bilirubin Urine: NEGATIVE
Glucose, UA: 150 mg/dL — AB
Ketones, ur: 5 mg/dL — AB
Leukocytes,Ua: NEGATIVE
Nitrite: NEGATIVE
Protein, ur: NEGATIVE mg/dL
Specific Gravity, Urine: 1.009 (ref 1.005–1.030)
pH: 6 (ref 5.0–8.0)

## 2018-04-16 LAB — RENAL FUNCTION PANEL
Albumin: 3 g/dL — ABNORMAL LOW (ref 3.5–5.0)
Anion gap: 10 (ref 5–15)
BUN: 17 mg/dL (ref 8–23)
CO2: 25 mmol/L (ref 22–32)
Calcium: 7.9 mg/dL — ABNORMAL LOW (ref 8.9–10.3)
Chloride: 101 mmol/L (ref 98–111)
Creatinine, Ser: 1.51 mg/dL — ABNORMAL HIGH (ref 0.61–1.24)
GFR calc Af Amer: 54 mL/min — ABNORMAL LOW (ref >60)
GFR calc non Af Amer: 47 mL/min — ABNORMAL LOW (ref >60)
Glucose, Bld: 106 mg/dL — ABNORMAL HIGH (ref 70–99)
POTASSIUM: 3.5 mmol/L (ref 3.5–5.1)
Phosphorus: 1.7 mg/dL — ABNORMAL LOW (ref 2.5–4.6)
Sodium: 136 mmol/L (ref 135–145)

## 2018-04-16 LAB — CBC WITH DIFFERENTIAL/PLATELET
ABS IMMATURE GRANULOCYTES: 0.04 10*3/uL (ref 0.00–0.07)
Basophils Absolute: 0 10*3/uL (ref 0.0–0.1)
Basophils Relative: 1 %
Eosinophils Absolute: 0.2 10*3/uL (ref 0.0–0.5)
Eosinophils Relative: 3 %
HCT: 24.8 % — ABNORMAL LOW (ref 39.0–52.0)
HEMOGLOBIN: 8 g/dL — AB (ref 13.0–17.0)
Immature Granulocytes: 1 %
LYMPHS ABS: 1.3 10*3/uL (ref 0.7–4.0)
Lymphocytes Relative: 24 %
MCH: 34.5 pg — ABNORMAL HIGH (ref 26.0–34.0)
MCHC: 32.3 g/dL (ref 30.0–36.0)
MCV: 106.9 fL — ABNORMAL HIGH (ref 80.0–100.0)
MONOS PCT: 15 %
Monocytes Absolute: 0.8 10*3/uL (ref 0.1–1.0)
Neutro Abs: 3.1 10*3/uL (ref 1.7–7.7)
Neutrophils Relative %: 56 %
Platelets: 55 10*3/uL — ABNORMAL LOW (ref 150–400)
RBC: 2.32 MIL/uL — ABNORMAL LOW (ref 4.22–5.81)
RDW: 13.9 % (ref 11.5–15.5)
WBC: 5.4 10*3/uL (ref 4.0–10.5)
nRBC: 0 % (ref 0.0–0.2)

## 2018-04-16 LAB — TSH: TSH: 1.094 u[IU]/mL (ref 0.350–4.500)

## 2018-04-16 LAB — MAGNESIUM
Magnesium: 1.8 mg/dL (ref 1.7–2.4)
Magnesium: 1.9 mg/dL (ref 1.7–2.4)

## 2018-04-16 LAB — GLUCOSE, CAPILLARY
Glucose-Capillary: 96 mg/dL (ref 70–99)
Glucose-Capillary: 128 mg/dL — ABNORMAL HIGH (ref 70–99)
Glucose-Capillary: 120 mg/dL — ABNORMAL HIGH (ref 70–99)
Glucose-Capillary: 101 mg/dL — ABNORMAL HIGH (ref 70–99)

## 2018-04-16 LAB — PREALBUMIN: Prealbumin: 10.3 mg/dL — ABNORMAL LOW (ref 18–38)

## 2018-04-16 LAB — PROCALCITONIN: Procalcitonin: 0.36 ng/mL

## 2018-04-16 LAB — PHOSPHORUS: Phosphorus: 1.4 mg/dL — ABNORMAL LOW (ref 2.5–4.6)

## 2018-04-16 LAB — VITAMIN B12: Vitamin B-12: 529 pg/mL (ref 180–914)

## 2018-04-16 LAB — LACTIC ACID, PLASMA: Lactic Acid, Venous: 1.2 mmol/L (ref 0.5–1.9)

## 2018-04-16 LAB — AMMONIA: AMMONIA: 22 umol/L (ref 9–35)

## 2018-04-16 LAB — FOLATE: Folate: 11 ng/mL (ref >5.9)

## 2018-04-16 MED ORDER — SODIUM PHOSPHATES 45 MMOLE/15ML IV SOLN
10.0000 mmol | Freq: Once | INTRAVENOUS | Status: AC
  Administered 2018-04-16: 10 mmol via INTRAVENOUS
  Filled 2018-04-16: qty 3.33

## 2018-04-16 MED ORDER — CHLORDIAZEPOXIDE HCL 25 MG PO CAPS
25.0000 mg | ORAL_CAPSULE | Freq: Four times a day (QID) | ORAL | Status: DC
  Administered 2018-04-16: 25 mg via ORAL
  Filled 2018-04-16: qty 1

## 2018-04-16 MED ORDER — LORAZEPAM 2 MG/ML IJ SOLN
1.0000 mg | Freq: Four times a day (QID) | INTRAMUSCULAR | Status: DC
  Administered 2018-04-16 (×2): 1 mg via INTRAVENOUS
  Filled 2018-04-16 (×2): qty 1

## 2018-04-16 MED ORDER — HYDROXYZINE HCL 25 MG PO TABS
25.0000 mg | ORAL_TABLET | Freq: Four times a day (QID) | ORAL | Status: DC | PRN
  Filled 2018-04-16: qty 1

## 2018-04-16 MED ORDER — MAGNESIUM SULFATE IN D5W 1-5 GM/100ML-% IV SOLN
1.0000 g | Freq: Once | INTRAVENOUS | Status: AC
  Administered 2018-04-16: 1 g via INTRAVENOUS
  Filled 2018-04-16: qty 100

## 2018-04-16 MED ORDER — LORAZEPAM 2 MG/ML IJ SOLN: 1.0000 mg | Freq: Two times a day (BID) | INTRAMUSCULAR | Status: DC

## 2018-04-16 MED ORDER — FUROSEMIDE 10 MG/ML IJ SOLN
20.0000 mg | Freq: Once | INTRAMUSCULAR | Status: AC
  Administered 2018-04-16: 20 mg via INTRAVENOUS
  Filled 2018-04-16: qty 4

## 2018-04-16 MED ORDER — CHLORDIAZEPOXIDE HCL 25 MG PO CAPS: 25.0000 mg | ORAL_CAPSULE | Freq: Four times a day (QID) | ORAL | Status: DC | PRN

## 2018-04-16 MED ORDER — CHLORDIAZEPOXIDE HCL 25 MG PO CAPS: 25.0000 mg | ORAL_CAPSULE | ORAL | Status: DC

## 2018-04-16 MED ORDER — METOPROLOL TARTRATE 5 MG/5ML IV SOLN
5.0000 mg | Freq: Four times a day (QID) | INTRAVENOUS | Status: DC
  Administered 2018-04-16 – 2018-04-18 (×6): 5 mg via INTRAVENOUS
  Filled 2018-04-16 (×6): qty 5

## 2018-04-16 MED ORDER — LORAZEPAM 2 MG/ML IJ SOLN: 1.0000 mg | Freq: Every day | INTRAMUSCULAR | Status: DC

## 2018-04-16 MED ORDER — SODIUM CHLORIDE 0.9 % IV SOLN
3.0000 g | Freq: Four times a day (QID) | INTRAVENOUS | Status: DC
  Administered 2018-04-16 – 2018-04-17 (×3): 3 g via INTRAVENOUS
  Filled 2018-04-16 (×7): qty 3

## 2018-04-16 MED ORDER — LOPERAMIDE HCL 2 MG PO CAPS
2.0000 mg | ORAL_CAPSULE | ORAL | Status: DC | PRN
  Filled 2018-04-16: qty 2

## 2018-04-16 MED ORDER — IPRATROPIUM-ALBUTEROL 0.5-2.5 (3) MG/3ML IN SOLN
3.0000 mL | Freq: Once | RESPIRATORY_TRACT | Status: AC
  Administered 2018-04-16: 3 mL via RESPIRATORY_TRACT
  Filled 2018-04-16: qty 3

## 2018-04-16 MED ORDER — CHLORDIAZEPOXIDE HCL 25 MG PO CAPS: 25.0000 mg | ORAL_CAPSULE | Freq: Every day | ORAL | Status: DC

## 2018-04-16 MED ORDER — ACETAMINOPHEN 10 MG/ML IV SOLN
1000.0000 mg | Freq: Once | INTRAVENOUS | Status: AC
  Administered 2018-04-16: 1000 mg via INTRAVENOUS
  Filled 2018-04-16: qty 100

## 2018-04-16 MED ORDER — LORAZEPAM 2 MG/ML IJ SOLN: 1.0000 mg | Freq: Three times a day (TID) | INTRAMUSCULAR | Status: DC

## 2018-04-16 MED ORDER — PANTOPRAZOLE SODIUM 40 MG IV SOLR
40.0000 mg | INTRAVENOUS | Status: DC
  Administered 2018-04-17 – 2018-04-18 (×2): 40 mg via INTRAVENOUS
  Filled 2018-04-16 (×2): qty 40

## 2018-04-16 MED ORDER — POTASSIUM CHLORIDE CRYS ER 20 MEQ PO TBCR
40.0000 meq | EXTENDED_RELEASE_TABLET | Freq: Once | ORAL | Status: DC
  Filled 2018-04-16: qty 2

## 2018-04-16 MED ORDER — VITAMIN B-1 100 MG PO TABS: 100.0000 mg | ORAL_TABLET | Freq: Every day | ORAL | Status: DC

## 2018-04-16 MED ORDER — CHLORDIAZEPOXIDE HCL 25 MG PO CAPS: 25.0000 mg | ORAL_CAPSULE | Freq: Three times a day (TID) | ORAL | Status: DC

## 2018-04-16 NOTE — Progress Notes (Addendum)
[facility] [place] NAME: Patrick Roberts    MR#:  [id number]  DATE OF BIRTH:  February 02, 1951  SUBJECTIVE:   Patient with temperature up to 102 overnight.  He also had some hypoxia this morning to 89% on 2 L, and was bumped up to 3 L O2.  He states he overall feels okay, just very weak.  Denies any chest pain or shortness of breath.  He states that he typically has a tremor at baseline.  REVIEW OF SYSTEMS:  Review of Systems  Constitutional: Negative for chills and fever.  HENT: Negative for congestion and sore throat.   Eyes: Negative for blurred vision and double vision.  Respiratory: Negative for cough and shortness of breath.   Cardiovascular: Negative for chest pain and palpitations.  Gastrointestinal: Negative for nausea and vomiting.  Genitourinary: Negative for dysuria and urgency.  Musculoskeletal: Positive for falls and joint pain. Negative for back pain and neck pain.  Neurological: Negative for dizziness and headaches.  Psychiatric/Behavioral: Negative for depression. The patient is not nervous/anxious.     DRUG ALLERGIES:  No Known Allergies VITALS:  Blood pressure (!) 156/71, pulse (!) 109, temperature 98.5 F (36.9 C), temperature source Oral, resp. rate 18, height 6\' 1"  (1.854 m), weight 84.1 kg, SpO2 92 %. PHYSICAL EXAMINATION:  Physical Exam  GENERAL:  68 y.o.-year-old patient lying in the bed with no acute distress.  EYES: Pupils equal, round, reactive to light and accommodation. No scleral icterus. Extraocular muscles intact.  HEENT: Head atraumatic, normocephalic. Oropharynx and nasopharynx clear.  NECK:  Supple, no jugular venous distention. No thyroid enlargement, no tenderness.  LUNGS: Normal breath sounds bilaterally, no wheezing, rales,rhonchi or crepitation. No use of accessory muscles of respiration.  Nasal cannula in place. CARDIOVASCULAR: Tachycardic, regular rhythm, S1, S2 normal. No murmurs, rubs, or gallops.    ABDOMEN: Soft, nontender, nondistended. Bowel sounds present. No organomegaly or mass.  EXTREMITIES: No pedal edema, cyanosis, or clubbing. +abrasions and ecchymoses on the lower extremities bilaterally NEUROLOGIC: Cranial nerves II through XII are intact. + Global weakness. Sensation intact. Gait not checked. + Tremulous  PSYCHIATRIC: The patient is alert and oriented x 3.  SKIN: + Laceration to posterior scalp, +abrasions to all extremities  LABORATORY PANEL:  Male CBC Recent Labs  Lab 04/15/18 0827  WBC 4.1  HGB 7.9*  HCT 24.0*  PLT 46*   ------------------------------------------------------------------------------------------------------------------ Chemistries  Recent Labs  Lab 04/15/18 0827  04/16/18 0557  NA 134*   < > 137  K 3.3*   < > 3.5  CL 98   < > 101  CO2 26   < > 25  GLUCOSE 136*   < > 105*  BUN 21   < > 18  CREATININE 1.34*   < > 1.30*  CALCIUM 7.9*   < > 8.1*  MG 1.5*   < > 1.9  AST 25  --   --   ALT 22  --   --   ALKPHOS 51  --   --   BILITOT 1.5*  --   --    < > = values in this interval not displayed.   RADIOLOGY:  Dg Chest Port 1 View  Result Date: 04/16/2018 CLINICAL DATA:  68 year old male with shortness of breath. Fall with left knee fracture. EXAM: PORTABLE CHEST 1 VIEW COMPARISON:  Portable chest 04/12/2018. FINDINGS: Portable AP upright view at 0429 hours. Stable right IJ central line. Increased bibasilar opacity mostly obscuring  the diaphragm now. Increased pulmonary interstitial opacity elsewhere. No pneumothorax. No definite air bronchograms. Stable cardiac size and mediastinal contours. Visualized tracheal air column is within normal limits. Paucity of bowel gas in the upper abdomen. IMPRESSION: Worsening ventilation since 04/12/2018 favored to reflect pulmonary edema with small pleural effusions. Top differential consideration is acute viral/atypical respiratory infection. Electronically Signed   By: Genevie Ann M.D.   On: 04/16/2018 06:58    ASSESSMENT AND PLAN:   Acute encephalopathy secondary to alcohol abuse- resolved.  Patient now mental status baseline. -Off Precedex -CIWA protocol -Thiamine, folate, MVI -SW consult for outpatient resources for alcohol rehabilitation  Acute hypoxic respiratory failure-secondary to pulmonary edema versus viral process versus aspiration pneumonia. Chest x-ray this morning with worsening ventilation, likely due to pulmonary edema with small pleural effusions.  May also have acute viral respiratory infection.  After discussion with RN this morning, it appears that patient may be silently aspirating.  Patient currently on 3 L O2. -Change all meds to IV and make patient n.p.o. -Start Unasyn -Stop IV fluids and will give a dose of Lasix x1 -SLP consult -RVP pending -Check echo  Fever-patient had temperature to 102F overnight.  Likely due to above. -Check CBC with differential and procalcitonin -Start Unasyn as above  History of chronic systolic congestive heart failure-echo in 2017 with EF 20 to 25%.  Most recent echo 07/16/17 with EF 55 to 60%. -Will give a dose of IV Lasix today -Continue metoprolol -Will have to hold Entresto, now that patient is n.p.o. -Check echo  Left patellar fracture -Ortho consulted -Knee immobilizer in place -Weightbearing as tolerated to left leg -Needs to follow-up in Ortho clinic in 2 weeks  Paroxysmal atrial fibrillation- currently in sinus tachycardia. -Hold Xarelto for now -Continue home metoprolol -Hold diltiazem and Tikosyn patient is n.p.o., will restart as able.  CKD 3-creatinine at baseline -Continue to monitor  Macrocytic anemia- likely due to alcoholism -Folate normal -Vitamin B12 pending  Alcohol abuse-patient interested in alcohol cessation -Continue CIWA -Discussed Librium taper with patient today.  He is interested, but will need to hold off for now as patient is n.p.o.  DVT prophylaxis- SCDs in light of bleeding head  laceration.  All the records are reviewed and case discussed with Care Management/Social Worker. Management plans discussed with the patient, family and they are in agreement.  CODE STATUS: Full Code  TOTAL TIME TAKING CARE OF THIS PATIENT: 45 minutes.   More than 50% of the time was spent in counseling/coordination of care: YES  POSSIBLE D/C IN 1-2 DAYS, DEPENDING ON CLINICAL CONDITION.   Berna Spare Mayo M.D on 04/16/2018 at 11:21 AM  Between 7am to 6pm - Pager - [phone]  After 6pm go to [email or web address] - Proofreader  [facility]  Office  [phone]  CC: Primary care physician; Minna Merritts, MD  Note: This dictation was prepared with Dragon dictation along with smaller phrase technology. Any transcriptional errors that result from this process are unintentional.

## 2018-04-16 NOTE — NC FL2 (Signed)
[place] LEVEL OF CARE SCREENING TOOL     IDENTIFICATION  Patient Name: Patrick Roberts Birthdate: 1950/10/29 Sex: male Admission Date (Current Location): 04/12/2018  [place] and [place] Number:  Engineering geologist and Address:  [facility], [address] [place], [place] [address]      Provider Number: [id number]  Attending Physician Name and Address:  Sela Hua, MD  Relative Name and Phone Number:       Current Level of Care: Hospital Recommended Level of Care: [place] Prior Approval Number:    Date Approved/Denied:   PASRR Number: [id number])  Discharge Plan: SNF    Current Diagnoses: Patient Active Problem List   Diagnosis Date Noted  . Hypotension 04/12/2018  . Visit for monitoring Tikosyn therapy 08/27/2017  . Atrial fibrillation ([place]) 07/26/2017  . Congestive dilated cardiomyopathy ([place]) 11/05/2015  . Atrial fibrillation, persistent 10/12/2015  . Alcohol abuse 10/04/2015  . Bilateral edema of lower extremity 10/04/2015  . Abnormal liver enzymes 01/06/2015  . Essential (primary) hypertension 01/06/2015  . Pure hypercholesterolemia 01/06/2015  . Basal cell carcinoma of face 12/30/2014    Orientation RESPIRATION BLADDER Height & Weight     Self, Time, Situation, Place  O2(2 Liters Oxygen. ) Continent Weight: 185 lb 6.5 oz (84.1 kg) Height:  6\' 1"  (185.4 cm)  BEHAVIORAL SYMPTOMS/MOOD NEUROLOGICAL BOWEL NUTRITION STATUS      Continent Diet(Diet: NPO to be advanced. )  AMBULATORY STATUS COMMUNICATION OF NEEDS Skin   Extensive Assist Verbally Other (Comment)(Laceration on head)                       Personal Care Assistance Level of Assistance  Bathing, Feeding, Dressing Bathing Assistance: Limited assistance Feeding assistance: Independent Dressing Assistance: Limited assistance     Functional Limitations Info  Sight, Hearing, Speech Sight Info: Adequate Hearing Info:  Adequate Speech Info: Adequate    SPECIAL CARE FACTORS FREQUENCY  PT (By licensed PT), OT (By licensed OT)     PT Frequency: (5) OT Frequency: (5)            Contractures      Additional Factors Info  Code Status, Allergies Code Status Info: (Full Code. ) Allergies Info: (No Known Allergies. )           Current Medications (04/16/2018):  This is the current hospital active medication list Current Facility-Administered Medications  Medication Dose Route Frequency Provider Last Rate Last Dose  . chlordiazePOXIDE (LIBRIUM) capsule 25 mg  25 mg Oral Q6H PRN Mayo, Pete Pelt, MD      . chlordiazePOXIDE (LIBRIUM) capsule 25 mg  25 mg Oral QID Sela Hua, MD   25 mg at 04/16/18 0844   Followed by  . [START ON 04/17/2018] chlordiazePOXIDE (LIBRIUM) capsule 25 mg  25 mg Oral TID Mayo, Pete Pelt, MD       Followed by  . [START ON 04/18/2018] chlordiazePOXIDE (LIBRIUM) capsule 25 mg  25 mg Oral BH-qamhs Mayo, Pete Pelt, MD       Followed by  . [START ON 04/19/2018] chlordiazePOXIDE (LIBRIUM) capsule 25 mg  25 mg Oral Daily Mayo, Pete Pelt, MD      . diltiazem (CARDIZEM CD) 24 hr capsule 180 mg  180 mg Oral Daily Awilda Bill, NP   180 mg at 04/16/18 0844  . dofetilide (TIKOSYN) capsule 250 mcg  250 mcg Oral BID Tyler Pita, MD   250 mcg at  04/16/18 0816  . folic acid (FOLVITE) tablet 1 mg  1 mg Oral Daily Dustin Flock, MD   1 mg at 04/16/18 5329  . hydrOXYzine (ATARAX/VISTARIL) tablet 25 mg  25 mg Oral Q6H PRN Mayo, Pete Pelt, MD      . loperamide (IMODIUM) capsule 2-4 mg  2-4 mg Oral PRN Mayo, Pete Pelt, MD      . LORazepam (ATIVAN) injection 1-2 mg  1-2 mg Intravenous Q2H PRN Tyler Pita, MD   2 mg at 04/16/18 0747  . metoprolol succinate (TOPROL-XL) 24 hr tablet 100 mg  100 mg Oral Daily Tyler Pita, MD   100 mg at 04/16/18 0815  . metoprolol tartrate (LOPRESSOR) injection 2.5-5 mg  2.5-5 mg Intravenous Q6H PRN Awilda Bill, NP   5 mg at 04/14/18 1845   . morphine 2 MG/ML injection 1-2 mg  1-2 mg Intravenous Q4H PRN Tyler Pita, MD   2 mg at 04/15/18 1708  . multivitamin with minerals tablet 1 tablet  1 tablet Oral Daily Dustin Flock, MD   1 tablet at 04/16/18 [phone]  . ondansetron (ZOFRAN) tablet 4 mg  4 mg Oral Q6H PRN Dustin Flock, MD       Or  . ondansetron (ZOFRAN) injection 4 mg  4 mg Intravenous Q6H PRN Dustin Flock, MD      . pantoprazole (PROTONIX) EC tablet 40 mg  40 mg Oral Q1200 Tukov-Yual, Magdalene S, NP   40 mg at 04/15/18 1554  . potassium chloride SA (K-DUR,KLOR-CON) CR tablet 40 mEq  40 mEq Oral Once Mayo, Pete Pelt, MD      . sacubitril-valsartan (ENTRESTO) 24-26 mg per tablet  1 tablet Oral BID Tyler Pita, MD   1 tablet at 04/16/18 0845  . sodium phosphate 10 mmol in dextrose 5 % 250 mL infusion  10 mmol Intravenous Once Rito Ehrlich A, RPH      . [START ON 04/17/2018] thiamine (VITAMIN B-1) tablet 100 mg  100 mg Oral Daily Mayo, Pete Pelt, MD         Discharge Medications: Please see discharge summary for a list of discharge medications.  Relevant Imaging Results:  Relevant Lab Results:   Additional Information (SSN: [id number])  Meghen Akopyan, Veronia Beets, LCSW

## 2018-04-16 NOTE — Progress Notes (Signed)
Patient developed cough, with expiratory wheezing. 2 liters oxygen placed for sats in 80s. MD notified. New orders placed. Will continue to monitor.

## 2018-04-16 NOTE — Care Management Important Message (Signed)
Important Message  Patient Details  Name: Patrick Roberts MRN: [id number] Date of Birth: Sep 27, 1950   Medicare Important Message Given:  Yes    Juliann Pulse A Alison Kubicki 04/16/2018, 10:45 AM

## 2018-04-16 NOTE — Consult Note (Signed)
Pharmacy Antibiotic Note  Patrick Roberts is a 68 y.o. male admitted on 04/12/2018 with a fall and now has aspiration pneumonia on CXR. This patient is aspirating all po medications. Pharmacy has been consulted for Unasyn dosing.  Plan: Start Unasyn 3 grams IV every 6 hours  Height: 6\' 1"  (185.4 cm) Weight: 185 lb 6.5 oz (84.1 kg) IBW/kg (Calculated) : 79.9  Temp (24hrs), Avg:99.4 F (37.4 C), Min:98.4 F (36.9 C), Max:102.5 F (39.2 C)  Recent Labs  Lab 04/12/18 1848  04/12/18 2057 04/12/18 2244 04/13/18 0402 04/13/18 1309 04/14/18 1615 04/15/18 0827 04/16/18 0340 04/16/18 0557  WBC 6.3  --   --   --  6.5  --  4.2 4.1  --   --   CREATININE  --    < >  --   --  2.54* 2.18*  --  1.34* 1.51* 1.30*  LATICACIDVEN  --   --  3.6* 3.0*  --   --   --   --   --   --    < > = values in this interval not displayed.    Estimated Creatinine Clearance: 61.5 mL/min (A) (by C-G formula based on SCr of 1.3 mg/dL (H)).    No Known Allergies  Antimicrobials this admission: Unasyn 3/3 >>  Cefepime 2/29 x1  Microbiology results: 2/28 BCx: NGTD 2/28 MRSA PCR: negative  Thank you for allowing pharmacy to be a part of this patient's care.  Dallie Piles, PharmD 04/16/2018 11:01 AM

## 2018-04-16 NOTE — Progress Notes (Signed)
Subjective:  Patient still lethargic.  Responds to basic commands today.  Denies significant knee pain.    Objective:   VITALS:   Vitals:   04/16/18 0123 04/16/18 0348 04/16/18 0419 04/16/18 0746  BP:    (!) 156/71  Pulse:    (!) 109  Resp:    18  Temp:  98.7 F (37.1 C)  98.5 F (36.9 C)  TempSrc:  Oral  Oral  SpO2: 94% 91% 92%   Weight:      Height:        PHYSICAL EXAM: Left knee: Patient lying in bed.   Knee immobilizer in place. Neurovascular intact Sensation intact distally Intact pulses distally Dorsiflexion/Plantar flexion intact Compartment soft  LABS  Results for orders placed or performed during the hospital encounter of 04/12/18 (from the past 24 hour(s))  Glucose, capillary     Status: Abnormal   Collection Time: 04/15/18  5:57 PM  Result Value Ref Range   Glucose-Capillary 124 (H) 70 - 99 mg/dL  Glucose, capillary     Status: Abnormal   Collection Time: 04/15/18  8:44 PM  Result Value Ref Range   Glucose-Capillary 131 (H) 70 - 99 mg/dL  Renal function panel     Status: Abnormal   Collection Time: 04/16/18  3:40 AM  Result Value Ref Range   Sodium 136 135 - 145 mmol/L   Potassium 3.5 3.5 - 5.1 mmol/L   Chloride 101 98 - 111 mmol/L   CO2 25 22 - 32 mmol/L   Glucose, Bld 106 (H) 70 - 99 mg/dL   BUN 17 8 - 23 mg/dL   Creatinine, Ser 1.51 (H) 0.61 - 1.24 mg/dL   Calcium 7.9 (L) 8.9 - 10.3 mg/dL   Phosphorus 1.7 (L) 2.5 - 4.6 mg/dL   Albumin 3.0 (L) 3.5 - 5.0 g/dL   GFR calc non Af Amer 47 (L) >60 mL/min   GFR calc Af Amer 54 (L) >60 mL/min   Anion gap 10 5 - 15  Magnesium     Status: None   Collection Time: 04/16/18  3:40 AM  Result Value Ref Range   Magnesium 1.8 1.7 - 2.4 mg/dL  Basic metabolic panel     Status: Abnormal   Collection Time: 04/16/18  5:57 AM  Result Value Ref Range   Sodium 137 135 - 145 mmol/L   Potassium 3.5 3.5 - 5.1 mmol/L   Chloride 101 98 - 111 mmol/L   CO2 25 22 - 32 mmol/L   Glucose, Bld 105 (H) 70 - 99 mg/dL    BUN 18 8 - 23 mg/dL   Creatinine, Ser 1.30 (H) 0.61 - 1.24 mg/dL   Calcium 8.1 (L) 8.9 - 10.3 mg/dL   GFR calc non Af Amer 56 (L) >60 mL/min   GFR calc Af Amer >60 >60 mL/min   Anion gap 11 5 - 15  Magnesium     Status: None   Collection Time: 04/16/18  5:57 AM  Result Value Ref Range   Magnesium 1.9 1.7 - 2.4 mg/dL  Phosphorus     Status: Abnormal   Collection Time: 04/16/18  5:57 AM  Result Value Ref Range   Phosphorus 1.4 (L) 2.5 - 4.6 mg/dL  Folate     Status: None   Collection Time: 04/16/18  5:57 AM  Result Value Ref Range   Folate 11.0 >5.9 ng/mL  TSH     Status: None   Collection Time: 04/16/18  5:57 AM  Result Value Ref Range  TSH 1.094 0.350 - 4.500 uIU/mL  Ammonia     Status: None   Collection Time: 04/16/18  5:57 AM  Result Value Ref Range   Ammonia 22 9 - 35 umol/L  Procalcitonin - Baseline     Status: None   Collection Time: 04/16/18  5:57 AM  Result Value Ref Range   Procalcitonin 0.36 ng/mL  Glucose, capillary     Status: None   Collection Time: 04/16/18  7:48 AM  Result Value Ref Range   Glucose-Capillary 96 70 - 99 mg/dL    Dg Chest Port 1 View  Result Date: 04/16/2018 CLINICAL DATA:  68 year old male with shortness of breath. Fall with left knee fracture. EXAM: PORTABLE CHEST 1 VIEW COMPARISON:  Portable chest 04/12/2018. FINDINGS: Portable AP upright view at 0429 hours. Stable right IJ central line. Increased bibasilar opacity mostly obscuring the diaphragm now. Increased pulmonary interstitial opacity elsewhere. No pneumothorax. No definite air bronchograms. Stable cardiac size and mediastinal contours. Visualized tracheal air column is within normal limits. Paucity of bowel gas in the upper abdomen. IMPRESSION: Worsening ventilation since 04/12/2018 favored to reflect pulmonary edema with small pleural effusions. Top differential consideration is acute viral/atypical respiratory infection. Electronically Signed   By: Genevie Ann M.D.   On: 04/16/2018 06:58     Assessment/Plan:     Active Problems:   Hypotension  Continue knee immobilizer at all times except for washing.  Patient may WBAT on right LE as pain allows in his immobilizer.   Followup with orthopaedics in 2 weeks.    Thornton Park , MD 04/16/2018, 10:56 AM

## 2018-04-16 NOTE — Evaluation (Addendum)
Clinical/Bedside Swallow Evaluation Patient Details  Name: Patrick Roberts MRN: [id number] Date of Birth: July 31, 1950  Today's Date: 04/16/2018 Time: SLP Start Time (ACUTE ONLY): 1100 SLP Stop Time (ACUTE ONLY): 1200 SLP Time Calculation (min) (ACUTE ONLY): 60 min  Past Medical History:  Past Medical History:  Diagnosis Date  . Alcohol abuse   . Chronic systolic (congestive) heart failure ([place])    a. TTE 8/17: EF 20-25%, mild conc LVH, mild MR, mild biatrial enlarge, mild-mod TR, PASP 45, aortic root 39 mm, small posterior pericardial effusion, Afib with RVR w/ HR 120-150 bpm; b. TTE 6/18: EF 60-65%, mod LVH, mild MR, mild biatrial enlarge, mildly dilated RV, midlly reduced RVSF, mild-mod TR  . Essential hypertension   . Persistent atrial fibrillation    a. on Xarelto; b. CHADS2VASc 3 (CHF, HTN, age x 1)  . Skin cancer of face    "under left eye"   Past Surgical History:  Past Surgical History:  Procedure Laterality Date  . KNEE ARTHROSCOPY Bilateral X 3   "twice on the left"  . MOHS SURGERY     "under left eye"  . TONSILLECTOMY     HPI:  Pt is a 68 y.o. male with a known history of alcohol abuse, essential hypertension, atrial fibrillation who is presenting to the emergency room after a fall and laceration to his head.  Patient states that he was drinking earlier and lost balance and fell.  In the emergency room he was noted to be hypothermic, and hypotensive.  He received 4 L of fluid bolus blood pressure still very low.  Patient otherwise is awake and alert denies any chest pain or shortness of breath. Pt is under CIWA precautions receiving Ativan d/t Acute encephalopathy secondary to alcohol abuse.    Assessment / Plan / Recommendation Clinical Impression  Pt appears to present w/ oropharyngeal phase dysphagia likely highly impacted by declined Cognitive status; agitation d/t Acute encephalopathy secondary to alcohol abuse - he is under CIWA precautions and receiving Ativan  (sedating affect) scheduled at this time per MD/NSG. He is at increased risk for dysphagia and aspiration at this time.  Pt exhibited oral phase deficits c/b increased oral phase time, reduced lingual movements, delayed A-P transit time, and reduced awareness of bolus. Verbal cues given to encourage pt to swallow and clear fully b/t trials. During the pharyngeal phase, no immediate, overt coughing noted; delayed throat clearing x1 after lengthier oral phase time. Suspect delayed pharyngeal swallow initiation d/t Cognitive/mental status at this time. No decline in respiratory status during/post po trials given at this evaluation. Pt required full feeding assistance. No unilateral weakness noted in mouth/face; pt did not follow through w/ OM exam despite verbal/tactile cues therefore a limited exam. Recommend a Dysphagia level 1 (puree) diet w/ Nectar consistency liquids - only give POs if FULLY awake/alert to safely participate and demonstrate awareness of the task of swallowing; eating/drinking. Recommend strict aspiration precautions; Pills given in Puree - Crushed as needed for safer swallowing, clearing. MD/NSG updated.  SLP Visit Diagnosis: Dysphagia, oropharyngeal phase (R13.12)    Aspiration Risk  Mild aspiration risk;Moderate aspiration risk;Risk for inadequate nutrition/hydration    Diet Recommendation  Dysphagia level 1 (puree) w/ Nectar consistency liquids; strict aspiration precautions including not feeding pt if unable to remain alert/awake and attentive to task of oral intake. Feeding support and supervision w/ all oral intake.  Medication Administration: Crushed with puree(for safer swallowing, clearing)    Other  Recommendations Recommended Consults: (Dietician f/u) Oral  Care Recommendations: Oral care BID;Staff/trained caregiver to provide oral care Other Recommendations: Order thickener from pharmacy;Prohibited food (jello, ice cream, thin soups);Remove water pitcher;Have oral suction  available   Follow up Recommendations (TBD)      Frequency and Duration min 3x week  2 weeks       Prognosis Prognosis for Safe Diet Advancement: Fair Barriers to Reach Goals: Cognitive deficits;Severity of deficits;Behavior Barriers/Prognosis Comment: ETOH abuse      Swallow Study   General Date of Onset: 04/12/18 HPI: Pt is a 68 y.o. male with a known history of alcohol abuse, essential hypertension, atrial fibrillation who is presenting to the emergency room after a fall and laceration to his head.  Patient states that he was drinking earlier and lost balance and fell.  In the emergency room he was noted to be hypothermic, and hypotensive.  He received 4 L of fluid bolus blood pressure still very low.  Patient otherwise is awake and alert denies any chest pain or shortness of breath. Pt is under CIWA precautions receiving Ativan d/t Acute encephalopathy secondary to alcohol abuse.  Type of Study: Bedside Swallow Evaluation Previous Swallow Assessment: none reported Diet Prior to this Study: NPO Temperature Spikes Noted: (wbc 5.4; temp elevated x1 in past 24 hours) Respiratory Status: Nasal cannula(2 liters) History of Recent Intubation: No Behavior/Cognition: Cooperative;Confused;Agitated;Impulsive;Distractible;Requires cueing;Doesn't follow directions(Awake) Oral Cavity Assessment: Dry Oral Care Completed by SLP: Yes(attempted but confused) Oral Cavity - Dentition: Missing dentition Vision: (n/a) Self-Feeding Abilities: Total assist Patient Positioning: Upright in bed(needed positioning) Baseline Vocal Quality: Low vocal intensity(mumbled/muttered speech) Volitional Cough: Cognitively unable to elicit Volitional Swallow: Unable to elicit    Oral/Motor/Sensory Function Overall Oral Motor/Sensory Function: Generalized oral weakness(poor follow through w/ OM exam d/t cognitive status) Facial Symmetry: Within Functional Limits Lingual Symmetry: Within Functional Limits(during  protrusion)   Ice Chips Ice chips: Not tested   Thin Liquid Thin Liquid: Not tested    Nectar Thick Nectar Thick Liquid: Impaired Presentation: Cup;Spoon;Straw(assisted by SLP; 2-3 trials via each method) Oral Phase Impairments: Poor awareness of bolus;Reduced lingual movement/coordination Oral phase functional implications: Prolonged oral transit Pharyngeal Phase Impairments: Suspected delayed Swallow;Throat Clearing - Delayed(x1 when oral phase was lengthier) Other Comments: verbal cues given to encourage swallowing   Honey Thick Honey Thick Liquid: Not tested   Puree Puree: Impaired Presentation: Spoon(fed; 8-9 trials) Oral Phase Impairments: Reduced lingual movement/coordination;Poor awareness of bolus;Reduced labial seal Oral Phase Functional Implications: Prolonged oral transit;Oral holding(x2 trials) Pharyngeal Phase Impairments: Suspected delayed Swallow(no other) Other Comments: Cognitive impact   Solid     Solid: Not tested      [name], MS, CCC-SLP , 04/16/2018,4:27 PM

## 2018-04-16 NOTE — Progress Notes (Signed)
Per chart patient is not alert and oriented and no family is at bedside. Clinical Social Worker (CSW) contacted patient's wife Gay Filler to discuss D/C plan. Per Gay Filler she wants patient to go to alcohol rehab. CSW explained that patient will have to be mobile and independent for that. Per wife patient will not be very mobile due to his left patellar fracture. CSW explained that PT will evaluate patient and make a recommendation of home health or SNF. Wife prefers for patient to go to SNF and is agreeable to SNF search in [place]. CSW explained that [facility] will have to approve SNF. Wife verbalized her understanding. FL2 complete and faxed out.   [employer], LCSW [phone]

## 2018-04-16 NOTE — Clinical Social Work Placement (Signed)
   CLINICAL SOCIAL WORK PLACEMENT  NOTE  Date:  04/16/2018  Patient Details  Name: Patrick Roberts MRN: [id number] Date of Birth: 09/19/1950  Clinical Social Work is seeking post-discharge placement for this patient at the [place] level of care (*CSW will initial, date and re-position this form in  chart as items are completed):  Yes   Patient/family provided with [place] Work Department's list of facilities offering this level of care within the geographic area requested by the patient (or if unable, by the patient's family).  Yes   Patient/family informed of their freedom to choose among providers that offer the needed level of care, that participate in Medicare, Medicaid or managed care program needed by the patient, have an available bed and are willing to accept the patient.  Yes   Patient/family informed of [facility]'s ownership interest in [facility] and [facility], as well as of the fact that they are under no obligation to receive care at these facilities.  PASRR submitted to EDS on 04/16/18     PASRR number received on 04/16/18     Existing PASRR number confirmed on       FL2 transmitted to all facilities in geographic area requested by pt/family on 04/16/18     FL2 transmitted to all facilities within larger geographic area on       Patient informed that his/her managed care company has contracts with or will negotiate with certain facilities, including the following:            Patient/family informed of bed offers received.  Patient chooses bed at       Physician recommends and patient chooses bed at      Patient to be transferred to   on  .  Patient to be transferred to facility by       Patient family notified on   of transfer.  Name of family member notified:        PHYSICIAN       Additional Comment:    _______________________________________________ [place], Veronia Beets, LCSW 04/16/2018, 2:12 PM

## 2018-04-16 NOTE — Progress Notes (Signed)
Received a message from [place], RN this afternoon that patient had another fever to 102.81F.  Currently has Unasyn running.  Will obtain blood cultures, urinalysis, urine culture, and lactic acid at this time.  Patient just had chest x-ray done this morning that showed likely pulmonary edema with small pleural effusions.  Procalcitonin this morning elevated to 0.36.  Will continue to monitor closely  Hyman Bible, MD

## 2018-04-16 NOTE — Progress Notes (Signed)
Pharmacy Electrolyte Monitoring Consult:  Pharmacy consulted to assist in monitoring and replacing electrolytes in this 68 y.o. male admitted on 04/12/2018 with Fall and Laceration   Labs:  Sodium (mmol/L)  Date Value  04/16/2018 137  07/10/2017 143   Potassium (mmol/L)  Date Value  04/16/2018 3.5   Magnesium (mg/dL)  Date Value  04/16/2018 1.9   Phosphorus (mg/dL)  Date Value  04/16/2018 1.4 (L)   Calcium (mg/dL)  Date Value  04/16/2018 8.1 (L)   Albumin (g/dL)  Date Value  04/16/2018 3.0 (L)  01/17/2016 4.6    Assessment/Plan: Patient's Magnesium level is 1.9 and Phosphorus is 1.4.  Will order Magnesium 1g IV x 1 dose and Sodium Phosphate 10 mmol in dextrose 5% 298ml IV x 1 dose administered over 6 hours.  Potassium level is within normal limits. No adjustments necessary.  Patient continues on CIWA, will replace for goal magnesium ~ 2.   Will check BMP/Magnesium/Phosphorus with am labs.   Pharmacy will continue to monitor and adjust per consult.   Adon Gehlhausen A Laylaa Guevarra 04/16/2018 7:49 AM

## 2018-04-16 NOTE — Progress Notes (Signed)
Dr. Brett Albino and I have been going back and forth communicating about the patient's condition as it has fluctuated throughout the day. She is currently up to date on the patient's status and orders have been received and put in appropriately. Respiratory therapist Barbie at bedside assessing patient's respiratory status.

## 2018-04-17 ENCOUNTER — Inpatient Hospital Stay: Payer: Medicare Other

## 2018-04-17 DIAGNOSIS — W19XXXA Unspecified fall, initial encounter: Secondary | ICD-10-CM

## 2018-04-17 DIAGNOSIS — R509 Fever, unspecified: Secondary | ICD-10-CM

## 2018-04-17 DIAGNOSIS — S82002A Unspecified fracture of left patella, initial encounter for closed fracture: Secondary | ICD-10-CM

## 2018-04-17 DIAGNOSIS — G9340 Encephalopathy, unspecified: Secondary | ICD-10-CM

## 2018-04-17 DIAGNOSIS — I480 Paroxysmal atrial fibrillation: Secondary | ICD-10-CM

## 2018-04-17 DIAGNOSIS — I10 Essential (primary) hypertension: Secondary | ICD-10-CM

## 2018-04-17 DIAGNOSIS — Z79899 Other long term (current) drug therapy: Secondary | ICD-10-CM

## 2018-04-17 DIAGNOSIS — Z7901 Long term (current) use of anticoagulants: Secondary | ICD-10-CM

## 2018-04-17 LAB — CBC WITH DIFFERENTIAL/PLATELET
Abs Immature Granulocytes: 0.02 10*3/uL (ref 0.00–0.07)
Basophils Absolute: 0 10*3/uL (ref 0.0–0.1)
Basophils Relative: 1 %
Eosinophils Absolute: 0 10*3/uL (ref 0.0–0.5)
Eosinophils Relative: 1 %
HCT: 24.1 % — ABNORMAL LOW (ref 39.0–52.0)
HEMOGLOBIN: 7.6 g/dL — AB (ref 13.0–17.0)
Immature Granulocytes: 0 %
Lymphocytes Relative: 25 %
Lymphs Abs: 1.1 10*3/uL (ref 0.7–4.0)
MCH: 34.2 pg — ABNORMAL HIGH (ref 26.0–34.0)
MCHC: 31.5 g/dL (ref 30.0–36.0)
MCV: 108.6 fL — ABNORMAL HIGH (ref 80.0–100.0)
Monocytes Absolute: 0.7 10*3/uL (ref 0.1–1.0)
Monocytes Relative: 15 %
Neutro Abs: 2.7 10*3/uL (ref 1.7–7.7)
Neutrophils Relative %: 58 %
Platelets: 61 10*3/uL — ABNORMAL LOW (ref 150–400)
RBC: 2.22 MIL/uL — AB (ref 4.22–5.81)
RDW: 13.4 % (ref 11.5–15.5)
WBC: 4.6 10*3/uL (ref 4.0–10.5)
nRBC: 0 % (ref 0.0–0.2)

## 2018-04-17 LAB — GLUCOSE, CAPILLARY
GLUCOSE-CAPILLARY: 98 mg/dL (ref 70–99)
Glucose-Capillary: 120 mg/dL — ABNORMAL HIGH (ref 70–99)
Glucose-Capillary: 111 mg/dL — ABNORMAL HIGH (ref 70–99)

## 2018-04-17 LAB — CULTURE, BLOOD (ROUTINE X 2)
Culture: NO GROWTH
Culture: NO GROWTH
Special Requests: ADEQUATE
Special Requests: ADEQUATE

## 2018-04-17 LAB — URINE CULTURE: Culture: NO GROWTH

## 2018-04-17 LAB — BASIC METABOLIC PANEL
Anion gap: 9 (ref 5–15)
BUN: 17 mg/dL (ref 8–23)
CO2: 26 mmol/L (ref 22–32)
CREATININE: 1.41 mg/dL — AB (ref 0.61–1.24)
Calcium: 7.8 mg/dL — ABNORMAL LOW (ref 8.9–10.3)
Chloride: 105 mmol/L (ref 98–111)
GFR calc Af Amer: 59 mL/min — ABNORMAL LOW (ref >60)
GFR calc non Af Amer: 51 mL/min — ABNORMAL LOW (ref >60)
Glucose, Bld: 115 mg/dL — ABNORMAL HIGH (ref 70–99)
Potassium: 3.3 mmol/L — ABNORMAL LOW (ref 3.5–5.1)
Sodium: 140 mmol/L (ref 135–145)

## 2018-04-17 LAB — PHOSPHORUS: Phosphorus: 2.5 mg/dL (ref 2.5–4.6)

## 2018-04-17 LAB — MAGNESIUM: Magnesium: 1.8 mg/dL (ref 1.7–2.4)

## 2018-04-17 LAB — CALCIUM, IONIZED: Calcium, Ionized, Serum: 4.8 mg/dL (ref 4.5–5.6)

## 2018-04-17 LAB — PROCALCITONIN: Procalcitonin: 0.35 ng/mL

## 2018-04-17 MED ORDER — VANCOMYCIN HCL 10 G IV SOLR
1500.0000 mg | INTRAVENOUS | Status: DC
  Filled 2018-04-17: qty 1500

## 2018-04-17 MED ORDER — SODIUM CHLORIDE 0.9 % IV SOLN
INTRAVENOUS | Status: DC
  Administered 2018-04-17 – 2018-04-18 (×2): via INTRAVENOUS

## 2018-04-17 MED ORDER — ACETAMINOPHEN 650 MG RE SUPP
325.0000 mg | RECTAL | Status: DC | PRN
  Filled 2018-04-17: qty 1

## 2018-04-17 MED ORDER — VANCOMYCIN HCL 10 G IV SOLR: 1500.0000 mg | INTRAVENOUS | Status: DC

## 2018-04-17 MED ORDER — VANCOMYCIN HCL 10 G IV SOLR
1500.0000 mg | INTRAVENOUS | Status: AC
  Administered 2018-04-17: 1500 mg via INTRAVENOUS
  Filled 2018-04-17: qty 1500

## 2018-04-17 MED ORDER — DILTIAZEM HCL ER COATED BEADS 180 MG PO CP24
180.0000 mg | ORAL_CAPSULE | Freq: Every day | ORAL | Status: DC
  Administered 2018-04-18 – 2018-04-21 (×4): 180 mg via ORAL
  Filled 2018-04-17 (×7): qty 1

## 2018-04-17 MED ORDER — VANCOMYCIN HCL 10 G IV SOLR
1500.0000 mg | INTRAVENOUS | Status: DC
  Administered 2018-04-18 – 2018-04-20 (×3): 1500 mg via INTRAVENOUS
  Filled 2018-04-17 (×4): qty 1500

## 2018-04-17 MED ORDER — LORAZEPAM 2 MG/ML IJ SOLN
2.0000 mg | Freq: Four times a day (QID) | INTRAMUSCULAR | Status: AC
  Administered 2018-04-17 (×3): 2 mg via INTRAVENOUS
  Filled 2018-04-17 (×3): qty 1

## 2018-04-17 MED ORDER — LORAZEPAM 2 MG/ML IJ SOLN
2.0000 mg | Freq: Three times a day (TID) | INTRAMUSCULAR | Status: DC
  Administered 2018-04-17 – 2018-04-18 (×2): 2 mg via INTRAVENOUS
  Filled 2018-04-17 (×2): qty 1

## 2018-04-17 MED ORDER — POTASSIUM CHLORIDE 10 MEQ/100ML IV SOLN
10.0000 meq | INTRAVENOUS | Status: AC
  Administered 2018-04-17 (×3): 10 meq via INTRAVENOUS
  Filled 2018-04-17: qty 100

## 2018-04-17 MED ORDER — LORAZEPAM 2 MG/ML IJ SOLN: 1.0000 mg | Freq: Two times a day (BID) | INTRAMUSCULAR | Status: DC

## 2018-04-17 MED ORDER — LORAZEPAM 2 MG/ML IJ SOLN: 1.0000 mg | Freq: Three times a day (TID) | INTRAMUSCULAR | Status: DC

## 2018-04-17 MED ORDER — LORAZEPAM 2 MG/ML IJ SOLN
4.0000 mg | INTRAMUSCULAR | Status: DC | PRN
  Administered 2018-04-17 (×2): 4 mg via INTRAVENOUS
  Filled 2018-04-17 (×2): qty 2

## 2018-04-17 MED ORDER — SACUBITRIL-VALSARTAN 24-26 MG PO TABS
1.0000 | ORAL_TABLET | Freq: Two times a day (BID) | ORAL | Status: DC
  Administered 2018-04-17 – 2018-04-21 (×9): 1 via ORAL
  Filled 2018-04-17 (×11): qty 1

## 2018-04-17 MED ORDER — MAGNESIUM SULFATE 2 GM/50ML IV SOLN: 2.0000 g | Freq: Once | INTRAVENOUS | Status: DC

## 2018-04-17 MED ORDER — SODIUM CHLORIDE 0.9 % IV SOLN
2.0000 g | Freq: Two times a day (BID) | INTRAVENOUS | Status: DC
  Administered 2018-04-17 – 2018-04-19 (×6): 2 g via INTRAVENOUS
  Filled 2018-04-17 (×10): qty 2

## 2018-04-17 MED ORDER — DOFETILIDE 250 MCG PO CAPS
250.0000 ug | ORAL_CAPSULE | ORAL | Status: DC
  Administered 2018-04-18 – 2018-04-23 (×6): 250 ug via ORAL
  Filled 2018-04-17 (×8): qty 1

## 2018-04-17 NOTE — Progress Notes (Signed)
Personally spoke with primary nurse, Allie, according to her, she will discontinue central line, no assistance needed at this time.   Sandie Ano RN IV VAST TEAM

## 2018-04-17 NOTE — Consult Note (Signed)
NAME: Patrick Roberts  DOB: 1950/05/18  MRN: [id number]  Date/Time: 04/17/2018 10:40 AM  REQUESTING PROVIDER: Brett Albino Subjective:  REASON FOR CONSULT: fever ?History from his wife- spoke to her on the phone and reviewed chart Patrick Roberts is a 68 y.o. male with a history of Afib, HTN, alcohol abuse was brought to the hospital on 2/28 as he was found on the floor  His wife lives in [place] Mon-Friday looking after her grandson and when she came home on Friday at 2pm she went upstairs and found him in the bed with a  bottle of vodka next to him,. An hour later she heard him fall on the floor and saw him on the floor face forward bleeding,he did not want her to help him so she left,  he tried to get up and fell backward bust his head and she called EMS and he was brought to the ED. Was in ICU for acute alcohol intoxication/encephalopathy and was on precedex drip. He was also hypotensive and hypothermic on presentation and  received fluids and warming blanket. He was on vanco and cefepime for a day or so and it was stopped as cultures neg.  Also noted to have left patellar fracture and ortho was consulted and he has a knee mobilizer He was transferred out of ICU , he started fever on 04/15/18 . Blood culture sent and he was initially started on Unasyn, as no response changed to vanco and cefepime today by the primary team and I am asked to see the patient   As per his wife he was having a cold and cough last week and had gotten some cold medicine but she is not sure whether he took it. No travel, no recent antibiotics H/o b/l knee injuries playing football and hasd had arthroscopy 30 yrs ago. Has had effusion in the rt knee which has been drained before No infection in the knees befotre No hardware in body. No Steroid use No gout Past Medical History:  Diagnosis Date  . Alcohol abuse   . Chronic systolic (congestive) heart failure ([place])    a. TTE 8/17: EF 20-25%, mild conc LVH, mild MR, mild biatrial  enlarge, mild-mod TR, PASP 45, aortic root 39 mm, small posterior pericardial effusion, Afib with RVR w/ HR 120-150 bpm; b. TTE 6/18: EF 60-65%, mod LVH, mild MR, mild biatrial enlarge, mildly dilated RV, midlly reduced RVSF, mild-mod TR  . Essential hypertension   . Persistent atrial fibrillation    a. on Xarelto; b. CHADS2VASc 3 (CHF, HTN, age x 1)  . Skin cancer of face    "under left eye"    Past Surgical History:  Procedure Laterality Date  . KNEE ARTHROSCOPY Bilateral X 3   "twice on the left"  . MOHS SURGERY     "under left eye"  . 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 Dementia Mother   . Heart disease Father   . Heart attack Father    No Known Allergies  ? Current Facility-Administered Medications  Medication Dose Route Frequency Provider Last Rate Last Dose  . 0.9 %  sodium chloride infusion   Intravenous Continuous Mayo, Pete Pelt, MD 75 mL/hr at 04/17/18 0734    . acetaminophen (TYLENOL) suppository 325 mg  325 mg Rectal Q4H PRN Harrie Foreman, MD      . ceFEPIme (MAXIPIME) 2  g in sodium chloride 0.9 % 100 mL IVPB  2 g Intravenous Q12H Rito Ehrlich A, RPH      . LORazepam (ATIVAN) injection 2 mg  2 mg Intravenous QID Harrie Foreman, MD   2 mg at 04/17/18 1033   Followed by  . LORazepam (ATIVAN) injection 2 mg  2 mg Intravenous TID Harrie Foreman, MD       Followed by  . [START ON 04/18/2018] LORazepam (ATIVAN) injection 1 mg  1 mg Intravenous TID Harrie Foreman, MD       Followed by  . [START ON 04/19/2018] LORazepam (ATIVAN) injection 1 mg  1 mg Intravenous BID Harrie Foreman, MD      . LORazepam (ATIVAN) injection 4 mg  4 mg Intravenous Q2H PRN Harrie Foreman, MD   4 mg at 04/17/18 0606  . metoprolol tartrate (LOPRESSOR) injection 2.5-5 mg  2.5-5 mg Intravenous Q6H PRN Awilda Bill, NP   5 mg at 04/14/18 1845  . metoprolol tartrate  (LOPRESSOR) injection 5 mg  5 mg Intravenous Q6H Mayo, Pete Pelt, MD   5 mg at 04/17/18 0512  . morphine 2 MG/ML injection 1-2 mg  1-2 mg Intravenous Q4H PRN Tyler Pita, MD   2 mg at 04/15/18 1708  . multivitamin with minerals tablet 1 tablet  1 tablet Oral Daily Dustin Flock, MD   1 tablet at 04/17/18 1032  . ondansetron (ZOFRAN) tablet 4 mg  4 mg Oral Q6H PRN Dustin Flock, MD       Or  . ondansetron (ZOFRAN) injection 4 mg  4 mg Intravenous Q6H PRN Dustin Flock, MD      . pantoprazole (PROTONIX) injection 40 mg  40 mg Intravenous Q24H Sela Hua, MD   40 mg at 04/17/18 1033  . potassium chloride 10 mEq in 100 mL IVPB  10 mEq Intravenous Q1 Hr x 3 Mayo, Pete Pelt, MD 100 mL/hr at 04/17/18 1033 10 mEq at 04/17/18 1033  . potassium chloride SA (K-DUR,KLOR-CON) CR tablet 40 mEq  40 mEq Oral Once Mayo, Pete Pelt, MD      . vancomycin (VANCOCIN) 1,500 mg in sodium chloride 0.9 % 500 mL IVPB  1,500 mg Intravenous Q24H Rito Ehrlich A, RPH      . [START ON 04/18/2018] vancomycin (VANCOCIN) 1,500 mg in sodium chloride 0.9 % 500 mL IVPB  1,500 mg Intravenous Q24H Nazari, Walid A, RPH         Abtx:  Anti-infectives (From admission, onward)   Start     Dose/Rate Route Frequency Ordered Stop   04/18/18 0800  vancomycin (VANCOCIN) 1,500 mg in sodium chloride 0.9 % 500 mL IVPB     1,500 mg 250 mL/hr over 120 Minutes Intravenous Every 24 hours 04/17/18 0830     04/18/18 0700  vancomycin (VANCOCIN) 1,500 mg in sodium chloride 0.9 % 500 mL IVPB  Status:  Discontinued     1,500 mg 250 mL/hr over 120 Minutes Intravenous Every 24 hours 04/17/18 0820 04/17/18 0830   04/17/18 1200  vancomycin (VANCOCIN) 1,500 mg in sodium chloride 0.9 % 500 mL IVPB     1,500 mg 250 mL/hr over 120 Minutes Intravenous Every 24 hours 04/17/18 0830 04/18/18 1159   04/17/18 1000  vancomycin (VANCOCIN) 1,500 mg in sodium chloride 0.9 % 500 mL IVPB  Status:  Discontinued     1,500 mg 250 mL/hr over 120 Minutes  Intravenous Every 24 hours 04/17/18 0820 04/17/18 0830  04/17/18 1000  ceFEPIme (MAXIPIME) 2 g in sodium chloride 0.9 % 100 mL IVPB     2 g 200 mL/hr over 30 Minutes Intravenous Every 12 hours 04/17/18 0830     04/16/18 1400  Ampicillin-Sulbactam (UNASYN) 3 g in sodium chloride 0.9 % 100 mL IVPB  Status:  Discontinued     3 g 200 mL/hr over 30 Minutes Intravenous Every 6 hours 04/16/18 1200 04/17/18 0724   04/15/18 0000  vancomycin (VANCOCIN) 1,500 mg in sodium chloride 0.9 % 500 mL IVPB  Status:  Discontinued     1,500 mg 250 mL/hr over 120 Minutes Intravenous Every 48 hours 04/13/18 0211 04/13/18 1236   04/13/18 1400  ceFEPIme (MAXIPIME) 1 g in sodium chloride 0.9 % 100 mL IVPB  Status:  Discontinued     1 g 200 mL/hr over 30 Minutes Intravenous Every 12 hours 04/13/18 0157 04/13/18 1236   04/13/18 1000  ceFEPIme (MAXIPIME) 1 g in sodium chloride 0.9 % 100 mL IVPB  Status:  Discontinued     1 g 200 mL/hr over 30 Minutes Intravenous Every 12 hours 04/12/18 2235 04/13/18 0157   04/12/18 2245  ceFEPIme (MAXIPIME) 2 g in sodium chloride 0.9 % 100 mL IVPB     2 g 200 mL/hr over 30 Minutes Intravenous  Once 04/12/18 2232 04/13/18 0237   04/12/18 2230  piperacillin-tazobactam (ZOSYN) IVPB 3.375 g  Status:  Discontinued     3.375 g 12.5 mL/hr over 240 Minutes Intravenous Every 8 hours 04/12/18 2229 04/12/18 2231   04/12/18 2230  vancomycin (VANCOCIN) 2,000 mg in sodium chloride 0.9 % 500 mL IVPB     2,000 mg 250 mL/hr over 120 Minutes Intravenous  Once 04/12/18 2229 04/13/18 0021      REVIEW OF SYSTEMS:  NA as patient confused and also unintelligible Objective:  VITALS:  BP 127/81 (BP Location: Left Wrist)   Pulse 95   Temp 98.1 F (36.7 C) (Oral)   Resp 18   Ht 6\' 1"  (1.854 m)   Wt 84.1 kg   SpO2 97%   BMI 24.46 kg/m  PHYSICAL EXAM:  General: awake, tremors, answers some questions appropriately, disheveled   Head: Normocephalic, without obvious abnormality, left parietal  area- staples- caked blood on the hair Eyes: Conjunctivae clear, anicteric sclerae. Pupils are equal ENT Nares normal. No drainage or sinus tenderness. , some blood on the lip. Neck: Supple, symmetrical, no adenopathy, thyroid: non tender no carotid bruit and no JVD. Back: No CVA tenderness. Lungs: b/l air entry- crepts bases Heart: irregular Abdomen: Soft, non-tender,not distended. Bowel sounds normal. No masses Extremities: swelling above the rt knee- soft, no tenderness Abrasion over both knees      Skin: bruising   Lymph: Cervical, supraclavicular normal. Neurologic: Grossly non-focal Pertinent Labs Lab Results CBC    Component Value Date/Time   WBC 4.6 04/17/2018 0516   RBC 2.22 (L) 04/17/2018 0516   HGB 7.6 (L) 04/17/2018 0516   HGB 14.3 07/10/2017 1518   HCT 24.1 (L) 04/17/2018 0516   HCT 42.0 07/10/2017 1518   PLT 61 (L) 04/17/2018 0516   PLT 106 (L) 07/10/2017 1518   MCV 108.6 (H) 04/17/2018 0516   MCV 99 (H) 07/10/2017 1518   MCH 34.2 (H) 04/17/2018 0516   MCHC 31.5 04/17/2018 0516   RDW 13.4 04/17/2018 0516   RDW 16.1 (H) 07/10/2017 1518   LYMPHSABS 1.1 04/17/2018 0516   LYMPHSABS 2.5 07/10/2017 1518   MONOABS 0.7 04/17/2018 0516   EOSABS  0.0 04/17/2018 0516   EOSABS 0.1 07/10/2017 1518   BASOSABS 0.0 04/17/2018 0516   BASOSABS 0.1 07/10/2017 1518    CMP Latest Ref Rng & Units 04/17/2018 04/16/2018 04/16/2018  Glucose 70 - 99 mg/dL 115(H) 105(H) 106(H)  BUN 8 - 23 mg/dL 17 18 17   Creatinine 0.61 - 1.24 mg/dL 1.41(H) 1.30(H) 1.51(H)  Sodium 135 - 145 mmol/L 140 137 136  Potassium 3.5 - 5.1 mmol/L 3.3(L) 3.5 3.5  Chloride 98 - 111 mmol/L 105 101 101  CO2 22 - 32 mmol/L 26 25 25   Calcium 8.9 - 10.3 mg/dL 7.8(L) 8.1(L) 7.9(L)  Total Protein 6.5 - 8.1 g/dL - - -  Total Bilirubin 0.3 - 1.2 mg/dL - - -  Alkaline Phos 38 - 126 U/L - - -  AST 15 - 41 U/L - - -  ALT 0 - 44 U/L - - -      Microbiology: Recent Results (from the past 240 hour(s))  CULTURE,  BLOOD (ROUTINE X 2) w Reflex to ID Panel     Status: None   Collection Time: 04/12/18 10:44 PM  Result Value Ref Range Status   Specimen Description BLOOD RIGHT WRIST  Final   Special Requests   Final    BOTTLES DRAWN AEROBIC AND ANAEROBIC Blood Culture adequate volume   Culture   Final    NO GROWTH 5 DAYS Performed at [facility], [address] [place], [place] [address]    Report Status 04/17/2018 FINAL  Final  CULTURE, BLOOD (ROUTINE X 2) w Reflex to ID Panel     Status: None   Collection Time: 04/12/18 10:44 PM  Result Value Ref Range Status   Specimen Description BLOOD LEFT ANTECUBITAL  Final   Special Requests   Final    BOTTLES DRAWN AEROBIC AND ANAEROBIC Blood Culture adequate volume   Culture   Final    NO GROWTH 5 DAYS Performed at [facility], [address] [place], [place] [address]    Report Status 04/17/2018 FINAL  Final  MRSA PCR Screening     Status: None   Collection Time: 04/12/18 11:38 PM  Result Value Ref Range Status   MRSA by PCR NEGATIVE NEGATIVE Final    Comment:        The GeneXpert MRSA Assay (FDA approved for NASAL specimens only), is one component of a comprehensive MRSA colonization surveillance program. It is not intended to diagnose MRSA infection nor to guide or monitor treatment for MRSA infections. Performed at [facility], [address] [place], [place] [address]   CULTURE, BLOOD (ROUTINE X 2) w Reflex to ID Panel     Status: None (Preliminary result)   Collection Time: 04/16/18  1:08 PM  Result Value Ref Range Status   Specimen Description BLOOD LEFT WRIST  Final   Special Requests   Final    BOTTLES DRAWN AEROBIC AND ANAEROBIC Blood Culture adequate volume   Culture   Final    NO GROWTH < 24 HOURS Performed at [facility], [address], [place], [place] [address]    Report Status PENDING  Incomplete  CULTURE, BLOOD (ROUTINE X 2) w Reflex to ID Panel     Status: None (Preliminary  result)   Collection Time: 04/16/18  1:16 PM  Result Value Ref Range Status   Specimen Description BLOOD BLOOD RIGHT HAND  Final   Special Requests   Final    BOTTLES DRAWN AEROBIC AND ANAEROBIC Blood Culture adequate  volume   Culture   Final    NO GROWTH < 24 HOURS Performed at [facility], [address] [place], [place] [address]    Report Status PENDING  Incomplete  Respiratory Panel by PCR     Status: None   Collection Time: 04/16/18  1:37 PM  Result Value Ref Range Status   Adenovirus NOT DETECTED NOT DETECTED Final   Coronavirus 229E NOT DETECTED NOT DETECTED Final    Comment: (NOTE) The Coronavirus on the Respiratory Panel, DOES NOT test for the novel  Coronavirus (2019 nCoV)    Coronavirus HKU1 NOT DETECTED NOT DETECTED Final   Coronavirus NL63 NOT DETECTED NOT DETECTED Final   Coronavirus OC43 NOT DETECTED NOT DETECTED Final   Metapneumovirus NOT DETECTED NOT DETECTED Final   Rhinovirus / Enterovirus NOT DETECTED NOT DETECTED Final   Influenza A NOT DETECTED NOT DETECTED Final   Influenza B NOT DETECTED NOT DETECTED Final   Parainfluenza Virus 1 NOT DETECTED NOT DETECTED Final   Parainfluenza Virus 2 NOT DETECTED NOT DETECTED Final   Parainfluenza Virus 3 NOT DETECTED NOT DETECTED Final   Parainfluenza Virus 4 NOT DETECTED NOT DETECTED Final   Respiratory Syncytial Virus NOT DETECTED NOT DETECTED Final   Bordetella pertussis NOT DETECTED NOT DETECTED Final   Chlamydophila pneumoniae NOT DETECTED NOT DETECTED Final   Mycoplasma pneumoniae NOT DETECTED NOT DETECTED Final    Comment: Performed at [facility], [place]. [address] [place], [place] [address]  Urine Culture     Status: None   Collection Time: 04/16/18  1:37 PM  Result Value Ref Range Status   Specimen Description   Final    URINE, RANDOM Performed at [facility], [address] [place], [place] [address]    Special Requests   Final    NONE Performed at [facility],  [address] [place], [place] [address]    Culture   Final    NO GROWTH Performed at [facility], [place] [address], [place], [place] [address]    Report Status 04/17/2018 FINAL  Final    IMAGING RESULTS:  I have personally reviewed the films ? Impression/Recommendation ?68 y.o. male with a history of Afib, HTN, alcohol abuse was brought to the hospital on 2/28 as he was found on the floor   Fever day 4 of hospitalization in  A patient who was admitted for alcohol intoxication and encephalopathy  D.D could be from central line, could be aspiration pneumonitis,atelectasis, could be hematoma of the knees ,  No h/o gout No UTI Currently on vanco and cefepime- continue both Remove Rt IJ central line Ultrasound rt knee ?  Fracture left patella  Afib ( paroxysmal) Of xarelto, on metoprolol  ___________________________________________________ Discussed with patient, requesting provider, his nurse and his wife

## 2018-04-17 NOTE — Progress Notes (Signed)
Pt continues to have severe tremors. MD paged and Ativan doses increased. Aspiration/seizure precautions implemented. Will continue to monitor.

## 2018-04-17 NOTE — Progress Notes (Addendum)
Pharmacy Electrolyte Monitoring Consult:  Pharmacy consulted to assist in monitoring and replacing electrolytes in this 68 y.o. male admitted on 04/12/2018 with Fall and Laceration   Labs:  Sodium (mmol/L)  Date Value  04/17/2018 140  07/10/2017 143   Potassium (mmol/L)  Date Value  04/17/2018 3.3 (L)   Magnesium (mg/dL)  Date Value  04/17/2018 1.8   Phosphorus (mg/dL)  Date Value  04/17/2018 2.5   Calcium (mg/dL)  Date Value  04/17/2018 7.8 (L)   Albumin (g/dL)  Date Value  04/16/2018 3.0 (L)  01/17/2016 4.6    Assessment/Plan: Patient's Potassium level is 3.3, Magnesium level is 1.8 and Phosphorus is 2.5.  Dr. Brett Albino has ordered Potassium Chloride 10 mEq IV q1hr x 3 doses.  Magnesium and Phosphorus level is within normal limits. No adjustments necessary.   Will check BMP/Magnesium/Phosphorus with am labs.   Pharmacy will continue to monitor and adjust per consult.   Pearla Dubonnet 04/17/2018 8:33 AM

## 2018-04-17 NOTE — Progress Notes (Signed)
Mews score 5. Rapid response nurse and MD made aware of VS. New order for tylenol suppository. Will administer PRN ativan per CCU charge request. Will continue to monitor.

## 2018-04-17 NOTE — Progress Notes (Signed)
Follow up with patient post-rapid response call from night shift. Patient in bed alert and seemingly oriented, mentioned his wife went to "her hair appointment". Vital signs stable, spoke with patient's bedside nurse who does not have any concerns at this time. Hospitalist currently rounding on patient also with no concerns related to rapid response follow up.

## 2018-04-17 NOTE — Progress Notes (Signed)
Pharmacy Antibiotic Note  Patrick Roberts is a 68 y.o. male admitted on 04/12/2018 with pneumonia.  Patient has been recently diagnosed with sepsis. Pharmacy has been consulted for vanc/cefepime dosing.  Plan:  Vancomycin 1500 mg IV q24 hrs. Goal AUC 400-550. Expected AUC: 476.4 SCr used: 1.41 mg/dL  Cefepime 2 g IV q12 hrs per CrCl of 56.7 ml/min  Height: 6\' 1"  (185.4 cm) Weight: 185 lb 6.5 oz (84.1 kg) IBW/kg (Calculated) : 79.9  Temp (24hrs), Avg:100.5 F (38.1 C), Min:97.6 F (36.4 C), Max:103.3 F (39.6 C)  Recent Labs  Lab 04/12/18 1848  04/12/18 2057 04/12/18 2244 04/13/18 0402 04/13/18 1309 04/14/18 1615 04/15/18 0827 04/16/18 0340 04/16/18 0557 04/16/18 1308 04/17/18 0516  WBC 6.3  --   --   --  6.5  --  4.2 4.1 5.4  --   --   --   CREATININE  --    < >  --   --  2.54* 2.18*  --  1.34* 1.51* 1.30*  --  1.41*  LATICACIDVEN  --   --  3.6* 3.0*  --   --   --   --   --   --  1.2  --    < > = values in this interval not displayed.    Estimated Creatinine Clearance: 56.7 mL/min (A) (by C-G formula based on SCr of 1.41 mg/dL (H)).    No Known Allergies  Thank you for allowing pharmacy to be a part of this patient's care.  Tobie Lords, PharmD, BCPS Clinical Pharmacist 04/17/2018

## 2018-04-17 NOTE — Progress Notes (Signed)
PT Cancellation Note  Patient Details Name: Patrick Roberts MRN: [id number] DOB: 22-Jan-1951   Cancelled Treatment:    Reason Eval/Treat Not Completed: Other (comment).  PT consult received.  Chart reviewed.  Nurse reporting pt doing better than earlier this morning but recommending no OOB mobility at this time.  Pt with L knee immobilizer at all times d/t L patellar fx.  Will hold PT at this time and re-attempt PT evaluation at a later date/time.  Leitha Bleak, PT 04/17/18, 8:50 AM [phone]

## 2018-04-17 NOTE — Progress Notes (Signed)
[facility] [place] NAME: Patrick Roberts    MR#:  [id number]  DATE OF BIRTH:  1950-12-11  SUBJECTIVE:   Patient continued to spike fevers up to 103F over the last 24 hours.  He states that he feels fine.  He denies any shortness of breath, cough, dysuria, urinary urgency, fevers, or chills.  No chest pain.  REVIEW OF SYSTEMS:  Review of Systems  Constitutional: Negative for chills and fever.  HENT: Negative for congestion and sore throat.   Eyes: Negative for blurred vision and double vision.  Respiratory: Negative for cough and shortness of breath.   Cardiovascular: Negative for chest pain and palpitations.  Gastrointestinal: Negative for nausea and vomiting.  Genitourinary: Negative for dysuria and urgency.  Musculoskeletal: Positive for falls and joint pain. Negative for back pain and neck pain.  Neurological: Negative for dizziness and headaches.  Psychiatric/Behavioral: Negative for depression. The patient is not nervous/anxious.     DRUG ALLERGIES:  No Known Allergies VITALS:  Blood pressure 117/84, pulse 91, temperature 98.2 F (36.8 C), temperature source Oral, resp. rate 18, height 6\' 1"  (1.854 m), weight 84.1 kg, SpO2 100 %. PHYSICAL EXAMINATION:  Physical Exam  GENERAL:  68 y.o.-year-old patient lying in the bed with no acute distress.  EYES: Pupils equal, round, reactive to light and accommodation. No scleral icterus. Extraocular muscles intact.  HEENT: Head atraumatic, normocephalic. Oropharynx and nasopharynx clear.  NECK:  Supple, no jugular venous distention. No thyroid enlargement, no tenderness.  LUNGS: Normal breath sounds bilaterally, no wheezing, rales,rhonchi or crepitation. No use of accessory muscles of respiration.  Nasal cannula in place. CARDIOVASCULAR: Mildly tachycardic, regular rhythm, S1, S2 normal. No murmurs, rubs, or gallops.  ABDOMEN: Soft, nontender, nondistended. Bowel sounds present. No  organomegaly or mass.  EXTREMITIES: No pedal edema, cyanosis, or clubbing. +abrasions and ecchymoses on the lower extremities bilaterally. + Knee immobilizer in place over left leg NEUROLOGIC: Cranial nerves II through XII are intact. + Global weakness. Sensation intact. Gait not checked. + Tremulous  PSYCHIATRIC: The patient is alert and oriented x 3.  SKIN: + Laceration to posterior scalp, +abrasions to all extremities  LABORATORY PANEL:  Male CBC Recent Labs  Lab 04/17/18 0516  WBC 4.6  HGB 7.6*  HCT 24.1*  PLT 61*   ------------------------------------------------------------------------------------------------------------------ Chemistries  Recent Labs  Lab 04/15/18 0827  04/17/18 0516  NA 134*   < > 140  K 3.3*   < > 3.3*  CL 98   < > 105  CO2 26   < > 26  GLUCOSE 136*   < > 115*  BUN 21   < > 17  CREATININE 1.34*   < > 1.41*  CALCIUM 7.9*   < > 7.8*  MG 1.5*   < > 1.8  AST 25  --   --   ALT 22  --   --   ALKPHOS 51  --   --   BILITOT 1.5*  --   --    < > = values in this interval not displayed.   RADIOLOGY:  Dg Chest 1 View  Result Date: 04/17/2018 CLINICAL DATA:  Fevers EXAM: CHEST  1 VIEW COMPARISON:  04/16/2018 FINDINGS: Cardiac shadow is within normal limits and stable. Right jugular central line is again seen. Lungs are well aerated bilaterally. Small left pleural effusion is noted. Bibasilar atelectatic changes are seen. No bony abnormality is noted. IMPRESSION: Bibasilar atelectasis and small left pleural effusion.  Electronically Signed   By: Inez Catalina M.D.   On: 04/17/2018 08:01   ASSESSMENT AND PLAN:   Fever-patient has spiked multiple fevers to 103F over the last 24 hours.  WBC normal.  PCT mildly elevated to 0.36.  Lactic acid negative.  Respiratory viral panel negative. Chest x-ray 3/3 with possible pulmonary edema.  Patient does have right IJ in place for being in the ICU. -Blood and urine cultures from 3/3 pending -Will broaden antibiotics to  vancomycin and cefepime -ID consult -Continue Tylenol as needed for fevers  Acute encephalopathy secondary to alcohol abuse- resolved.  Patient now mental status baseline. -CIWA protocol -Thiamine, folate, MVI -SW consult for outpatient resources for alcohol rehabilitation  Acute hypoxic respiratory failure- secondary to pulmonary edema versus viral process versus aspiration pneumonia.  Chest x-ray definitive pneumonia. -SLP recommended dysphagia 1 diet and nectar thick liquids -Antibiotics broadened from Unasyn to vancomycin and cefepime today -Echo pending  History of chronic systolic congestive heart failure-echo in 2017 with EF 20 to 25%.  Most recent echo 07/16/17 with EF 55 to 60%. -S/p 1 dose IV Lasix yesterday -Continue metoprolol -Restart Entresto -Echo pending  Left patellar fracture due to mechanical fall -Ortho consulted -Knee immobilizer in place -Weightbearing as tolerated to left leg -Needs to follow-up in Ortho clinic in 2 weeks  Paroxysmal atrial fibrillation- currently in NSR -Hold Xarelto for now -Continue home metoprolol -Restart diltiazem and Tikosyn  CKD 3-creatinine stable and at baseline -Continue to monitor  Macrocytic anemia- likely due to alcoholism -Folate and vitamin B12 normal this admission  Alcohol abuse-patient interested in alcohol cessation -Continue CIWA -Can likely start patient on a Librium taper  DVT prophylaxis- SCDs in light of bleeding head laceration.  All the records are reviewed and case discussed with Care Management/Social Worker. Management plans discussed with the patient, family and they are in agreement.  CODE STATUS: Full Code  TOTAL TIME TAKING CARE OF THIS PATIENT: 40 minutes.   More than 50% of the time was spent in counseling/coordination of care: YES  POSSIBLE D/C IN 2-3 DAYS, DEPENDING ON CLINICAL CONDITION.   Berna Spare  M.D on 04/17/2018 at 1:10 PM  Between 7am to 6pm - Pager - [phone]  After  6pm go to [email or web address] - Proofreader  [facility]  Office  [phone]  CC: Primary care physician; Minna Merritts, MD  Note: This dictation was prepared with Dragon dictation along with smaller phrase technology. Any transcriptional errors that result from this process are unintentional.

## 2018-04-18 LAB — BASIC METABOLIC PANEL
Anion gap: 11 (ref 5–15)
BUN: 18 mg/dL (ref 8–23)
CO2: 24 mmol/L (ref 22–32)
CREATININE: 1.33 mg/dL — AB (ref 0.61–1.24)
Calcium: 8.2 mg/dL — ABNORMAL LOW (ref 8.9–10.3)
Chloride: 105 mmol/L (ref 98–111)
GFR calc Af Amer: >60 mL/min (ref >60)
GFR calc non Af Amer: 55 mL/min — ABNORMAL LOW (ref >60)
Glucose, Bld: 116 mg/dL — ABNORMAL HIGH (ref 70–99)
Potassium: 3.4 mmol/L — ABNORMAL LOW (ref 3.5–5.1)
Sodium: 140 mmol/L (ref 135–145)

## 2018-04-18 LAB — GLUCOSE, CAPILLARY
GLUCOSE-CAPILLARY: 107 mg/dL — AB (ref 70–99)
Glucose-Capillary: 106 mg/dL — ABNORMAL HIGH (ref 70–99)
Glucose-Capillary: 145 mg/dL — ABNORMAL HIGH (ref 70–99)
Glucose-Capillary: 127 mg/dL — ABNORMAL HIGH (ref 70–99)

## 2018-04-18 LAB — PHOSPHORUS: Phosphorus: 2.4 mg/dL — ABNORMAL LOW (ref 2.5–4.6)

## 2018-04-18 LAB — ECHOCARDIOGRAM COMPLETE
Height: 73 in
Weight: 2966.51 oz

## 2018-04-18 LAB — MAGNESIUM: Magnesium: 1.7 mg/dL (ref 1.7–2.4)

## 2018-04-18 MED ORDER — POTASSIUM CHLORIDE 10 MEQ/100ML IV SOLN
10.0000 meq | Freq: Once | INTRAVENOUS | Status: AC
  Administered 2018-04-18: 10 meq via INTRAVENOUS
  Filled 2018-04-18: qty 100

## 2018-04-18 MED ORDER — FOLIC ACID 1 MG PO TABS
1.0000 mg | ORAL_TABLET | Freq: Every day | ORAL | Status: DC
  Administered 2018-04-18 – 2018-04-23 (×6): 1 mg via ORAL
  Filled 2018-04-18 (×6): qty 1

## 2018-04-18 MED ORDER — POTASSIUM CHLORIDE 10 MEQ/100ML IV SOLN: 10.0000 meq | Freq: Once | INTRAVENOUS | Status: DC

## 2018-04-18 MED ORDER — LORAZEPAM 2 MG/ML IJ SOLN
2.0000 mg | INTRAMUSCULAR | Status: DC | PRN
  Administered 2018-04-18 – 2018-04-19 (×2): 2 mg via INTRAVENOUS
  Filled 2018-04-18 (×2): qty 1

## 2018-04-18 MED ORDER — POTASSIUM CHLORIDE 10 MEQ/100ML IV SOLN
10.0000 meq | INTRAVENOUS | Status: AC
  Administered 2018-04-18 (×2): 10 meq via INTRAVENOUS
  Filled 2018-04-18 (×4): qty 100

## 2018-04-18 MED ORDER — MAGNESIUM SULFATE 2 GM/50ML IV SOLN
2.0000 g | Freq: Once | INTRAVENOUS | Status: AC
  Administered 2018-04-18: 2 g via INTRAVENOUS
  Filled 2018-04-18: qty 50

## 2018-04-18 MED ORDER — LORAZEPAM 2 MG PO TABS
2.0000 mg | ORAL_TABLET | Freq: Once | ORAL | Status: AC
  Administered 2018-04-18: 2 mg via ORAL
  Filled 2018-04-18: qty 1

## 2018-04-18 MED ORDER — PANTOPRAZOLE SODIUM 40 MG PO TBEC
40.0000 mg | DELAYED_RELEASE_TABLET | Freq: Every day | ORAL | Status: DC
  Administered 2018-04-19 – 2018-04-23 (×5): 40 mg via ORAL
  Filled 2018-04-18 (×5): qty 1

## 2018-04-18 MED ORDER — LORAZEPAM 1 MG PO TABS: 1.0000 mg | ORAL_TABLET | Freq: Two times a day (BID) | ORAL | Status: DC

## 2018-04-18 MED ORDER — LORAZEPAM 1 MG PO TABS
1.0000 mg | ORAL_TABLET | Freq: Three times a day (TID) | ORAL | Status: DC
  Administered 2018-04-18: 1 mg via ORAL
  Filled 2018-04-18 (×2): qty 1

## 2018-04-18 MED ORDER — VITAMIN B-1 100 MG PO TABS
100.0000 mg | ORAL_TABLET | Freq: Every day | ORAL | Status: DC
  Administered 2018-04-18 – 2018-04-23 (×6): 100 mg via ORAL
  Filled 2018-04-18 (×6): qty 1

## 2018-04-18 MED ORDER — METOPROLOL SUCCINATE ER 100 MG PO TB24
100.0000 mg | ORAL_TABLET | Freq: Every day | ORAL | Status: DC
  Administered 2018-04-18 – 2018-04-21 (×4): 100 mg via ORAL
  Filled 2018-04-18 (×4): qty 2
  Filled 2018-04-18: qty 1

## 2018-04-18 NOTE — Progress Notes (Signed)
PT Cancellation Note  Patient Details Name: Patrick Roberts MRN: [id number] DOB: 1950/05/16   Cancelled Treatment:    Reason Eval/Treat Not Completed: Other (comment).  PT consult received.  Chart reviewed.  Nursing cleared pt for participation in therapy.  Upon arrival to pt's room, pt sleeping.  Pt woken with vc's and tactile cues.  Difficult to understand pt clearly although pt clear when stating "no" to participating in therapy.  Pt intermittently with eyes open before closing eyes again requiring cueing to open eyes again.  Pt able to squeeze therapists hand with pt's R hand but then B UE repetitive muscle twitching noted for about 1 minute before resolving (nurse notified).  Increased WOB noted (when asleep or awake).  Nurse notified of above.  O2 sats 93% on supplemental O2 via nasal cannula with HR 102 bpm resting in bed.  Will re-attempt PT evaluation at a later date/time.  Leitha Bleak, PT 04/18/18, 3:44 PM [phone]

## 2018-04-18 NOTE — Progress Notes (Signed)
Patient ID: Patrick Roberts, male   DOB: 13-Mar-1950, 68 y.o.   MRN: [id number]  [facility] PROGRESS NOTE  Patrick Roberts WSF:[id number] DOB: 29-Sep-1950 DOA: 04/12/2018 PCP: Minna Merritts, MD  HPI/Subjective: Patient difficult to understand.  Talks very low and sometimes mumbled.  Patient states he feels okay.  No cough.  No abdominal pain.  As per nursing staff not eating very well.  Needs to be fed.  Objective: Vitals:   04/18/18 0630 04/18/18 0942  BP: 104/90 (!) 114/93  Pulse: (!) 112 (!) 110  Resp:  (!) 36  Temp:  98.5 F (36.9 C)  SpO2:  99%    Filed Weights   04/12/18 1846 04/13/18 0030  Weight: 83.9 kg 84.1 kg    ROS: Review of Systems  Constitutional: Negative for chills and fever.  Eyes: Negative for blurred vision.  Respiratory: Negative for cough and shortness of breath.   Cardiovascular: Negative for chest pain.  Gastrointestinal: Negative for abdominal pain, constipation, diarrhea, nausea and vomiting.  Genitourinary: Negative for dysuria.  Musculoskeletal: Negative for joint pain.  Neurological: Negative for dizziness and headaches.   Exam: Physical Exam  HENT:  Nose: No mucosal edema.  Mouth/Throat: No oropharyngeal exudate or posterior oropharyngeal edema.  Eyes: Pupils are equal, round, and reactive to light. Conjunctivae, EOM and lids are normal.  Neck: No JVD present. Carotid bruit is not present. No edema present. No thyroid mass and no thyromegaly present.  Cardiovascular: S1 normal and S2 normal. Exam reveals no gallop.  No murmur heard. Pulses:      Dorsalis pedis pulses are 2+ on the right side and 2+ on the left side.  Respiratory: No respiratory distress. He has no wheezes. He has no rhonchi. He has no rales.  GI: Soft. Bowel sounds are normal. There is no abdominal tenderness.  Musculoskeletal:     Right knee: He exhibits decreased range of motion and swelling.     Right ankle: He exhibits swelling.     Left ankle: He exhibits  swelling.  Lymphadenopathy:    He has no cervical adenopathy.  Neurological: He is alert.  Moves upper extremities on his own.  Positive tremors no upper extremity.  Skin: Skin is warm. No rash noted. Nails show no clubbing.  Small scab over right knee.  Psychiatric: He has a normal mood and affect.      Data Reviewed: Basic Metabolic Panel: Recent Labs  Lab 04/13/18 0402  04/15/18 0827 04/16/18 0340 04/16/18 0557 04/17/18 0516 04/18/18 0403  NA 137   < > 134* 136 137 140 140  K 5.1   < > 3.3* 3.5 3.5 3.3* 3.4*  CL 103   < > 98 101 101 105 105  CO2 14*   < > 26 25 25 26 24   GLUCOSE 86   < > 136* 106* 105* 115* 116*  BUN 36*   < > 21 17 18 17 18   CREATININE 2.54*   < > 1.34* 1.51* 1.30* 1.41* 1.33*  CALCIUM 7.1*   < > 7.9* 7.9* 8.1* 7.8* 8.2*  MG 2.1  --  1.5* 1.8 1.9 1.8 1.7  PHOS 3.6  --   --  1.7* 1.4* 2.5 2.4*   < > = values in this interval not displayed.   Liver Function Tests: Recent Labs  Lab 04/12/18 2000 04/13/18 1309 04/15/18 0827 04/16/18 0340  AST 64*  --  25  --   ALT 37  --  22  --  ALKPHOS 53  --  51  --   BILITOT 1.2  --  1.5*  --   PROT 5.5*  --  5.6*  --   ALBUMIN 3.2* 3.3* 2.8* 3.0*    Recent Labs  Lab 04/16/18 0557  AMMONIA 22   CBC: Recent Labs  Lab 04/13/18 0402 04/14/18 1615 04/15/18 0827 04/16/18 0340 04/17/18 0516  WBC 6.5 4.2 4.1 5.4 4.6  NEUTROABS  --  2.7 2.6 3.1 2.7  HGB 10.8* 9.0* 7.9* 8.0* 7.6*  HCT 32.4* 27.5* 24.0* 24.8* 24.1*  MCV 104.9* 105.8* 104.3* 106.9* 108.6*  PLT 92* 56* 46* 55* 61*   Cardiac Enzymes: Recent Labs  Lab 04/12/18 2000  CKTOTAL 129  TROPONINI <0.03    CBG: Recent Labs  Lab 04/17/18 0804 04/17/18 1659 04/17/18 2201 04/18/18 0743 04/18/18 1209  GLUCAP 98 120* 111* 106* 107*    Recent Results (from the past 240 hour(s))  CULTURE, BLOOD (ROUTINE X 2) w Reflex to ID Panel     Status: None   Collection Time: 04/12/18 10:44 PM  Result Value Ref Range Status   Specimen  Description BLOOD RIGHT WRIST  Final   Special Requests   Final    BOTTLES DRAWN AEROBIC AND ANAEROBIC Blood Culture adequate volume   Culture   Final    NO GROWTH 5 DAYS Performed at [facility], [address] [place], [place] [address]    Report Status 04/17/2018 FINAL  Final  CULTURE, BLOOD (ROUTINE X 2) w Reflex to ID Panel     Status: None   Collection Time: 04/12/18 10:44 PM  Result Value Ref Range Status   Specimen Description BLOOD LEFT ANTECUBITAL  Final   Special Requests   Final    BOTTLES DRAWN AEROBIC AND ANAEROBIC Blood Culture adequate volume   Culture   Final    NO GROWTH 5 DAYS Performed at [facility], [address] [place], [place] [address]    Report Status 04/17/2018 FINAL  Final  MRSA PCR Screening     Status: None   Collection Time: 04/12/18 11:38 PM  Result Value Ref Range Status   MRSA by PCR NEGATIVE NEGATIVE Final    Comment:        The GeneXpert MRSA Assay (FDA approved for NASAL specimens only), is one component of a comprehensive MRSA colonization surveillance program. It is not intended to diagnose MRSA infection nor to guide or monitor treatment for MRSA infections. Performed at [facility], [address] [place], [place] [address]   CULTURE, BLOOD (ROUTINE X 2) w Reflex to ID Panel     Status: None (Preliminary result)   Collection Time: 04/16/18  1:08 PM  Result Value Ref Range Status   Specimen Description BLOOD LEFT WRIST  Final   Special Requests   Final    BOTTLES DRAWN AEROBIC AND ANAEROBIC Blood Culture adequate volume   Culture   Final    NO GROWTH 2 DAYS Performed at [facility], [address], [place], [place] [address]    Report Status PENDING  Incomplete  CULTURE, BLOOD (ROUTINE X 2) w Reflex to ID Panel     Status: None (Preliminary result)   Collection Time: 04/16/18  1:16 PM  Result Value Ref Range Status   Specimen Description BLOOD BLOOD RIGHT HAND  Final   Special  Requests   Final    BOTTLES DRAWN AEROBIC AND ANAEROBIC Blood Culture adequate volume   Culture   Final  NO GROWTH 2 DAYS Performed at [facility], [address] [place], [place] [address]    Report Status PENDING  Incomplete  Respiratory Panel by PCR     Status: None   Collection Time: 04/16/18  1:37 PM  Result Value Ref Range Status   Adenovirus NOT DETECTED NOT DETECTED Final   Coronavirus 229E NOT DETECTED NOT DETECTED Final    Comment: (NOTE) The Coronavirus on the Respiratory Panel, DOES NOT test for the novel  Coronavirus (2019 nCoV)    Coronavirus HKU1 NOT DETECTED NOT DETECTED Final   Coronavirus NL63 NOT DETECTED NOT DETECTED Final   Coronavirus OC43 NOT DETECTED NOT DETECTED Final   Metapneumovirus NOT DETECTED NOT DETECTED Final   Rhinovirus / Enterovirus NOT DETECTED NOT DETECTED Final   Influenza A NOT DETECTED NOT DETECTED Final   Influenza B NOT DETECTED NOT DETECTED Final   Parainfluenza Virus 1 NOT DETECTED NOT DETECTED Final   Parainfluenza Virus 2 NOT DETECTED NOT DETECTED Final   Parainfluenza Virus 3 NOT DETECTED NOT DETECTED Final   Parainfluenza Virus 4 NOT DETECTED NOT DETECTED Final   Respiratory Syncytial Virus NOT DETECTED NOT DETECTED Final   Bordetella pertussis NOT DETECTED NOT DETECTED Final   Chlamydophila pneumoniae NOT DETECTED NOT DETECTED Final   Mycoplasma pneumoniae NOT DETECTED NOT DETECTED Final    Comment: Performed at [facility], [place] [place], [place] [address]  Urine Culture     Status: None   Collection Time: 04/16/18  1:37 PM  Result Value Ref Range Status   Specimen Description   Final    URINE, RANDOM Performed at [facility], [address], [place], [place] [address]    Special Requests   Final    NONE Performed at [facility], [address], [place], [place] [address]    Culture   Final    NO GROWTH Performed at [facility], [place] [address] [place],  [place] [address]    Report Status 04/17/2018 FINAL  Final     Studies: Dg Chest 1 View  Result Date: 04/17/2018 CLINICAL DATA:  Fevers EXAM: CHEST  1 VIEW COMPARISON:  04/16/2018 FINDINGS: Cardiac shadow is within normal limits and stable. Right jugular central line is again seen. Lungs are well aerated bilaterally. Small left pleural effusion is noted. Bibasilar atelectatic changes are seen. No bony abnormality is noted. IMPRESSION: Bibasilar atelectasis and small left pleural effusion. Electronically Signed   By: Inez Catalina M.D.   On: 04/17/2018 08:01   [place] Rt Lower Extrem Ltd Soft Tissue Non Vascular  Result Date: 04/17/2018 CLINICAL DATA:  Joint effusion EXAM: ULTRASOUND RIGHT LOWER EXTREMITY LIMITED TECHNIQUE: Ultrasound examination of the lower extremity soft tissues was performed in the area of clinical concern. COMPARISON:  None. FINDINGS: Large right knee joint effusion with internal echoes concerning for large hemarthrosis. No soft tissue mass, fluid collection or hematoma in the subcutaneous fat. IMPRESSION: Large right hemarthrosis. Electronically Signed   By: Kathreen Devoid   On: 04/17/2018 13:15    Scheduled Meds: . diltiazem  180 mg Oral Daily  . dofetilide  250 mcg Oral Q24H  . LORazepam  2 mg Intravenous TID   Followed by  . LORazepam  1 mg Intravenous TID   Followed by  . [START ON 04/19/2018] LORazepam  1 mg Intravenous BID  . metoprolol succinate  100 mg Oral Daily  . multivitamin with minerals  1 tablet Oral Daily  . [START  ON 04/19/2018] pantoprazole  40 mg Oral Daily  . sacubitril-valsartan  1 tablet Oral BID   Continuous Infusions: . ceFEPime (MAXIPIME) IV 2 g (04/18/18 1057)  . magnesium sulfate 1 - 4 g bolus IVPB    . potassium chloride    . vancomycin 1,500 mg (04/18/18 0829)    Assessment/Plan:  1. Fever unclear cause.  Respiratory panel negative.  Chest x-ray no pneumonia.  Central line removed.  Right knee likely hemarthrosis.  Patient currently on vancomycin and  cefepime.  Fever has settled down. 2. Acute metabolic encephalopathy.  Patient answering questions.  Patient on thiamine folate and multivitamin 3. Acute hypoxic respiratory failure.  Stop IV fluids.  Patient on dysphasia diet.  Try to taper oxygen. 4. Left patellar fracture due to mechanical fall.  Knee immobilizer. 5. Right knee hemarthrosis. 6. Paroxysmal atrial fibrillation holding Xarelto.  On metoprolol diltiazem and Tikosyn 7. Alcohol abuse.  Will need to stop alcohol. 8. Chronic kidney disease stage III.  Continue to monitor. 9. Chronic systolic congestive heart failure.  Hold IV fluids at this time.  Patient on Entresto, metoprolol.  Code Status:     Code Status Orders  (From admission, onward)         Start     Ordered   04/12/18 2231  Full code  Continuous     04/12/18 2230        Code Status History    Date Active Date Inactive Code Status Order ID Comments User Context   08/27/2017 1404 08/30/2017 1727 Full Code [id number]  Charlynn Grimes Inpatient   07/26/2017 1528 07/27/2017 1943 Full Code [id number]  Baldwin [place], PA-C ED     Disposition Plan: To be determined  Consultants:  Infectious disease  Orthopedic surgery  Antibiotics:  Vancomycin  Cefepime  Time spent: 28 minutes  [place]

## 2018-04-18 NOTE — Care Management Important Message (Signed)
Important Message  Patient Details  Name: Patrick Roberts MRN: [id number] Date of Birth: Feb 09, 1951   Medicare Important Message Given:  Yes    Juliann Pulse A Edword Cu 04/18/2018, 1:21 PM

## 2018-04-18 NOTE — Progress Notes (Addendum)
Pharmacy Electrolyte Monitoring Consult:  Pharmacy consulted to assist in monitoring and replacing electrolytes in this 68 y.o. male admitted on 04/12/2018 with Fall and Laceration   Labs:  Sodium (mmol/L)  Date Value  04/18/2018 140  07/10/2017 143   Potassium (mmol/L)  Date Value  04/18/2018 3.4 (L)   Magnesium (mg/dL)  Date Value  04/18/2018 1.7   Phosphorus (mg/dL)  Date Value  04/18/2018 2.4 (L)   Calcium (mg/dL)  Date Value  04/18/2018 8.2 (L)   Albumin (g/dL)  Date Value  04/16/2018 3.0 (L)  01/17/2016 4.6    Assessment/Plan: Patient's Potassium level is 3.4, Magnesium level is 1.8 and Phosphorus is 2.4.  Will order Potassium Chloride 10 mEq IV q1hr x 3 doses.  Ann additional order for IV magnesium sulfate 2 grams once is being added due to the patient being on dofetilide  Will check BMP/Magnesium/Phosphorus with am labs.   Pharmacy will continue to monitor and adjust per consult.   Vallery Sa, PharmD 04/18/2018 8:00 AM

## 2018-04-18 NOTE — Progress Notes (Signed)
Speech Language Pathology Treatment: Dysphagia  Patient Details Name: Patrick Roberts MRN: [id number] DOB: 03-23-1950 Today's Date: 04/18/2018 Time: 1130-1210 SLP Time Calculation (min) (ACUTE ONLY): 40 min  Assessment / Plan / Recommendation Clinical Impression  Pt seen for ongoing assessment of toleration of oral diet; trials to upgrade consistency if appropriate. Oral care given d/t dry lips/mouth. Pt continues to present w/ declined Cognitive/Mental status w/ decreased awareness w/ po intake tasks. Pt appears to present w/ oropharyngeal phase dysphagia likely highly impacted by declined Cognitive status; agitation d/t Acute encephalopathy secondary to alcohol abuse - he is under CIWA precautions and receiving Ativan (sedating affect) per NSG. He is at increased risk for dysphagia and aspiration at this time.  Pt continues to exhibit decreased overall attention and oral awareness during po intake; oral phase deficits c/b increased oral phase time, reduced lingual movements, delayed A-P transit time and initiation of the swallow. Verbal/tactile/visual cues given to encourage pt to swallow and clear fully b/t trials - even use a a lingual sweep and f/u, dry swallow to clear any oral residue. During swallowing, no immediate, overt coughing noted; delayed throat clearing x2 after lengthier oral phase time w/ both puree and Nectar consistency liquids. Suspect delayed pharyngeal swallow initiation d/t Cognitive/mental status at this time. Educated pt on using a throat clear/cough to clear any potential pharyngeal residue between and post trials. No decline in respiratory status during/post po trials given at this session. Pt required full feeding assistance and cues to assist in the feeding - such as when using a straw(cut). Speech continues to be mumbled/muttered somewhat w/ reduced intelligibility - reduced effort given for articulation during verbalizations. Unsure if this is somewhat related to  medications, CIWA.  Recommend continue a Dysphagia level 1 (puree) diet w/ Nectar consistency liquids - only give POs if FULLY awake/alert to safely participate and demonstrate awareness of the task of swallowing; eating/drinking. Recommend strict aspiration precautions; feeding support and supervision at meals; Pills given in Puree - Crushed as needed for safer swallowing, clearing. Pt may need f/u w/ objective swallow assessment prior to diet upgrade. NSG updated.    HPI HPI: Pt is a 68 y.o. male with a known history of alcohol abuse, essential hypertension, atrial fibrillation who is presenting to the emergency room after a fall and laceration to his head.  Patient states that he was drinking earlier and lost balance and fell.  In the emergency room he was noted to be hypothermic, and hypotensive.  He received 4 L of fluid bolus blood pressure still very low.  Patient otherwise is awake and alert denies any chest pain or shortness of breath. Pt is under CIWA precautions receiving Ativan d/t Acute encephalopathy secondary to alcohol abuse.       SLP Plan  Continue with current plan of care(suspect need for objective assessment - TBD)       Recommendations  Diet recommendations: Dysphagia 1 (puree);Nectar-thick liquid Liquids provided via: Teaspoon;Cup;Straw Medication Administration: Crushed with puree(as able for safer swallowing) Supervision: Staff to assist with self feeding;Full supervision/cueing for compensatory strategies Compensations: Minimize environmental distractions;Small sips/bites;Slow rate;Lingual sweep for clearance of pocketing;Multiple dry swallows after each bite/sip;Follow solids with liquid Postural Changes and/or Swallow Maneuvers: Seated upright 90 degrees;Upright 30-60 min after meal                General recommendations: (Dietician f/u) Oral Care Recommendations: Oral care BID;Staff/trained caregiver to provide oral care Follow up Recommendations: Skilled  Nursing facility(TBD) SLP Visit Diagnosis: Dysphagia,  oropharyngeal phase (R13.12)(Cognitive decline) Plan: Continue with current plan of care(suspect need for objective assessment - TBD)       GO                Patrick Kenner, MS, CCC-SLP Patrick,Roberts 04/18/2018, 1:32 PM

## 2018-04-19 DIAGNOSIS — X58XXXA Exposure to other specified factors, initial encounter: Secondary | ICD-10-CM

## 2018-04-19 DIAGNOSIS — M25461 Effusion, right knee: Secondary | ICD-10-CM

## 2018-04-19 DIAGNOSIS — R410 Disorientation, unspecified: Secondary | ICD-10-CM

## 2018-04-19 LAB — BASIC METABOLIC PANEL
Anion gap: 8 (ref 5–15)
BUN: 21 mg/dL (ref 8–23)
CO2: 24 mmol/L (ref 22–32)
Calcium: 8.2 mg/dL — ABNORMAL LOW (ref 8.9–10.3)
Chloride: 111 mmol/L (ref 98–111)
Creatinine, Ser: 1.28 mg/dL — ABNORMAL HIGH (ref 0.61–1.24)
GFR calc Af Amer: >60 mL/min (ref >60)
GFR calc non Af Amer: 57 mL/min — ABNORMAL LOW (ref >60)
Glucose, Bld: 130 mg/dL — ABNORMAL HIGH (ref 70–99)
Potassium: 4.1 mmol/L (ref 3.5–5.1)
Sodium: 143 mmol/L (ref 135–145)

## 2018-04-19 LAB — GLUCOSE, CAPILLARY
Glucose-Capillary: 112 mg/dL — ABNORMAL HIGH (ref 70–99)
Glucose-Capillary: 105 mg/dL — ABNORMAL HIGH (ref 70–99)
Glucose-Capillary: 114 mg/dL — ABNORMAL HIGH (ref 70–99)
Glucose-Capillary: 130 mg/dL — ABNORMAL HIGH (ref 70–99)

## 2018-04-19 LAB — PHOSPHORUS: PHOSPHORUS: 2.2 mg/dL — AB (ref 2.5–4.6)

## 2018-04-19 LAB — MAGNESIUM: Magnesium: 2.1 mg/dL (ref 1.7–2.4)

## 2018-04-19 MED ORDER — POTASSIUM PHOSPHATE MONOBASIC 500 MG PO TABS
500.0000 mg | ORAL_TABLET | Freq: Three times a day (TID) | ORAL | Status: AC
  Administered 2018-04-19 (×2): 500 mg via ORAL
  Filled 2018-04-19 (×2): qty 1

## 2018-04-19 MED ORDER — SODIUM CHLORIDE 0.9 % IV SOLN
INTRAVENOUS | Status: DC
  Administered 2018-04-19: 16:00:00 via INTRAVENOUS

## 2018-04-19 NOTE — Evaluation (Signed)
Physical Therapy Evaluation Patient Details Name: Patrick Roberts MRN: [id number] DOB: 27-Feb-1950 Today's Date: 04/19/2018   History of Present Illness  Pt is a 68 y.o. male presenting to hospital 04/12/18 s/p fall with head laceration.  Imaging showing L parietal scalp hematoma; also non-displaced sagittal fx through midportion of patella L.  Also large R knee hematoma vs hemarthrosis.  Pt admitted with acute encephalopathy secondary alcohol abuse, acute hypoxic respiratory failure, L patellar fx, paroxysmal a-fib, and macrocytic anemia; pt also noted with fevers.  PMH includes alcohol abuse, CHF, htn, a-fib, CKD stage 3.  Clinical Impression  Prior to hospital admission, pt reports being independent with functional mobility.  Pt oriented to self only during morning session and reporting waiting for ride to go home.  After logrolling pt in bed in attempted morning functional mobility session, nursing reported needing to get pt cleaned up before doing anything else so therapist came back and performed co-treat with OT during afternoon session.  Pt reports living with his wife in multi-story home but wife is only home on weekends.  Currently pt is 2 assist semi-supine to/from sit but able to maintain sitting balance edge of bed for about 10 minutes with CGA to close SBA.  Unable to stand pt with 2 assist, use of RW, bed height elevated, and max cueing.  Pt denying any pain during session.  Pt appearing confused and had difficulty with hand placement and following directions.  Pt would benefit from skilled PT to address noted impairments and functional limitations (see below for any additional details).  Upon hospital discharge, recommend pt discharge to [facility].    Follow Up Recommendations SNF    Equipment Recommendations  Rolling walker with 5" wheels;Wheelchair (measurements PT);Wheelchair cushion (measurements PT);3in1 (PT)    Recommendations for Other Services OT consult     Precautions /  Restrictions Precautions Precautions: Fall Precaution Comments: Aspiration Required Braces or Orthoses: Knee Immobilizer - Left Knee Immobilizer - Left: On at all times Restrictions Weight Bearing Restrictions: Yes LLE Weight Bearing: Weight bearing as tolerated Other Position/Activity Restrictions: Avoid flexion of L knee to avoid displacement to L patellar fx; per ortho MD note, pt WBAT as pain allows in immobilizer      Mobility  Bed Mobility Overal bed mobility: Needs Assistance Bed Mobility: Rolling;Supine to Sit;Sit to Supine Rolling: +2 for physical assistance   Supine to sit: Mod assist;Max assist;+2 for physical assistance Sit to supine: Mod assist;Max assist;+2 for physical assistance   General bed mobility comments: assist to initiate and logroll to R side; assist for L LE for logrolling; 2 assist semi-supine to/from sit and to scoot up in bed  Transfers Overall transfer level: Needs assistance Equipment used: Rolling walker (2 wheeled) Transfers: Sit to/from Stand Sit to Stand: Total assist;From elevated surface         General transfer comment: unable to stand pt with 2 assist and vc's/tactile cues for technique  Ambulation/Gait             General Gait Details: not safe at this time  Stairs            Wheelchair Mobility    Modified Rankin (Stroke Patients Only)       Balance Overall balance assessment: Needs assistance Sitting-balance support: Bilateral upper extremity supported;Feet supported Sitting balance-Leahy Scale: Poor Sitting balance - Comments: pt requiring at least single UE support for static sitting balance  Pertinent Vitals/Pain  Vitals (HR and O2 on room air) stable and WFL throughout treatment session.    Home Living Family/patient expects to be discharged to:: Private residence Living Arrangements: Spouse/significant other Available Help at Discharge: Family Type  of Home: House Home Access: Stairs to enter Entrance Stairs-Rails: Right;Left;Can reach both Technical brewer of Steps: 3 Home Layout: Multi-level Home Equipment: None Additional Comments: Wife is home on weekends (takes care of grandchildren during the week)    Prior Function Level of Independence: Independent               Hand Dominance        Extremity/Trunk Assessment   Upper Extremity Assessment Upper Extremity Assessment: Defer to OT evaluation(at least 3/5 B elbow flexion/extension; B fair hand grip strength; shoulder flexion 2-/5 B (AAROM to 90 degrees))    Lower Extremity Assessment Lower Extremity Assessment: Generalized weakness;RLE deficits/detail;LLE deficits/detail RLE Deficits / Details: at least 3/5 DF/PF AROM; at least 3-/5 hip flexion; fair quad set strength; DF grossly 5 degrees past neutral LLE Deficits / Details: at least 3/5 DF/PF AROM; DF to neutral; rest of L LE deferred (in knee immobilizer) LLE: Unable to fully assess due to immobilization    Cervical / Trunk Assessment Cervical / Trunk Assessment: Normal  Communication   Communication: Other (comment)(Difficult to understand pt intermittently)  Cognition Arousal/Alertness: Awake/alert Behavior During Therapy: Flat affect Overall Cognitive Status: (Oriented to person and place but not time or situation)                                        General Comments General comments (skin integrity, edema, etc.): L LE KI in place.  Wound noted L anterior knee area under knee immobilizer (nurse notified and came to assess and address: MD Wieting also notified verbally by therapist).  Nursing cleared pt for participation in physical therapy.  Pt agreeable to PT.    Exercises     Assessment/Plan    PT Assessment Patient needs continued PT services  PT Problem List Decreased strength;Decreased range of motion;Decreased activity tolerance;Decreased balance;Decreased  mobility;Decreased cognition;Decreased knowledge of use of DME;Decreased safety awareness;Decreased knowledge of precautions;Pain;Decreased skin integrity       PT Treatment Interventions DME instruction;Gait training;Stair training;Functional mobility training;Therapeutic activities;Therapeutic exercise;Balance training;Patient/family education    PT Goals (Current goals can be found in the Care Plan section)  Acute Rehab PT Goals Patient Stated Goal: to improve mobility PT Goal Formulation: With patient Time For Goal Achievement: 05/03/18 Potential to Achieve Goals: Fair    Frequency 7X/week   Barriers to discharge Decreased caregiver support      Co-evaluation               AM-PAC PT "6 Clicks" Mobility  Outcome Measure Help needed turning from your back to your side while in a flat bed without using bedrails?: Total Help needed moving from lying on your back to sitting on the side of a flat bed without using bedrails?: Total Help needed moving to and from a bed to a chair (including a wheelchair)?: Total Help needed standing up from a chair using your arms (e.g., wheelchair or bedside chair)?: Total Help needed to walk in hospital room?: Total Help needed climbing 3-5 steps with a railing? : Total 6 Click Score: 6    End of Session Equipment Utilized During Treatment: Gait belt;Left knee immobilizer;Oxygen Activity Tolerance: Patient tolerated  treatment well Patient left: in bed;with call bell/phone within reach;with bed alarm set;with nursing/sitter in room;with SCD's reapplied;Other (comment)(B prevalon boots in place; L KI in place; bed in lowest position) Nurse Communication: Mobility status;Precautions;Weight bearing status PT Visit Diagnosis: Other abnormalities of gait and mobility (R26.89);Muscle weakness (generalized) (M62.81);History of falling (Z91.81);Difficulty in walking, not elsewhere classified (R26.2)    Time: 1100-1127(also 4562-5638) PT Time  Calculation (min) (ACUTE ONLY): 27 min   Charges:   PT Evaluation $PT Eval Low Complexity: 1 Low PT Treatments $Therapeutic Activity: 23-37 mins       Leitha Bleak, PT 04/19/18, 5:21 PM [phone]

## 2018-04-19 NOTE — Progress Notes (Signed)
  Date of Admission:  04/12/2018  Today 04/19/18     ID: Patrick Roberts is a 68 y.o. male   Active Problems:   Hypotension Alcohol intoxication Encephalopathy due to alcohol Fever  Subjective: Afebrile Says he is feeling better Still unintelligible  Medications:  . diltiazem  180 mg Oral Daily  . dofetilide  250 mcg Oral Q24H  . folic acid  1 mg Oral Daily  . LORazepam  1 mg Oral TID   Followed by  . [START ON 04/20/2018] LORazepam  1 mg Oral BID  . metoprolol succinate  100 mg Oral Daily  . multivitamin with minerals  1 tablet Oral Daily  . pantoprazole  40 mg Oral Daily  . sacubitril-valsartan  1 tablet Oral BID  . thiamine  100 mg Oral Daily    Objective: Vital signs in last 24 hours: Temp:  [97.7 F (36.5 C)-99.1 F (37.3 C)] 99.1 F (37.3 C) (03/06 0802) Pulse Rate:  [92-103] 95 (03/06 0802) Resp:  [17-20] 18 (03/06 0802) BP: (107-117)/(82-99) 117/89 (03/06 0802) SpO2:  [98 %-100 %] 98 % (03/06 0802)  PHYSICAL EXAM:  General: more Alert, cooperative, no distress, tremulous, knows the year, president ,    Lungs: b/l air entryHeart: Regular rate and rhythm, no murmur, rub or gallop. Abdomen: Soft, non-tender,not distended. Bowel sounds normal. No masses Extremities: rt knee swelling- frim effusion Left knee patellar fracture with brusing Neurologic: moves all limbs  Lab Results Recent Labs    04/17/18 0516 04/18/18 0403 04/19/18 0513  WBC 4.6  --   --   HGB 7.6*  --   --   HCT 24.1*  --   --   NA 140 140 143  K 3.3* 3.4* 4.1  CL 105 105 111  CO2 26 24 24   BUN 17 18 21   CREATININE 1.41* 1.33* 1.28*   Liver Panel No results for input(s): PROT, ALBUMIN, AST, ALT, ALKPHOS, BILITOT, BILIDIR, IBILI in the last 72 hours. Sedimentation Rate No results for input(s): ESRSEDRATE in the last 72 hours. C-Reactive Protein No results for input(s): CRP in the last 72 hours.  Microbiology: [facility] from 04/16/18 Neg  Studies/Results: [place] Rt Lower Extrem Ltd Soft  Tissue Non Vascular  Result Date: 04/17/2018 CLINICAL DATA:  Joint effusion EXAM: ULTRASOUND RIGHT LOWER EXTREMITY LIMITED TECHNIQUE: Ultrasound examination of the lower extremity soft tissues was performed in the area of clinical concern. COMPARISON:  None. FINDINGS: Large right knee joint effusion with internal echoes concerning for large hemarthrosis. No soft tissue mass, fluid collection or hematoma in the subcutaneous fat. IMPRESSION: Large right hemarthrosis. Electronically Signed   By: Kathreen Devoid   On: 04/17/2018 13:15     Assessment/Plan: 67 y.o. male with a history of Afib, HTN, alcohol abuse was brought to the hospital on 2/28 as he was found on the floor   Fever -has resolved - afebrile X 3 days Blood culture neg so far  D.D could be from central line, which has been removed  Could be from hemarthrosis of the knee joints UC neg   If blood culture remains neg will DC vanco and cefepime on Sunday ? Fracture left patella  Afib ( paroxysmal)  Discussed with Dr.Weiting ID will sign off- call if needed

## 2018-04-19 NOTE — Evaluation (Signed)
Occupational Therapy Evaluation Patient Details Name: Patrick Roberts MRN: [id number] DOB: October 07, 1950 Today's Date: 04/19/2018    History of Present Illness Pt is a 68 y.o. male presenting to hospital 04/12/18 s/p fall with head laceration.  Imaging showing L parietal scalp hematoma; also non-displaced sagittal fx through midportion of patella L.  Also large R knee hematoma vs hemarthrosis.  Pt admitted with acute encephalopathy secondary alcohol abuse, acute hypoxic respiratory failure, L patellar fx, paroxysmal a-fib, and macrocytic anemia; pt also noted with fevers.  PMH includes alcohol abuse, CHF, htn, a-fib, CKD stage 3.   Clinical Impression   Pt seen for OT evaluation this date. Prior to hospital admission, pt was independent. Pt endorses "a few" falls but unable to verbalize details. Pt lives with his spouse in a multi story home with 3 steps to enter. Pt oriented to self, DOB, and situation ("I'm here because I fell") but unable to describe where he was or date/time. Pt intermittently confused and required cues throughout session for safety and sequencing. Currently pt demonstrates impairments in cognition, strength, activity tolerance, ROM, and balance requiring mod-max assist for ADL and total assist +2 for functional mobility attempts. Pt would benefit from skilled OT to address noted impairments and functional limitations (see below for any additional details) in order to maximize safety and independence while minimizing falls risk and caregiver burden.  Upon hospital discharge, recommend pt discharge to [facility].    Follow Up Recommendations  SNF    Equipment Recommendations  Other (comment)(TBD)    Recommendations for Other Services       Precautions / Restrictions Precautions Precautions: Fall Precaution Comments: Aspiration Required Braces or Orthoses: Knee Immobilizer - Left Knee Immobilizer - Left: On at all times Restrictions Weight Bearing Restrictions: Yes LLE Weight  Bearing: Weight bearing as tolerated Other Position/Activity Restrictions: Avoid flexion of L knee to avoid displacement to L patellar fx; per ortho MD note, pt WBAT as pain allows in immobilizer      Mobility Bed Mobility Overal bed mobility: Needs Assistance Bed Mobility: Rolling;Supine to Sit;Sit to Supine Rolling: +2 for physical assistance   Supine to sit: Mod assist;Max assist;+2 for physical assistance Sit to supine: Mod assist;Max assist;+2 for physical assistance   General bed mobility comments: assist to initiate and logroll to R side; assist for L LE for logrolling; 2 assist semi-supine to/from sit and to scoot up in bed  Transfers Overall transfer level: Needs assistance Equipment used: Rolling walker (2 wheeled) Transfers: Sit to/from Stand Sit to Stand: Total assist;From elevated surface         General transfer comment: unable to stand pt with 2 assist and vc's/tactile cues for technique    Balance Overall balance assessment: Needs assistance Sitting-balance support: Bilateral upper extremity supported;Feet supported Sitting balance-Leahy Scale: Poor Sitting balance - Comments: pt requiring at least single UE support for static sitting balance                                   ADL either performed or assessed with clinical judgement   ADL Overall ADL's : Needs assistance/impaired   Eating/Feeding Details (indicate cue type and reason): aspiration precautions Grooming: Bed level;Wash/dry face;Moderate assistance Grooming Details (indicate cue type and reason): difficulty raising arms enough to wash above his jaw line Upper Body Bathing: Sitting;Moderate assistance   Lower Body Bathing: Sitting/lateral leans;Maximal assistance   Upper Body Dressing : Sitting;Moderate assistance  Lower Body Dressing: Sitting/lateral leans;Maximal assistance     Toilet Transfer Details (indicate cue type and reason): unable                 Vision  Baseline Vision/History: Wears glasses Wears Glasses: At all times Patient Visual Report: No change from baseline Vision Assessment?: No apparent visual deficits     Perception     Praxis      Pertinent Vitals/Pain Pain Assessment: No/denies pain     Hand Dominance Left   Extremity/Trunk Assessment Upper Extremity Assessment Upper Extremity Assessment: Generalized weakness(shoulder flexion 3/5 (AAROM full ROM), grossly 4/5 otherwise)   Lower Extremity Assessment Lower Extremity Assessment: Defer to PT evaluation;Generalized weakness;RLE deficits/detail;LLE deficits/detail RLE Deficits / Details: at least 3/5 DF/PF AROM; at least 3-/5 hip flexion; fair quad set strength; DF grossly 5 degrees past neutral LLE Deficits / Details: at least 3/5 DF/PF AROM; DF to neutral; rest of L LE deferred (in knee immobilizer) LLE: Unable to fully assess due to immobilization   Cervical / Trunk Assessment Cervical / Trunk Assessment: Normal   Communication Communication Communication: (Difficult to understand pt intermittently)   Cognition Arousal/Alertness: Awake/alert(initially lethargic) Behavior During Therapy: Flat affect Overall Cognitive Status: (Oriented to person and place but not time or situation)                                 General Comments: oriented to person, situation (here bc I fell) but not place or date/time; intermittently confused; requires mod verbal cues to follow multistep commands   General Comments  LLE KI in place, SCD to RLE, prevalon boots    Exercises Other Exercises Other Exercises: pt tolerated EOB for approx 10 minutes for application of lotion to back for skin protection - total assist to apply   Shoulder Instructions      Home Living Family/patient expects to be discharged to:: Private residence Living Arrangements: Spouse/significant other Available Help at Discharge: Family Type of Home: House Home Access: Stairs to  enter Technical brewer of Steps: 3 Entrance Stairs-Rails: Right;Left;Can reach both Home Layout: Multi-level Alternate Level Stairs-Number of Steps: flight Alternate Level Stairs-Rails: Left           Home Equipment: None   Additional Comments: Wife is home on weekends (takes care of grandchildren during the week)      Prior Functioning/Environment Level of Independence: Independent        Comments: per chart review; pt endorses "a few" falls        OT Problem List: Decreased strength;Decreased range of motion;Decreased knowledge of use of DME or AE;Decreased knowledge of precautions;Decreased activity tolerance;Decreased cognition;Impaired UE functional use;Decreased safety awareness;Impaired balance (sitting and/or standing)      OT Treatment/Interventions: Self-care/ADL training;Balance training;Therapeutic exercise;Cognitive remediation/compensation;Therapeutic activities;Energy conservation;DME and/or AE instruction;Patient/family education    OT Goals(Current goals can be found in the care plan section) Acute Rehab OT Goals Patient Stated Goal: to get stronger OT Goal Formulation: With patient Time For Goal Achievement: 05/03/18 Potential to Achieve Goals: Good  OT Frequency: Min 2X/week   Barriers to D/C:            Co-evaluation PT/OT/SLP Co-Evaluation/Treatment: Yes Reason for Co-Treatment: Complexity of the patient's impairments (multi-system involvement);Necessary to address cognition/behavior during functional activity;For patient/therapist safety;To address functional/ADL transfers PT goals addressed during session: Mobility/safety with mobility;Balance;Proper use of DME;Strengthening/ROM OT goals addressed during session: ADL's and self-care;Proper use of Adaptive equipment and  DME;Strengthening/ROM      AM-PAC OT "6 Clicks" Daily Activity     Outcome Measure Help from another person eating meals?: A Lot Help from another person taking care of  personal grooming?: A Lot Help from another person toileting, which includes using toliet, bedpan, or urinal?: Total Help from another person bathing (including washing, rinsing, drying)?: A Lot Help from another person to put on and taking off regular upper body clothing?: A Lot Help from another person to put on and taking off regular lower body clothing?: A Lot 6 Click Score: 11   End of Session Equipment Utilized During Treatment: Gait belt;Rolling walker Nurse Communication: Mobility status  Activity Tolerance: Patient tolerated treatment well Patient left: in bed;with call bell/phone within reach;with bed alarm set;with SCD's reapplied;Other (comment)(KI on LLE, SCD on RLE, prevalon boots on)  OT Visit Diagnosis: Other abnormalities of gait and mobility (R26.89);Repeated falls (R29.6);Muscle weakness (generalized) (M62.81);Other symptoms and signs involving cognitive function                Time: 5284-1324 OT Time Calculation (min): 43 min Charges:  OT General Charges $OT Visit: 1 Visit OT Evaluation $OT Eval Moderate Complexity: 1 Mod OT Treatments $Therapeutic Activity: 8-22 mins  Jeni Salles, MPH, MS, OTR/L ascom [phone] 04/19/18, 5:27 PM

## 2018-04-19 NOTE — Progress Notes (Signed)
Patient yelling from room. This RN entered room to find patient  Sitting up in bed with left leg between two side rails. He was reaching towards end of bed asking about dinner and appetizers and saying he needed to leave. Unable to convince patient he was in the hospital and there was no food at the end of the bed. Patient was resistant to lay down in bed and put his leg back up on the bed. He stated "no I am not going to do that". He said he needed to go home he had been discharged and needed to move his car. Multiple attempts were made to reorient patient and use distraction, however he was adamat about his wife coming to get him and he needing to leave. Patient given PRN dose of ativan IV. Second nurse assisted to reposition patient in bed. This RN stayed at bedside to talk with patient until he had calmed down. When RN left patient he was resting in bed with eyes closed.

## 2018-04-19 NOTE — Progress Notes (Signed)
Pharmacy Electrolyte Monitoring Consult:  Pharmacy consulted to assist in monitoring and replacing electrolytes in this 68 y.o. male admitted on 04/12/2018 with Fall and Laceration   Labs:  Sodium (mmol/L)  Date Value  04/19/2018 143  07/10/2017 143   Potassium (mmol/L)  Date Value  04/19/2018 4.1   Magnesium (mg/dL)  Date Value  04/19/2018 2.1   Phosphorus (mg/dL)  Date Value  04/19/2018 2.2 (L)   Calcium (mg/dL)  Date Value  04/19/2018 8.2 (L)   Albumin (g/dL)  Date Value  04/16/2018 3.0 (L)  01/17/2016 4.6    Assessment/Plan: Patient's Potassium level is 4.1 and Magnesium level is 2.1.  Current electrolytes being monitored are WNL. No correction doses are needed.  Will recheck levels with am labs.   Pharmacy will continue to monitor and adjust per consult.   Rito Ehrlich, PharmD 04/19/2018 8:17 AM

## 2018-04-19 NOTE — Progress Notes (Signed)
Patient ID: Patrick Roberts, male   DOB: [birth year], 68 y.o.   MRN: [id number]  [facility] PROGRESS NOTE  COBURN KNAUS JSH:[id number] DOB: 02/10/1951 DOA: 04/12/2018 PCP: Minna Merritts, MD  HPI/Subjective: When I went in the room, I gave the patient a sternal rub and the open his eyes and then went back to sleep.  Apparently was given some Ativan this morning.  Objective: Vitals:   04/19/18 0008 04/19/18 0802  BP: 109/82 117/89  Pulse: 92 95  Resp: 17 18  Temp: 98.2 F (36.8 C) 99.1 F (37.3 C)  SpO2: 100% 98%    Filed Weights   04/12/18 1846 04/13/18 0030  Weight: 83.9 kg 84.1 kg    ROS: Review of Systems  Unable to perform ROS: Acuity of condition   Exam: Physical Exam  Constitutional: He appears lethargic.  HENT:  Nose: No mucosal edema.  Mouth/Throat: No oropharyngeal exudate or posterior oropharyngeal edema.  Eyes: Pupils are equal, round, and reactive to light. Conjunctivae, EOM and lids are normal.  Neck: No JVD present. Carotid bruit is not present. No edema present. No thyroid mass and no thyromegaly present.  Cardiovascular: S1 normal and S2 normal. Exam reveals no gallop.  No murmur heard. Respiratory: No respiratory distress. He has no wheezes. He has no rhonchi. He has no rales.  GI: Soft. Bowel sounds are normal. There is no abdominal tenderness.  Musculoskeletal:     Right knee: He exhibits decreased range of motion and swelling.     Right ankle: He exhibits swelling.     Left ankle: He exhibits swelling.  Lymphadenopathy:    He has no cervical adenopathy.  Neurological: He appears lethargic.  Skin: Skin is warm. Nails show no clubbing.  Small scab over right knee.  Nurse noted an area on the heel.  Physical therapist noted a area near the left knee where the brace was on.  Psychiatric:  Opened eyes to sternal rub      Data Reviewed: Basic Metabolic Panel: Recent Labs  Lab 04/16/18 0340 04/16/18 0557 04/17/18 0516 04/18/18 0403  04/19/18 0513  NA 136 137 140 140 143  K 3.5 3.5 3.3* 3.4* 4.1  CL 101 101 105 105 111  CO2 25 25 26 24 24   GLUCOSE 106* 105* 115* 116* 130*  BUN 17 18 17 18 21   CREATININE 1.51* 1.30* 1.41* 1.33* 1.28*  CALCIUM 7.9* 8.1* 7.8* 8.2* 8.2*  MG 1.8 1.9 1.8 1.7 2.1  PHOS 1.7* 1.4* 2.5 2.4* 2.2*   Liver Function Tests: Recent Labs  Lab 04/12/18 2000 04/13/18 1309 04/15/18 0827 04/16/18 0340  AST 64*  --  25  --   ALT 37  --  22  --   ALKPHOS 53  --  51  --   BILITOT 1.2  --  1.5*  --   PROT 5.5*  --  5.6*  --   ALBUMIN 3.2* 3.3* 2.8* 3.0*    Recent Labs  Lab 04/16/18 0557  AMMONIA 22   CBC: Recent Labs  Lab 04/13/18 0402 04/14/18 1615 04/15/18 0827 04/16/18 0340 04/17/18 0516  WBC 6.5 4.2 4.1 5.4 4.6  NEUTROABS  --  2.7 2.6 3.1 2.7  HGB 10.8* 9.0* 7.9* 8.0* 7.6*  HCT 32.4* 27.5* 24.0* 24.8* 24.1*  MCV 104.9* 105.8* 104.3* 106.9* 108.6*  PLT 92* 56* 46* 55* 61*   Cardiac Enzymes: Recent Labs  Lab 04/12/18 2000  CKTOTAL 129  TROPONINI <0.03    CBG: Recent Labs  Lab 04/18/18 1209 04/18/18 1701 04/18/18 2122 04/19/18 0803 04/19/18 1206  GLUCAP 107* 145* 127* 112* 105*    Recent Results (from the past 240 hour(s))  CULTURE, BLOOD (ROUTINE X 2) w Reflex to ID Panel     Status: None   Collection Time: 04/12/18 10:44 PM  Result Value Ref Range Status   Specimen Description BLOOD RIGHT WRIST  Final   Special Requests   Final    BOTTLES DRAWN AEROBIC AND ANAEROBIC Blood Culture adequate volume   Culture   Final    NO GROWTH 5 DAYS Performed at [facility], [address] [place], [place] [address]    Report Status 04/17/2018 FINAL  Final  CULTURE, BLOOD (ROUTINE X 2) w Reflex to ID Panel     Status: None   Collection Time: 04/12/18 10:44 PM  Result Value Ref Range Status   Specimen Description BLOOD LEFT ANTECUBITAL  Final   Special Requests   Final    BOTTLES DRAWN AEROBIC AND ANAEROBIC Blood Culture adequate volume   Culture   Final     NO GROWTH 5 DAYS Performed at [facility], [address] [place], [place] [address]    Report Status 04/17/2018 FINAL  Final  MRSA PCR Screening     Status: None   Collection Time: 04/12/18 11:38 PM  Result Value Ref Range Status   MRSA by PCR NEGATIVE NEGATIVE Final    Comment:        The GeneXpert MRSA Assay (FDA approved for NASAL specimens only), is one component of a comprehensive MRSA colonization surveillance program. It is not intended to diagnose MRSA infection nor to guide or monitor treatment for MRSA infections. Performed at [facility], [address] [place], [place] [address]   CULTURE, BLOOD (ROUTINE X 2) w Reflex to ID Panel     Status: None (Preliminary result)   Collection Time: 04/16/18  1:08 PM  Result Value Ref Range Status   Specimen Description BLOOD LEFT WRIST  Final   Special Requests   Final    BOTTLES DRAWN AEROBIC AND ANAEROBIC Blood Culture adequate volume   Culture   Final    NO GROWTH 3 DAYS Performed at [facility], [address] [place], [place] [address]    Report Status PENDING  Incomplete  CULTURE, BLOOD (ROUTINE X 2) w Reflex to ID Panel     Status: None (Preliminary result)   Collection Time: 04/16/18  1:16 PM  Result Value Ref Range Status   Specimen Description BLOOD BLOOD RIGHT HAND  Final   Special Requests   Final    BOTTLES DRAWN AEROBIC AND ANAEROBIC Blood Culture adequate volume   Culture   Final    NO GROWTH 3 DAYS Performed at [facility], [address] [place], [place] [address]    Report Status PENDING  Incomplete  Respiratory Panel by PCR     Status: None   Collection Time: 04/16/18  1:37 PM  Result Value Ref Range Status   Adenovirus NOT DETECTED NOT DETECTED Final   Coronavirus 229E NOT DETECTED NOT DETECTED Final    Comment: (NOTE) The Coronavirus on the Respiratory Panel, DOES NOT test for the novel  Coronavirus (2019 nCoV)    Coronavirus HKU1 NOT DETECTED  NOT DETECTED Final   Coronavirus NL63 NOT DETECTED NOT DETECTED Final   Coronavirus OC43 NOT DETECTED NOT DETECTED Final   Metapneumovirus NOT DETECTED NOT DETECTED Final   Rhinovirus / Enterovirus NOT  DETECTED NOT DETECTED Final   Influenza A NOT DETECTED NOT DETECTED Final   Influenza B NOT DETECTED NOT DETECTED Final   Parainfluenza Virus 1 NOT DETECTED NOT DETECTED Final   Parainfluenza Virus 2 NOT DETECTED NOT DETECTED Final   Parainfluenza Virus 3 NOT DETECTED NOT DETECTED Final   Parainfluenza Virus 4 NOT DETECTED NOT DETECTED Final   Respiratory Syncytial Virus NOT DETECTED NOT DETECTED Final   Bordetella pertussis NOT DETECTED NOT DETECTED Final   Chlamydophila pneumoniae NOT DETECTED NOT DETECTED Final   Mycoplasma pneumoniae NOT DETECTED NOT DETECTED Final    Comment: Performed at [facility], [place] [address], [place], [place] [address]  Urine Culture     Status: None   Collection Time: 04/16/18  1:37 PM  Result Value Ref Range Status   Specimen Description   Final    URINE, RANDOM Performed at [facility], [address] [place], [place] [address]    Special Requests   Final    NONE Performed at [facility], [address], [place], [place] [address]    Culture   Final    NO GROWTH Performed at [facility], [place] [address] [place], [place] [address]    Report Status 04/17/2018 FINAL  Final      Scheduled Meds: . diltiazem  180 mg Oral Daily  . dofetilide  250 mcg Oral Q24H  . folic acid  1 mg Oral Daily  . metoprolol succinate  100 mg Oral Daily  . multivitamin with minerals  1 tablet Oral Daily  . pantoprazole  40 mg Oral Daily  . potassium phosphate (monobasic)  500 mg Oral TID WC & HS  . sacubitril-valsartan  1 tablet Oral BID  . thiamine  100 mg Oral Daily   Continuous Infusions: . ceFEPime (MAXIPIME) IV 2 g (04/19/18 0831)  . vancomycin 1,500 mg (04/19/18 1032)    Assessment/Plan:  1. Fever unclear cause.   Respiratory panel negative.  Chest x-ray no pneumonia.  Central line removed.  Right knee likely hemarthrosis.  Patient currently on vancomycin and cefepime.  Temperature is 99. 2. Acute metabolic encephalopathy.  We will stop Ativan at this point. 3. Acute hypoxic respiratory failure.   Patient on dysphasia diet.   4. Left patellar fracture due to mechanical fall.  Knee immobilizer. 5. Right knee hemarthrosis. 6. Paroxysmal atrial fibrillation holding Xarelto.  On metoprolol diltiazem and Tikosyn 7. Alcohol abuse.  Will need to stop alcohol. 8. Chronic kidney disease stage III.  Continue to monitor. 9. Chronic systolic congestive heart failure.  Patient on Entresto, metoprolol.  Code Status:     Code Status Orders  (From admission, onward)         Start     Ordered   04/12/18 2231  Full code  Continuous     04/12/18 2230        Code Status History    Date Active Date Inactive Code Status Order ID Comments User Context   08/27/2017 1404 08/30/2017 1727 Full Code [id number]  Charlynn Grimes Inpatient   07/26/2017 1528 07/27/2017 1943 Full Code [id number]  Baldwin Jamaica, PA-C ED     Disposition Plan: To be determined  Consultants:  Infectious disease  Orthopedic surgery  Antibiotics:  Vancomycin  Cefepime  Time spent: 30 minutes. Spoke with wife on the phone.  Pura Picinich [employer]

## 2018-04-19 NOTE — Progress Notes (Signed)
Pharmacy Antibiotic Note  RAINE ELSASS is a 68 y.o. male admitted on 04/12/2018 with pneumonia.  Patient has been recently diagnosed with sepsis. Pharmacy has been consulted for vanc/cefepime dosing. Serum Creatinine has improved from 1.41 to 1.28. Last WBC was WNL. CBC has been ordered for 03/07.  Plan:  Continue Vancomycin 1500 mg IV q24 hrs. Goal AUC 400-550. Expected AUC: 440.7 SCr used: 1.28 mg/dL ---Scheduled Peak level for 04/21/18 at 1200 ---Scheduled Trough level for 04/22/18 at 0700  Continue Cefepime 2 g IV q12 hrs per CrCl of 62.4 ml/min  Height: 6\' 1"  (185.4 cm) Weight: 185 lb 6.5 oz (84.1 kg) IBW/kg (Calculated) : 79.9  Temp (24hrs), Avg:98.5 F (36.9 C), Min:97.7 F (36.5 C), Max:99.1 F (37.3 C)  Recent Labs  Lab 04/12/18 2057 04/12/18 2244 04/13/18 0402  04/14/18 1615 04/15/18 0827 04/16/18 0340 04/16/18 0557 04/16/18 1308 04/17/18 0516 04/18/18 0403 04/19/18 0513  WBC  --   --  6.5  --  4.2 4.1 5.4  --   --  4.6  --   --   CREATININE  --   --  2.54*   < >  --  1.34* 1.51* 1.30*  --  1.41* 1.33* 1.28*  LATICACIDVEN 3.6* 3.0*  --   --   --   --   --   --  1.2  --   --   --    < > = values in this interval not displayed.    Estimated Creatinine Clearance: 62.4 mL/min (A) (by C-G formula based on SCr of 1.28 mg/dL (H)).    No Known Allergies  Thank you for allowing pharmacy to be a part of this patient's care.  Tobie Lords, PharmD, BCPS Clinical Pharmacist 04/19/2018

## 2018-04-20 LAB — GASTROINTESTINAL PANEL BY PCR, STOOL (REPLACES STOOL CULTURE)
Adenovirus F40/41: NOT DETECTED
Astrovirus: NOT DETECTED
Campylobacter species: NOT DETECTED
Cryptosporidium: NOT DETECTED
Cyclospora cayetanensis: NOT DETECTED
Entamoeba histolytica: NOT DETECTED
Enteroaggregative E coli (EAEC): NOT DETECTED
Enteropathogenic E coli (EPEC): NOT DETECTED
Enterotoxigenic E coli (ETEC): NOT DETECTED
Giardia lamblia: NOT DETECTED
Norovirus GI/GII: NOT DETECTED
Plesimonas shigelloides: NOT DETECTED
Rotavirus A: NOT DETECTED
SALMONELLA SPECIES: NOT DETECTED
SHIGELLA/ENTEROINVASIVE E COLI (EIEC): NOT DETECTED
Sapovirus (I, II, IV, and V): NOT DETECTED
Shiga like toxin producing E coli (STEC): NOT DETECTED
VIBRIO SPECIES: NOT DETECTED
Vibrio cholerae: NOT DETECTED
Yersinia enterocolitica: NOT DETECTED

## 2018-04-20 LAB — CBC WITH DIFFERENTIAL/PLATELET
Abs Immature Granulocytes: 0.03 K/uL (ref 0.00–0.07)
Basophils Absolute: 0.1 K/uL (ref 0.0–0.1)
Basophils Relative: 1 %
Eosinophils Absolute: 0 K/uL (ref 0.0–0.5)
Eosinophils Relative: 1 %
HCT: 25 % — ABNORMAL LOW (ref 39.0–52.0)
Hemoglobin: 7.7 g/dL — ABNORMAL LOW (ref 13.0–17.0)
Immature Granulocytes: 1 %
Lymphocytes Relative: 17 %
Lymphs Abs: 1 K/uL (ref 0.7–4.0)
MCH: 33.9 pg (ref 26.0–34.0)
MCHC: 30.8 g/dL (ref 30.0–36.0)
MCV: 110.1 fL — ABNORMAL HIGH (ref 80.0–100.0)
Monocytes Absolute: 0.9 K/uL (ref 0.1–1.0)
Monocytes Relative: 17 %
Neutro Abs: 3.6 K/uL (ref 1.7–7.7)
Neutrophils Relative %: 63 %
Platelets: 133 K/uL — ABNORMAL LOW (ref 150–400)
RBC: 2.27 MIL/uL — ABNORMAL LOW (ref 4.22–5.81)
RDW: 13.7 % (ref 11.5–15.5)
WBC: 5.6 K/uL (ref 4.0–10.5)
nRBC: 0.4 % — ABNORMAL HIGH (ref 0.0–0.2)

## 2018-04-20 LAB — GLUCOSE, CAPILLARY
Glucose-Capillary: 110 mg/dL — ABNORMAL HIGH (ref 70–99)
Glucose-Capillary: 77 mg/dL (ref 70–99)
Glucose-Capillary: 106 mg/dL — ABNORMAL HIGH (ref 70–99)
Glucose-Capillary: 133 mg/dL — ABNORMAL HIGH (ref 70–99)

## 2018-04-20 MED ORDER — SODIUM CHLORIDE 0.9% FLUSH
3.0000 mL | Freq: Two times a day (BID) | INTRAVENOUS | Status: DC
  Administered 2018-04-21 – 2018-04-23 (×5): 3 mL via INTRAVENOUS

## 2018-04-20 MED ORDER — ORAL CARE MOUTH RINSE
15.0000 mL | Freq: Two times a day (BID) | OROMUCOSAL | Status: DC
  Administered 2018-04-20 – 2018-04-23 (×5): 15 mL via OROMUCOSAL

## 2018-04-20 NOTE — Clinical Social Work Note (Signed)
The CSW met with the patient and his wife, Patrick Roberts, to discuss the discharge plan and to make bed offers. The patient and his wife have chosen to accept the one bed offer from [facility]. The CSW advised that if additional offers become available, the patient and his wife would be notified. The patient will need prior authorization from [facility], and the [place] request was started on 04/19/2018. The CSW has updated the selection in the HUB and is continuing to follow for discharge facilitation.  Patrick Roberts, MSW, LCSW [phone]

## 2018-04-20 NOTE — Progress Notes (Signed)
Patient ID: Patrick Roberts, male   DOB: [birth year], 68 y.o.   MRN: [id number]  ACP note  Spoke with patient and wife at the bedside  Diagnosis: Alcohol abuse, fever, acute metabolic encephalopathy, acute hypoxic respiratory failure, left patellar fracture, right knee hemarthrosis, chronic kidney disease, chronic systolic congestive heart failure, anemia,  Paroxysmal atrial fibrillation.  Spoke with wife and patient at length.  He needs to stop alcohol or else he will end up dead.  He needs to eat in order to build up nutritional status so he can build up his strength.  I am glad today that his mental status is better and he will hopefully be able to eat and work with physical therapy.  Patient does have many medical issues including chronic systolic congestive heart failure chronic kidney disease and anemia which could be contributing to his overall poor prognosis.  He must stop the alcohol in order to survive.  Time spent on ACP discussion 17 minutes Dr Loletha Grayer

## 2018-04-20 NOTE — Progress Notes (Signed)
Patient ID: Patrick Roberts, male   DOB: [birth year], 68 y.o.   MRN: [id number]  [employer] [facility] PROGRESS NOTE  Patrick Roberts YPP:[id number] DOB: Jun 06, 1950 DOA: 04/12/2018 PCP: No primary care provider on file.  HPI/Subjective: Patient feeling better today.  Patrick to answer questions and speak more clearly.  Little shortness of breath.  A little joint pain.  Feels weak.  Objective: Vitals:   04/20/18 0946 04/20/18 1010  BP:    Pulse:    Resp:    Temp:    SpO2: 98% 99%    Filed Weights   04/12/18 1846 04/13/18 0030  Weight: 83.9 kg 84.1 kg    ROS: Review of Systems  Constitutional: Negative for chills and fever.  Eyes: Negative for blurred vision.  Respiratory: Positive for shortness of breath. Negative for cough.   Cardiovascular: Negative for chest pain.  Gastrointestinal: Negative for abdominal pain, constipation, diarrhea, nausea and vomiting.  Genitourinary: Negative for dysuria.  Musculoskeletal: Positive for joint pain.  Neurological: Negative for dizziness and headaches.   Exam: Physical Exam  HENT:  Nose: No mucosal edema.  Mouth/Throat: No oropharyngeal exudate or posterior oropharyngeal edema.  Eyes: Pupils are equal, round, and reactive to light. Conjunctivae, EOM and lids are normal.  Neck: No JVD present. Carotid bruit is not present. No edema present. No thyroid mass and no thyromegaly present.  Cardiovascular: S1 normal and S2 normal. Exam reveals no gallop.  No murmur heard. Respiratory: No respiratory distress. He has decreased breath sounds in the right lower field and the left lower field. He has no wheezes. He has no rhonchi. He has no rales.  GI: Soft. Bowel sounds are normal. There is no abdominal tenderness.  Musculoskeletal:     Right knee: He exhibits decreased range of motion and swelling.     Right ankle: He exhibits swelling.     Left ankle: He exhibits swelling.  Lymphadenopathy:    He has no cervical adenopathy.  Neurological: He is  alert.  Slight tremor left upper extremity  Skin: Skin is warm. Nails show no clubbing.  Small scab over right knee.  Small scab over the left knee.  Some redness left heel.  Psychiatric: He has a normal mood and affect.      Data Reviewed: Basic Metabolic Panel: Recent Labs  Lab 04/16/18 0340 04/16/18 0557 04/17/18 0516 04/18/18 0403 04/19/18 0513  NA 136 137 140 140 143  K 3.5 3.5 3.3* 3.4* 4.1  CL 101 101 105 105 111  CO2 25 25 26 24 24   GLUCOSE 106* 105* 115* 116* 130*  BUN 17 18 17 18 21   CREATININE 1.51* 1.30* 1.41* 1.33* 1.28*  CALCIUM 7.9* 8.1* 7.8* 8.2* 8.2*  MG 1.8 1.9 1.8 1.7 2.1  PHOS 1.7* 1.4* 2.5 2.4* 2.2*   Liver Function Tests: Recent Labs  Lab 04/15/18 0827 04/16/18 0340  AST 25  --   ALT 22  --   ALKPHOS 51  --   BILITOT 1.5*  --   PROT 5.6*  --   ALBUMIN 2.8* 3.0*    Recent Labs  Lab 04/16/18 0557  AMMONIA 22   CBC: Recent Labs  Lab 04/14/18 1615 04/15/18 0827 04/16/18 0340 04/17/18 0516 04/20/18 0446  WBC 4.2 4.1 5.4 4.6 5.6  NEUTROABS 2.7 2.6 3.1 2.7 3.6  HGB 9.0* 7.9* 8.0* 7.6* 7.7*  HCT 27.5* 24.0* 24.8* 24.1* 25.0*  MCV 105.8* 104.3* 106.9* 108.6* 110.1*  PLT 56* 46* 55* 61* 133*   Cardiac Enzymes:  No results for input(s): CKTOTAL, CKMB, CKMBINDEX, TROPONINI in the last 168 hours.  CBG: Recent Labs  Lab 04/19/18 1206 04/19/18 1656 04/19/18 2053 04/20/18 0800 04/20/18 1153  GLUCAP 105* 114* 130* 110* 77    Recent Results (from the past 240 hour(s))  CULTURE, BLOOD (ROUTINE X 2) w Reflex to ID Panel     Status: None   Collection Time: 04/12/18 10:44 PM  Result Value Ref Range Status   Specimen Description BLOOD RIGHT WRIST  Final   Special Requests   Final    BOTTLES DRAWN AEROBIC AND ANAEROBIC Blood Culture adequate volume   Culture   Final    NO GROWTH 5 DAYS Performed at [facility], [address] [place], [place] [address]    Report Status 04/17/2018 FINAL  Final  CULTURE, BLOOD (ROUTINE X  2) w Reflex to ID Panel     Status: None   Collection Time: 04/12/18 10:44 PM  Result Value Ref Range Status   Specimen Description BLOOD LEFT ANTECUBITAL  Final   Special Requests   Final    BOTTLES DRAWN AEROBIC AND ANAEROBIC Blood Culture adequate volume   Culture   Final    NO GROWTH 5 DAYS Performed at [facility], [address] [place], [place] [address]    Report Status 04/17/2018 FINAL  Final  MRSA PCR Screening     Status: None   Collection Time: 04/12/18 11:38 PM  Result Value Ref Range Status   MRSA by PCR NEGATIVE NEGATIVE Final    Comment:        The GeneXpert MRSA Assay (FDA approved for NASAL specimens only), is one component of a comprehensive MRSA colonization surveillance program. It is not intended to diagnose MRSA infection nor to guide or monitor treatment for MRSA infections. Performed at [facility], [address] [place], [place] [address]   CULTURE, BLOOD (ROUTINE X 2) w Reflex to ID Panel     Status: None (Preliminary result)   Collection Time: 04/16/18  1:08 PM  Result Value Ref Range Status   Specimen Description BLOOD LEFT WRIST  Final   Special Requests   Final    BOTTLES DRAWN AEROBIC AND ANAEROBIC Blood Culture adequate volume   Culture   Final    NO GROWTH 4 DAYS Performed at [facility], [address] [place], [place] [address]    Report Status PENDING  Incomplete  CULTURE, BLOOD (ROUTINE X 2) w Reflex to ID Panel     Status: None (Preliminary result)   Collection Time: 04/16/18  1:16 PM  Result Value Ref Range Status   Specimen Description BLOOD BLOOD RIGHT HAND  Final   Special Requests   Final    BOTTLES DRAWN AEROBIC AND ANAEROBIC Blood Culture adequate volume   Culture   Final    NO GROWTH 4 DAYS Performed at [facility], [address] [place], [place] [address]    Report Status PENDING  Incomplete  Respiratory Panel by PCR     Status: None   Collection Time: 04/16/18  1:37 PM   Result Value Ref Range Status   Adenovirus NOT DETECTED NOT DETECTED Final   Coronavirus 229E NOT DETECTED NOT DETECTED Final    Comment: (NOTE) The Coronavirus on the Respiratory Panel, DOES NOT test for the novel  Coronavirus (2019 nCoV)    Coronavirus HKU1 NOT DETECTED NOT DETECTED Final   Coronavirus NL63 NOT DETECTED NOT DETECTED Final   Coronavirus OC43 NOT  DETECTED NOT DETECTED Final   Metapneumovirus NOT DETECTED NOT DETECTED Final   Rhinovirus / Enterovirus NOT DETECTED NOT DETECTED Final   Influenza A NOT DETECTED NOT DETECTED Final   Influenza B NOT DETECTED NOT DETECTED Final   Parainfluenza Virus 1 NOT DETECTED NOT DETECTED Final   Parainfluenza Virus 2 NOT DETECTED NOT DETECTED Final   Parainfluenza Virus 3 NOT DETECTED NOT DETECTED Final   Parainfluenza Virus 4 NOT DETECTED NOT DETECTED Final   Respiratory Syncytial Virus NOT DETECTED NOT DETECTED Final   Bordetella pertussis NOT DETECTED NOT DETECTED Final   Chlamydophila pneumoniae NOT DETECTED NOT DETECTED Final   Mycoplasma pneumoniae NOT DETECTED NOT DETECTED Final    Comment: Performed at [facility], [place] [address] [place], [place] [address]  Urine Culture     Status: None   Collection Time: 04/16/18  1:37 PM  Result Value Ref Range Status   Specimen Description   Final    URINE, RANDOM Performed at [facility], [address] [place], [place] [address]    Special Requests   Final    NONE Performed at [facility], [address], [place], [place] [address]    Culture   Final    NO GROWTH Performed at [facility], [place] [address] [place],  [address]    Report Status 04/17/2018 FINAL  Final      Scheduled Meds: . diltiazem  180 mg Oral Daily  . dofetilide  250 mcg Oral Q24H  . folic acid  1 mg Oral Daily  . mouth rinse  15 mL Mouth Rinse BID  . metoprolol succinate  100 mg Oral Daily  . multivitamin with minerals  1 tablet Oral Daily  . pantoprazole   40 mg Oral Daily  . sacubitril-valsartan  1 tablet Oral BID  . sodium chloride flush  3 mL Intravenous Q12H  . thiamine  100 mg Oral Daily   Continuous Infusions:   Assessment/Plan:  1. Fever unclear cause.  Respiratory panel negative.  Chest x-ray no pneumonia.  Central line removed.  Right knee likely hemarthrosis.  We will stop antibiotics today and monitor temperature curve the rest of today into tomorrow. 2. Acute metabolic encephalopathy.  This has improved after stopping Ativan 3. Acute hypoxic respiratory failure.   This has resolved and patient is on room air.  Patient is on dysphasia diet 4. Left patellar fracture due to mechanical fall.  Knee immobilizer. 5. Right knee hemarthrosis. 6. Paroxysmal atrial fibrillation holding Xarelto.  On metoprolol diltiazem and Tikosyn 7. Alcohol abuse.  Will need to stop alcohol. 8. Chronic kidney disease stage III.  Continue to monitor. 9. Chronic systolic congestive heart failure.  Patient on Entresto, metoprolol. 10. Anemia.  Patient's hemoglobin dropped down quite a bit since coming into the hospital.  Likely the patient was dehydrated upon admission.  Stop IV fluids today.  Check a ferritin.  Guaiac stool.  Continue to hold Xarelto.  Code Status:     Code Status Orders  (From admission, onward)         Start     Ordered   04/12/18 2231  Full code  Continuous     04/12/18 2230        Code Status History    Date Active Date Inactive Code Status Order ID Comments User Context   08/27/2017 1404 08/30/2017 1727 Full Code [id number]  Charlynn Grimes Inpatient   07/26/2017 1528 07/27/2017 1943 Full Code  [id number]  Baldwin Jamaica, PA-C ED     Disposition Plan: Likely out to rehab.  Earliest potential would be Monday.  Consultants:  Infectious disease  Orthopedic surgery  Time spent: 17minutes, including ACP time.  Came back to speak with wife and patient at the bedside  [employer]

## 2018-04-20 NOTE — Progress Notes (Signed)
He can correctly answer the orientation questions, yet hjis conversation is inappropriate. I.e. He can't find his credit card.  Needs it to go downstairs, yet remembers he's in the hospital.

## 2018-04-20 NOTE — Progress Notes (Signed)
SLP Cancellation Note  Patient Details Name: Patrick Roberts MRN: [id number] DOB: 1950/09/07   Cancelled treatment:       Reason Eval/Treat Not Completed: Other (comment); Upon entry, patient with wet bed sheets & NAs were notified. NAs currently providing assistance for patient care at this time. Will attempt to f/u at a later time.   Loni Beckwith 04/20/2018, 1:05 PM Loni Beckwith, M.S. [place] Speech-Language Pathologist

## 2018-04-20 NOTE — Progress Notes (Signed)
Physical Therapy Treatment Patient Details Name: Patrick Roberts MRN: [id number] DOB: [birth year] Today's Date: 04/20/2018    History of Present Illness Pt is a 68 y.o. male presenting to hospital 04/12/18 s/p fall with head laceration.  Imaging showing L parietal scalp hematoma; also non-displaced sagittal fx through midportion of patella L.  Also large R knee hematoma vs hemarthrosis.  Pt admitted with acute encephalopathy secondary alcohol abuse, acute hypoxic respiratory failure, L patellar fx, paroxysmal a-fib, and macrocytic anemia; pt also noted with fevers.  PMH includes alcohol abuse, CHF, htn, a-fib, CKD stage 3.    PT Comments    Pt awake and willing to try, confusion remains.  To edge of bed with mod a x 2.  Good effort by pt but continues to need +2 assist.  Once sitting he is able to sit unsupported with supervision for general safety.  Attempted standing x 2 but despite max/total assist x 2 he was unable to clear the bed.  Deferred further trials for pt and staff safety.  He was able to lateral scoot in sitting with mod a to move higher up in bed.  Mod a x 2 to return to supine for exercises as below.   Follow Up Recommendations  SNF     Equipment Recommendations       Recommendations for Other Services       Precautions / Restrictions Precautions Precautions: Fall Required Braces or Orthoses: Knee Immobilizer - Left Knee Immobilizer - Left: On at all times Restrictions Weight Bearing Restrictions: Yes LLE Weight Bearing: Weight bearing as tolerated Other Position/Activity Restrictions: Avoid flexion of L knee to avoid displacement to L patellar fx; per ortho MD note, pt WBAT as pain allows in immobilizer    Mobility  Bed Mobility Overal bed mobility: Needs Assistance Bed Mobility: Rolling;Supine to Sit;Sit to Supine Rolling: Mod assist;+2 for physical assistance   Supine to sit: Mod assist;+2 for physical assistance Sit to supine: Mod assist;+2 for physical  assistance   General bed mobility comments: good effort today but continues with significant assist.  Transfers Overall transfer level: Needs assistance Equipment used: Rolling walker (2 wheeled) Transfers: Sit to/from Stand Sit to Stand: Total assist;From elevated surface         General transfer comment: unable to stand pt with 2 assist and vc's/tactile cues for technique after 2 attempts.  Ambulation/Gait             General Gait Details: not safe at this time   Stairs             Wheelchair Mobility    Modified Rankin (Stroke Patients Only)       Balance Overall balance assessment: Needs assistance Sitting-balance support: Bilateral upper extremity supported;Feet supported Sitting balance-Leahy Scale: Poor     Standing balance support: Bilateral upper extremity supported Standing balance-Leahy Scale: Zero                              Cognition Arousal/Alertness: Awake/alert Behavior During Therapy: Flat affect Overall Cognitive Status: Impaired/Different from baseline                                        Exercises Other Exercises Other Exercises: supine AAROM for ankle pumps, SLR, heel slides on right x 10    General Comments  Pertinent Vitals/Pain Pain Assessment: No/denies pain    Home Living                      Prior Function            PT Goals (current goals can now be found in the care plan section) Progress towards PT goals: Progressing toward goals    Frequency    7X/week      PT Plan Current plan remains appropriate    Co-evaluation              AM-PAC PT "6 Clicks" Mobility   Outcome Measure  Help needed turning from your back to your side while in a flat bed without using bedrails?: Total Help needed moving from lying on your back to sitting on the side of a flat bed without using bedrails?: Total Help needed moving to and from a bed to a chair (including a  wheelchair)?: Total Help needed standing up from a chair using your arms (e.g., wheelchair or bedside chair)?: Total Help needed to walk in hospital room?: Total Help needed climbing 3-5 steps with a railing? : Total 6 Click Score: 6    End of Session Equipment Utilized During Treatment: Gait belt;Left knee immobilizer Activity Tolerance: Patient tolerated treatment well Patient left: in bed;with call bell/phone within reach;with bed alarm set Nurse Communication: Mobility status       Time: 3545-6256 PT Time Calculation (min) (ACUTE ONLY): 14 min  Charges:  $Therapeutic Exercise: 8-22 mins                    Chesley Noon, PTA 04/20/18, 12:43 PM

## 2018-04-20 NOTE — Progress Notes (Addendum)
He has had 9 liquid stools in the last 24 hours.  Dr. Leslye Peer has been notified.  Collected a sample, will hold it until I have orders.  1436 GI panel sent

## 2018-04-21 LAB — CBC
HCT: 25 % — ABNORMAL LOW (ref 39.0–52.0)
Hemoglobin: 7.8 g/dL — ABNORMAL LOW (ref 13.0–17.0)
MCH: 33.9 pg (ref 26.0–34.0)
MCHC: 31.2 g/dL (ref 30.0–36.0)
MCV: 108.7 fL — ABNORMAL HIGH (ref 80.0–100.0)
Platelets: 150 10*3/uL (ref 150–400)
RBC: 2.3 MIL/uL — ABNORMAL LOW (ref 4.22–5.81)
RDW: 14.1 % (ref 11.5–15.5)
WBC: 6.3 10*3/uL (ref 4.0–10.5)
nRBC: 0.8 % — ABNORMAL HIGH (ref 0.0–0.2)

## 2018-04-21 LAB — BASIC METABOLIC PANEL
Anion gap: 10 (ref 5–15)
BUN: 24 mg/dL — AB (ref 8–23)
CO2: 22 mmol/L (ref 22–32)
Calcium: 8.8 mg/dL — ABNORMAL LOW (ref 8.9–10.3)
Chloride: 111 mmol/L (ref 98–111)
Creatinine, Ser: 1.51 mg/dL — ABNORMAL HIGH (ref 0.61–1.24)
GFR calc Af Amer: 54 mL/min — ABNORMAL LOW (ref >60)
GFR calc non Af Amer: 47 mL/min — ABNORMAL LOW (ref >60)
GLUCOSE: 136 mg/dL — AB (ref 70–99)
Potassium: 3.7 mmol/L (ref 3.5–5.1)
Sodium: 143 mmol/L (ref 135–145)

## 2018-04-21 LAB — CULTURE, BLOOD (ROUTINE X 2)
Culture: NO GROWTH
Culture: NO GROWTH
SPECIAL REQUESTS: ADEQUATE
Special Requests: ADEQUATE

## 2018-04-21 LAB — TYPE AND SCREEN
ABO/RH(D): O POS
ANTIBODY SCREEN: NEGATIVE

## 2018-04-21 LAB — FERRITIN: Ferritin: 456 ng/mL — ABNORMAL HIGH (ref 24–336)

## 2018-04-21 LAB — GLUCOSE, CAPILLARY
Glucose-Capillary: 110 mg/dL — ABNORMAL HIGH (ref 70–99)
Glucose-Capillary: 103 mg/dL — ABNORMAL HIGH (ref 70–99)
Glucose-Capillary: 113 mg/dL — ABNORMAL HIGH (ref 70–99)
Glucose-Capillary: 123 mg/dL — ABNORMAL HIGH (ref 70–99)

## 2018-04-21 LAB — VITAMIN B12: Vitamin B-12: 608 pg/mL (ref 180–914)

## 2018-04-21 LAB — MAGNESIUM: Magnesium: 2 mg/dL (ref 1.7–2.4)

## 2018-04-21 MED ORDER — QUETIAPINE FUMARATE 25 MG PO TABS: 12.5000 mg | ORAL_TABLET | Freq: Every evening | ORAL | Status: DC | PRN

## 2018-04-21 MED ORDER — HALOPERIDOL LACTATE 5 MG/ML IJ SOLN: 1.0000 mg | Freq: Four times a day (QID) | INTRAMUSCULAR | Status: DC | PRN

## 2018-04-21 NOTE — Progress Notes (Signed)
Physical Therapy Treatment Patient Details Name: Patrick Roberts MRN: [id number] DOB: Sep 10, 1950 Today's Date: 04/21/2018    History of Present Illness Pt is a 68 y.o. male presenting to hospital 04/12/18 s/p fall with head laceration.  Imaging showing L parietal scalp hematoma; also non-displaced sagittal fx through midportion of patella L.  Also large R knee hematoma vs hemarthrosis.  Pt admitted with acute encephalopathy secondary alcohol abuse, acute hypoxic respiratory failure, L patellar fx, paroxysmal a-fib, and macrocytic anemia; pt also noted with fevers.  PMH includes alcohol abuse, CHF, htn, a-fib, CKD stage 3.    PT Comments    Pt still with confusion today but eager to try to stand.  He was able to perform bed mobility with more independence and sit at EOB without physical assistance.  Pt attempted STS 3x with +2 assist and was unable to rise from bedside.  Pt able to perform bed mobility with VC's for R foot and B UE placement.  Pt required manual assist to initiate movement of L LE during there ex but was able to demonstrate understanding of exercises.  PT issued handout and education for scheduling HEP.  Pt presented with agitation at times throughout treatment but would quickly recover.  Pt's wife present throughout treatment for education.   Pt will continue to benefit from skilled PT with focus on strength, tolerance to activity, pain management and safe functional mobility.   Follow Up Recommendations  SNF     Equipment Recommendations       Recommendations for Other Services       Precautions / Restrictions Precautions Precautions: Fall Required Braces or Orthoses: Knee Immobilizer - Left Knee Immobilizer - Left: On at all times Restrictions Weight Bearing Restrictions: Yes LLE Weight Bearing: Weight bearing as tolerated Other Position/Activity Restrictions: Avoid flexion of L knee to avoid displacement to L patellar fx; per ortho MD note, pt WBAT as pain allows in  immobilizer    Mobility  Bed Mobility Overal bed mobility: Needs Assistance Bed Mobility: Rolling;Supine to Sit;Sit to Supine     Supine to sit: Mod assist;+2 for physical assistance Sit to supine: Mod assist;+2 for physical assistance   General bed mobility comments: Pt demonstrated some improvement with LE movement today and was able to get to EOB with assistance for only L LE mobilization.    Transfers Overall transfer level: Needs assistance Equipment used: Rolling walker (2 wheeled) Transfers: Sit to/from Stand Sit to Stand: Total assist;From elevated surface         General transfer comment: 3 attempts with heavy assistance and VC's for foot placement and body mechanics.  Pt stated that he is telling his legs to work and they aren't doing what he tells them.  He also cited weakness as the cause.  Ambulation/Gait             General Gait Details: not safe at this time   Stairs             Wheelchair Mobility    Modified Rankin (Stroke Patients Only)       Balance Overall balance assessment: Needs assistance Sitting-balance support: Bilateral upper extremity supported;Feet supported Sitting balance-Leahy Scale: Poor Sitting balance - Comments: Pt able to sit with supervision only today   Standing balance support: Bilateral upper extremity supported Standing balance-Leahy Scale: Zero  Cognition Arousal/Alertness: Awake/alert Behavior During Therapy: WFL for tasks assessed/performed;Agitated Overall Cognitive Status: Impaired/Different from baseline Area of Impairment: Orientation                 Orientation Level: Disoriented to;Time             General Comments: Pt intermittently agitated today but able to follow commands consistently      Exercises General Exercises - Lower Extremity Quad Sets: 10 reps;Both;Supine Hip ABduction/ADduction: AAROM;Left;Right;Strengthening;10  reps;Supine Straight Leg Raises: AAROM;10 reps;Left;Supine Other Exercises Other Exercises: Educated pt's wife on management of HEP and assistance with ther ex x8  min    General Comments        Pertinent Vitals/Pain Pain Assessment: Faces Faces Pain Scale: Hurts little more Pain Location: L knee when returning to bed Pain Descriptors / Indicators: Aching Pain Intervention(s): Limited activity within patient's tolerance    Home Living                      Prior Function            PT Goals (current goals can now be found in the care plan section) Progress towards PT goals: Progressing toward goals    Frequency    7X/week      PT Plan Current plan remains appropriate    Co-evaluation              AM-PAC PT "6 Clicks" Mobility   Outcome Measure  Help needed turning from your back to your side while in a flat bed without using bedrails?: A Lot Help needed moving from lying on your back to sitting on the side of a flat bed without using bedrails?: A Lot Help needed moving to and from a bed to a chair (including a wheelchair)?: Total Help needed standing up from a chair using your arms (e.g., wheelchair or bedside chair)?: Total Help needed to walk in hospital room?: Total Help needed climbing 3-5 steps with a railing? : Total 6 Click Score: 8    End of Session Equipment Utilized During Treatment: Gait belt;Left knee immobilizer Activity Tolerance: Patient tolerated treatment well;Patient limited by fatigue Patient left: in bed;with call bell/phone within reach;with bed alarm set Nurse Communication: Mobility status PT Visit Diagnosis: Unsteadiness on feet (R26.81);Muscle weakness (generalized) (M62.81)     Time: 2633-3545 PT Time Calculation (min) (ACUTE ONLY): 35 min  Charges:  $Therapeutic Exercise: 8-22 mins $Therapeutic Activity: 8-22 mins                     Roxanne Gates, PT, DPT   Roxanne Gates 04/21/2018, 2:36 PM

## 2018-04-21 NOTE — Progress Notes (Signed)
Pharmacy Electrolyte Monitoring Consult:  Pharmacy consulted to assist in monitoring and replacing electrolytes in this 68 y.o. male admitted on 04/12/2018 with Fall and Laceration   Labs:  Sodium (mmol/L)  Date Value  04/21/2018 143  07/10/2017 143   Potassium (mmol/L)  Date Value  04/21/2018 3.7   Magnesium (mg/dL)  Date Value  04/21/2018 2.0   Phosphorus (mg/dL)  Date Value  04/19/2018 2.2 (L)   Calcium (mg/dL)  Date Value  04/21/2018 8.8 (L)   Albumin (g/dL)  Date Value  04/16/2018 3.0 (L)  01/17/2016 4.6    Assessment/Plan: Current electrolytes being monitored are WNL. No correction doses are needed.  Will recheck levels with am labs.   Pharmacy will continue to monitor and adjust per consult.   Olivia Canter, [facility] 04/21/2018 9:17 AM

## 2018-04-21 NOTE — Progress Notes (Signed)
Patient ID: Patrick Roberts, male   DOB: 08-27-1950, 68 y.o.   MRN: [id number]  [employer] PROGRESS NOTE  Patrick Roberts ASN:[id number] DOB: 03-10-1950 DOA: 04/12/2018 PCP: No primary care provider on file.  HPI/Subjective: Patient was alert and awake.  Some of his conversation was very confused.  He was talking about a 400 pound chicken.  Objective: Vitals:   04/20/18 2340 04/21/18 0752  BP: 120/87 (!) 138/96  Pulse: (!) 103 (!) 103  Resp: (!) 35 20  Temp: 98.4 F (36.9 C) 97.6 F (36.4 C)  SpO2: 96%     Filed Weights   04/12/18 1846 04/13/18 0030  Weight: 83.9 kg 84.1 kg    ROS: Review of Systems  Unable to perform ROS: Acuity of condition   Exam: Physical Exam  HENT:  Nose: No mucosal edema.  Mouth/Throat: No oropharyngeal exudate or posterior oropharyngeal edema.  Eyes: Pupils are equal, round, and reactive to light. Conjunctivae, EOM and lids are normal.  Neck: No JVD present. Carotid bruit is not present. No edema present. No thyroid mass and no thyromegaly present.  Cardiovascular: S1 normal and S2 normal. Exam reveals no gallop.  No murmur heard. Respiratory: No respiratory distress. He has decreased breath sounds in the right lower field and the left lower field. He has no wheezes. He has no rhonchi. He has no rales.  GI: Soft. Bowel sounds are normal. There is no abdominal tenderness.  Musculoskeletal:     Right knee: He exhibits decreased range of motion and swelling.     Right ankle: He exhibits swelling.     Left ankle: He exhibits swelling.  Lymphadenopathy:    He has no cervical adenopathy.  Neurological: He is alert.  Slight tremor left upper extremity  Skin: Skin is warm. Nails show no clubbing.  Small scab over right knee.  Small scab over the left knee.  Some redness left heel.  Psychiatric: He has a normal mood and affect.      Data Reviewed: Basic Metabolic Panel: Recent Labs  Lab 04/16/18 0340 04/16/18 0557 04/17/18 0516  04/18/18 0403 04/19/18 0513 04/21/18 0441  NA 136 137 140 140 143 143  K 3.5 3.5 3.3* 3.4* 4.1 3.7  CL 101 101 105 105 111 111  CO2 25 25 26 24 24 22   GLUCOSE 106* 105* 115* 116* 130* 136*  BUN 17 18 17 18 21  24*  CREATININE 1.51* 1.30* 1.41* 1.33* 1.28* 1.51*  CALCIUM 7.9* 8.1* 7.8* 8.2* 8.2* 8.8*  MG 1.8 1.9 1.8 1.7 2.1 2.0  PHOS 1.7* 1.4* 2.5 2.4* 2.2*  --    Liver Function Tests: Recent Labs  Lab 04/15/18 0827 04/16/18 0340  AST 25  --   ALT 22  --   ALKPHOS 51  --   BILITOT 1.5*  --   PROT 5.6*  --   ALBUMIN 2.8* 3.0*    Recent Labs  Lab 04/16/18 0557  AMMONIA 22   CBC: Recent Labs  Lab 04/14/18 1615 04/15/18 0827 04/16/18 0340 04/17/18 0516 04/20/18 0446 04/21/18 0441  WBC 4.2 4.1 5.4 4.6 5.6 6.3  NEUTROABS 2.7 2.6 3.1 2.7 3.6  --   HGB 9.0* 7.9* 8.0* 7.6* 7.7* 7.8*  HCT 27.5* 24.0* 24.8* 24.1* 25.0* 25.0*  MCV 105.8* 104.3* 106.9* 108.6* 110.1* 108.7*  PLT 56* 46* 55* 61* 133* 150    CBG: Recent Labs  Lab 04/20/18 1153 04/20/18 1649 04/20/18 2127 04/21/18 0801 04/21/18 1200  GLUCAP 77 106* 133* 110* 103*  Recent Results (from the past 240 hour(s))  CULTURE, BLOOD (ROUTINE X 2) w Reflex to ID Panel     Status: None   Collection Time: 04/12/18 10:44 PM  Result Value Ref Range Status   Specimen Description BLOOD RIGHT WRIST  Final   Special Requests   Final    BOTTLES DRAWN AEROBIC AND ANAEROBIC Blood Culture adequate volume   Culture   Final    NO GROWTH 5 DAYS Performed at [facility], [address] [place], [place] [address]    Report Status 04/17/2018 FINAL  Final  CULTURE, BLOOD (ROUTINE X 2) w Reflex to ID Panel     Status: None   Collection Time: 04/12/18 10:44 PM  Result Value Ref Range Status   Specimen Description BLOOD LEFT ANTECUBITAL  Final   Special Requests   Final    BOTTLES DRAWN AEROBIC AND ANAEROBIC Blood Culture adequate volume   Culture   Final    NO GROWTH 5 DAYS Performed at [facility], [address] [place], [place] [address]    Report Status 04/17/2018 FINAL  Final  MRSA PCR Screening     Status: None   Collection Time: 04/12/18 11:38 PM  Result Value Ref Range Status   MRSA by PCR NEGATIVE NEGATIVE Final    Comment:        The GeneXpert MRSA Assay (FDA approved for NASAL specimens only), is one component of a comprehensive MRSA colonization surveillance program. It is not intended to diagnose MRSA infection nor to guide or monitor treatment for MRSA infections. Performed at [facility], [address] [place], [place] [address]   CULTURE, BLOOD (ROUTINE X 2) w Reflex to ID Panel     Status: None   Collection Time: 04/16/18  1:08 PM  Result Value Ref Range Status   Specimen Description BLOOD LEFT WRIST  Final   Special Requests   Final    BOTTLES DRAWN AEROBIC AND ANAEROBIC Blood Culture adequate volume   Culture   Final    NO GROWTH 5 DAYS Performed at [facility], [address] [place], [place] [address]    Report Status 04/21/2018 FINAL  Final  CULTURE, BLOOD (ROUTINE X 2) w Reflex to ID Panel     Status: None   Collection Time: 04/16/18  1:16 PM  Result Value Ref Range Status   Specimen Description BLOOD BLOOD RIGHT HAND  Final   Special Requests   Final    BOTTLES DRAWN AEROBIC AND ANAEROBIC Blood Culture adequate volume   Culture   Final    NO GROWTH 5 DAYS Performed at [facility], [address] [place], [place] [address]    Report Status 04/21/2018 FINAL  Final  Respiratory Panel by PCR     Status: None   Collection Time: 04/16/18  1:37 PM  Result Value Ref Range Status   Adenovirus NOT DETECTED NOT DETECTED Final   Coronavirus 229E NOT DETECTED NOT DETECTED Final    Comment: (NOTE) The Coronavirus on the Respiratory Panel, DOES NOT test for the novel  Coronavirus (2019 nCoV)    Coronavirus HKU1 NOT DETECTED NOT DETECTED Final   Coronavirus NL63 NOT DETECTED NOT DETECTED Final   Coronavirus  OC43 NOT DETECTED NOT DETECTED Final   Metapneumovirus NOT DETECTED NOT DETECTED Final   Rhinovirus / Enterovirus NOT DETECTED NOT DETECTED Final   Influenza A NOT DETECTED NOT DETECTED Final   Influenza B NOT DETECTED NOT DETECTED Final  Parainfluenza Virus 1 NOT DETECTED NOT DETECTED Final   Parainfluenza Virus 2 NOT DETECTED NOT DETECTED Final   Parainfluenza Virus 3 NOT DETECTED NOT DETECTED Final   Parainfluenza Virus 4 NOT DETECTED NOT DETECTED Final   Respiratory Syncytial Virus NOT DETECTED NOT DETECTED Final   Bordetella pertussis NOT DETECTED NOT DETECTED Final   Chlamydophila pneumoniae NOT DETECTED NOT DETECTED Final   Mycoplasma pneumoniae NOT DETECTED NOT DETECTED Final    Comment: Performed at [facility], [place] [address] [place], [place] [address]  Urine Culture     Status: None   Collection Time: 04/16/18  1:37 PM  Result Value Ref Range Status   Specimen Description   Final    URINE, RANDOM Performed at [facility], [address], [place], [place] [address]    Special Requests   Final    NONE Performed at [facility], [address], [place], [place] [address]    Culture   Final    NO GROWTH Performed at [facility], [place] [address], [place], [place] [address]    Report Status 04/17/2018 FINAL  Final  Gastrointestinal Panel by PCR , Stool     Status: None   Collection Time: 04/20/18  2:28 PM  Result Value Ref Range Status   Campylobacter species NOT DETECTED NOT DETECTED Final   Plesimonas shigelloides NOT DETECTED NOT DETECTED Final   Salmonella species NOT DETECTED NOT DETECTED Final   Yersinia enterocolitica NOT DETECTED NOT DETECTED Final   Vibrio species NOT DETECTED NOT DETECTED Final   Vibrio cholerae NOT DETECTED NOT DETECTED Final   Enteroaggregative E coli (EAEC) NOT DETECTED NOT DETECTED Final   Enteropathogenic E coli (EPEC) NOT DETECTED NOT DETECTED Final   Enterotoxigenic E coli (ETEC) NOT DETECTED NOT  DETECTED Final   Shiga like toxin producing E coli (STEC) NOT DETECTED NOT DETECTED Final   Shigella/Enteroinvasive E coli (EIEC) NOT DETECTED NOT DETECTED Final   Cryptosporidium NOT DETECTED NOT DETECTED Final   Cyclospora cayetanensis NOT DETECTED NOT DETECTED Final   Entamoeba histolytica NOT DETECTED NOT DETECTED Final   Giardia lamblia NOT DETECTED NOT DETECTED Final   Adenovirus F40/41 NOT DETECTED NOT DETECTED Final   Astrovirus NOT DETECTED NOT DETECTED Final   Norovirus GI/GII NOT DETECTED NOT DETECTED Final   Rotavirus A NOT DETECTED NOT DETECTED Final   Sapovirus (I, II, IV, and V) NOT DETECTED NOT DETECTED Final    Comment: Performed at [facility], [address] [place], [place] [address]      Scheduled Meds: . diltiazem  180 mg Oral Daily  . dofetilide  250 mcg Oral Q24H  . folic acid  1 mg Oral Daily  . mouth rinse  15 mL Mouth Rinse BID  . metoprolol succinate  100 mg Oral Daily  . multivitamin with minerals  1 tablet Oral Daily  . pantoprazole  40 mg Oral Daily  . sacubitril-valsartan  1 tablet Oral BID  . sodium chloride flush  3 mL Intravenous Q12H  . thiamine  100 mg Oral Daily   Continuous Infusions:   Assessment/Plan:  1. Acute delirium.  Since patient is on Tikosyn I have to be careful with other medications.  We will just have to wait at this point.  I will not give Ativan because the patient did have unresponsiveness with those medications. 2. Fever has resolved. respiratory panel negative.  Chest x-ray no pneumonia.  Central line removed.  Right knee likely  hemarthrosis.  No fever after stopping antibiotics. 3. Acute hypoxic respiratory failure.   This has resolved and patient is on room air.  Patient is on dysphasia diet.  4. Left patellar fracture due to mechanical fall.  Knee immobilizer. 5. Right knee hemarthrosis. 6. Paroxysmal atrial fibrillation holding Xarelto.  On metoprolol diltiazem and Tikosyn 7. Alcohol abuse.  Will need to  stop alcohol. 8. Chronic kidney disease stage III.  Continue to monitor. 9. Chronic systolic congestive heart failure.  Patient on Entresto, metoprolol. 10. Anemia of chronic disease.  Ferritin is elevated.  Hemoglobin up a little bit.  Continue to monitor.  Code Status:     Code Status Orders  (From admission, onward)         Start     Ordered   04/12/18 2231  Full code  Continuous     04/12/18 2230        Code Status History    Date Active Date Inactive Code Status Order ID Comments User Context   08/27/2017 1404 08/30/2017 1727 Full Code [id number]  Charlynn Grimes Inpatient   07/26/2017 1528 07/27/2017 1943 Full Code [id number]  Baldwin [place], PA-C ED     Disposition Plan: Will need rehab but delirium will need to clear first.  Consultants:  Infectious disease  Orthopedic surgery  Time spent: 27 minutes Spoke with wife at the bedside yesterday  Patrick Roberts [employer]

## 2018-04-21 NOTE — Progress Notes (Signed)
Attempted to call report x 1  

## 2018-04-21 NOTE — Clinical Social Work Note (Signed)
The CSW received a call from [facility] with an authorization for SNF beginning 04/22/2018. The CSW updated the contact with the patient's choice of [facility] The authorization number is [id number], and the levels are as follow: PT/OT: TE; SLP: SD; Nursing: PDE1; NPA: NF.   The SNF will have a review date on 04/24/2018 for continued stay, and the SNF would fax the review to Nunzio Cory [phone] fax). The CSW has updated the medical team of the authorization; the patient is not stable today for discharge and will be re-evaluated tomorrow. The CSW is following.  Santiago Bumpers, MSW, LCSW [phone]

## 2018-04-21 NOTE — Progress Notes (Signed)
Report given.

## 2018-04-22 ENCOUNTER — Inpatient Hospital Stay: Payer: Medicare Other

## 2018-04-22 LAB — BASIC METABOLIC PANEL WITH GFR
Anion gap: 8 (ref 5–15)
BUN: 22 mg/dL (ref 8–23)
CO2: 25 mmol/L (ref 22–32)
Calcium: 8.5 mg/dL — ABNORMAL LOW (ref 8.9–10.3)
Chloride: 110 mmol/L (ref 98–111)
Creatinine, Ser: 1.33 mg/dL — ABNORMAL HIGH (ref 0.61–1.24)
GFR calc Af Amer: >60 mL/min
GFR calc non Af Amer: 55 mL/min — ABNORMAL LOW
Glucose, Bld: 128 mg/dL — ABNORMAL HIGH (ref 70–99)
Potassium: 3.5 mmol/L (ref 3.5–5.1)
Sodium: 143 mmol/L (ref 135–145)

## 2018-04-22 LAB — HEMOGLOBIN: Hemoglobin: 8 g/dL — ABNORMAL LOW (ref 13.0–17.0)

## 2018-04-22 LAB — GLUCOSE, CAPILLARY
Glucose-Capillary: 109 mg/dL — ABNORMAL HIGH (ref 70–99)
Glucose-Capillary: 113 mg/dL — ABNORMAL HIGH (ref 70–99)
Glucose-Capillary: 123 mg/dL — ABNORMAL HIGH (ref 70–99)
Glucose-Capillary: 118 mg/dL — ABNORMAL HIGH (ref 70–99)

## 2018-04-22 LAB — MAGNESIUM: Magnesium: 1.8 mg/dL (ref 1.7–2.4)

## 2018-04-22 MED ORDER — METOPROLOL SUCCINATE ER 25 MG PO TB24
25.0000 mg | ORAL_TABLET | Freq: Every day | ORAL | Status: DC
  Administered 2018-04-23: 25 mg via ORAL
  Filled 2018-04-22: qty 1

## 2018-04-22 MED ORDER — MAGNESIUM SULFATE 2 GM/50ML IV SOLN
2.0000 g | Freq: Once | INTRAVENOUS | Status: AC
  Administered 2018-04-22: 2 g via INTRAVENOUS
  Filled 2018-04-22: qty 50

## 2018-04-22 MED ORDER — SODIUM CHLORIDE 0.9 % IV SOLN
INTRAVENOUS | Status: DC | PRN
  Administered 2018-04-22 – 2018-04-23 (×2): 250 mL via INTRAVENOUS

## 2018-04-22 MED ORDER — POTASSIUM CHLORIDE 20 MEQ PO PACK
40.0000 meq | PACK | Freq: Once | ORAL | Status: AC
  Administered 2018-04-22: 40 meq via ORAL
  Filled 2018-04-22: qty 2

## 2018-04-22 MED ORDER — SODIUM CHLORIDE 0.9 % IV BOLUS
250.0000 mL | Freq: Once | INTRAVENOUS | Status: AC
  Administered 2018-04-22: 250 mL via INTRAVENOUS

## 2018-04-22 MED ORDER — TRAZODONE HCL 50 MG PO TABS
50.0000 mg | ORAL_TABLET | Freq: Every day | ORAL | Status: DC
  Administered 2018-04-22: 50 mg via ORAL
  Filled 2018-04-22: qty 1

## 2018-04-22 MED ORDER — POTASSIUM CHLORIDE 10 MEQ/100ML IV SOLN
10.0000 meq | INTRAVENOUS | Status: DC
  Administered 2018-04-22: 10 meq via INTRAVENOUS
  Filled 2018-04-22: qty 100

## 2018-04-22 NOTE — Progress Notes (Signed)
Physical Therapy Treatment Patient Details Name: Patrick Roberts MRN: [id number] DOB: 09-06-1950 Today's Date: 04/22/2018    History of Present Illness Pt is a 68 y.o. male presenting to hospital 04/12/18 s/p fall with head laceration.  Imaging showing L parietal scalp hematoma; also non-displaced sagittal fx through midportion of patella L.  Also large R knee hematoma vs hemarthrosis.  Pt admitted with acute encephalopathy secondary alcohol abuse, acute hypoxic respiratory failure, L patellar fx, paroxysmal a-fib, and macrocytic anemia; pt also noted with fevers.  PMH includes alcohol abuse, CHF, htn, a-fib, CKD stage 3.    PT Comments    Pt agreeable to PT; facial expressions for pain in L knee. L knee immobilizer changed (per nursing request). Pt lethargic and difficulty following instruction, staying on task and demonstrates some confusion speaking of non relevant subjects intermittently throughout session. Pt requires verbal, tactile cues and assist throughout session. Continue PT to progress participation, strength and endurance to improve all functional mobility towards baseline.    Follow Up Recommendations  SNF     Equipment Recommendations  Rolling walker with 5" wheels;Wheelchair (measurements PT);Wheelchair cushion (measurements PT);3in1 (PT)    Recommendations for Other Services       Precautions / Restrictions Precautions Precautions: Fall Required Braces or Orthoses: Knee Immobilizer - Left Knee Immobilizer - Left: On at all times Restrictions Weight Bearing Restrictions: Yes LLE Weight Bearing: Weight bearing as tolerated    Mobility  Bed Mobility               General bed mobility comments: Not tested; pt lethargic and difficulty keeping attention on task  Transfers                    Ambulation/Gait                 Stairs             Wheelchair Mobility    Modified Rankin (Stroke Patients Only)       Balance                                            Cognition Arousal/Alertness: Lethargic Behavior During Therapy: WFL for tasks assessed/performed;Agitated Overall Cognitive Status: Impaired/Different from baseline Area of Impairment: Orientation;Attention;Safety/judgement;Awareness                 Orientation Level: Disoriented to;Place;Time;Situation Current Attention Level: Alternating     Safety/Judgement: Decreased awareness of deficits     General Comments: Pt mumbling with intermittent confusion speaking of non relavent information. Does not follow 1 step commands consistently      Exercises General Exercises - Lower Extremity Ankle Circles/Pumps: AROM;Both;10 reps;Supine Quad Sets: Strengthening;Both;10 reps Gluteal Sets: Strengthening;Both;10 reps Short Arc Quad: AROM;Right;10 reps;AAROM Heel Slides: AAROM;Right;10 reps Hip ABduction/ADduction: AAROM;Right;10 reps Straight Leg Raises: AAROM;Both;10 reps Other Exercises Other Exercises: replaced L knee immobilizer    General Comments        Pertinent Vitals/Pain Pain Assessment: Faces Faces Pain Scale: Hurts little more Pain Location: L knee  Pain Descriptors / Indicators: Aching Pain Intervention(s): Other (comment)(new immobilizer placed)    Home Living                      Prior Function            PT Goals (current goals can now be found  in the care plan section) Progress towards PT goals: Progressing toward goals(slowly)    Frequency    7X/week      PT Plan Current plan remains appropriate    Co-evaluation              AM-PAC PT "6 Clicks" Mobility   Outcome Measure  Help needed turning from your back to your side while in a flat bed without using bedrails?: A Lot Help needed moving from lying on your back to sitting on the side of a flat bed without using bedrails?: A Lot Help needed moving to and from a bed to a chair (including a wheelchair)?: Total Help needed  standing up from a chair using your arms (e.g., wheelchair or bedside chair)?: Total Help needed to walk in hospital room?: Total Help needed climbing 3-5 steps with a railing? : Total 6 Click Score: 8    End of Session Equipment Utilized During Treatment: Left knee immobilizer Activity Tolerance: Patient limited by lethargy Patient left: in bed;with call bell/phone within reach;with bed alarm set;Other (comment)(immobilizer on L)   PT Visit Diagnosis: Unsteadiness on feet (R26.81);Muscle weakness (generalized) (M62.81)     Time: 8466-5993 PT Time Calculation (min) (ACUTE ONLY): 32 min  Charges:  $Therapeutic Exercise: 23-37 mins                      Larae Grooms, PTA 04/22/2018, 5:35 PM

## 2018-04-22 NOTE — Progress Notes (Signed)
Pharmacy Electrolyte Monitoring Consult:  Pharmacy consulted to assist in monitoring and replacing electrolytes in this 68 y.o. male admitted on 04/12/2018 with Fall and Laceration   Labs:  Sodium (mmol/L)  Date Value  04/22/2018 143  07/10/2017 143   Potassium (mmol/L)  Date Value  04/22/2018 3.5   Magnesium (mg/dL)  Date Value  04/22/2018 1.8   Phosphorus (mg/dL)  Date Value  04/19/2018 2.2 (L)   Calcium (mg/dL)  Date Value  04/22/2018 8.5 (L)   Albumin (g/dL)  Date Value  04/16/2018 3.0 (L)  01/17/2016 4.6    Assessment/Plan: Potassium and magnesium are below threshold levels considering he is on dofetelide  Will order Potassium Chloride 10 mEq IV q1hr x 3 doses.  An additional order for IV magnesium sulfate 2 grams once is being added  Will recheck levels with am labs.   Pharmacy will continue to monitor and adjust per consult.   Vallery Sa, PharmD 04/22/2018 7:06 AM

## 2018-04-22 NOTE — Progress Notes (Signed)
  Speech Language Pathology Treatment: Dysphagia  Patient Details Name: Patrick Roberts MRN: [id number] DOB: February 19, 1950 Today's Date: 04/22/2018 Time: 8889-1694 SLP Time Calculation (min) (ACUTE ONLY): 40 min  Assessment / Plan / Recommendation Clinical Impression  Pt seen for ongoing assessment of swallowing; trials to upgrade his diet consistency as tolerates. Pt is more alert; less impacted by sedating medications. However, pt's confusion is moderate-severe w/ reports of pt talking about "400lb chickens", getting to the "golf tournament", as well as getting out of the bed and pulling off his catheter lying in urine. SLP found pt w/ his legs/bottom off the side of the bed, back on the bed. NSG help SLP reposition pt back in the bed then upright for po trials to upgrade diet consistency.     HPI HPI: Pt is a 68 y.o. male with a known history of alcohol abuse, essential hypertension, atrial fibrillation who is presenting to the emergency room after a fall and laceration to his head.  Patient states that he was drinking earlier and lost balance and fell.  In the emergency room he was noted to be hypothermic, and hypotensive.  He received 4 L of fluid bolus blood pressure still very low.  Patient otherwise is awake and alert denies any chest pain or shortness of breath. Pt is under CIWA precautions receiving Ativan d/t Acute encephalopathy secondary to alcohol abuse.  Pt is quite impulsive and requires cues for follow through d/t Confusion.       SLP Plan  Continue with current plan of care       Recommendations  Diet recommendations: Dysphagia 3 (mechanical soft);Thin liquid Liquids provided via: Cup;No straw Medication Administration: Whole meds with puree(or Crushed in puree as needed for safety) Supervision: Patient able to self feed;Staff to assist with self feeding;Intermittent supervision to cue for compensatory strategies Compensations: Minimize environmental distractions;Slow rate;Small  sips/bites;Lingual sweep for clearance of pocketing;Follow solids with liquid Postural Changes and/or Swallow Maneuvers: Seated upright 90 degrees;Upright 30-60 min after meal                General recommendations: (Dietician f/u) Oral Care Recommendations: Oral care BID;Staff/trained caregiver to provide oral care Follow up Recommendations: Skilled Nursing facility(TBD) SLP Visit Diagnosis: Dysphagia, unspecified (R13.10)(impacted by declined Cognitive status) Plan: Continue with current plan of care       [place], [place], CCC-SLP Patrick Roberts 04/22/2018, 3:05 PM

## 2018-04-22 NOTE — Progress Notes (Signed)
Patient ID: Patrick Roberts, male   DOB: 09/05/1950, 68 y.o.   MRN: [id number]  [facility] PROGRESS NOTE  Patrick Roberts LFY:[id number] DOB: 01/25/1951 DOA: 04/12/2018 PCP: No primary care provider on file.  HPI/Subjective: Patient feels okay.  Looking forward to getting out of the hospital and going to rehab.  Offers no complaints.  Objective: Vitals:   04/22/18 0452 04/22/18 1226  BP: 96/68 110/76  Pulse: 85 95  Resp: 20   Temp: 99.6 F (37.6 C)   SpO2: 96% 96%    Filed Weights   04/12/18 1846 04/13/18 0030  Weight: 83.9 kg 84.1 kg    ROS: Review of Systems  Unable to perform ROS: Acuity of condition  Respiratory: Negative for shortness of breath.   Cardiovascular: Negative for chest pain.  Gastrointestinal: Negative for abdominal pain, diarrhea, nausea and vomiting.   Exam: Physical Exam  HENT:  Nose: No mucosal edema.  Mouth/Throat: No oropharyngeal exudate or posterior oropharyngeal edema.  Eyes: Pupils are equal, round, and reactive to light. Conjunctivae, EOM and lids are normal.  Neck: No JVD present. Carotid bruit is not present. No edema present. No thyroid mass and no thyromegaly present.  Cardiovascular: S1 normal and S2 normal. Exam reveals no gallop.  No murmur heard. Respiratory: No respiratory distress. He has decreased breath sounds in the right lower field and the left lower field. He has no wheezes. He has no rhonchi. He has no rales.  GI: Soft. Bowel sounds are normal. There is no abdominal tenderness.  Musculoskeletal:     Right knee: He exhibits decreased range of motion and swelling.     Right ankle: He exhibits swelling.     Left ankle: He exhibits swelling.  Lymphadenopathy:    He has no cervical adenopathy.  Neurological: He is alert.  Slight tremor left upper extremity  Skin: Skin is warm. Nails show no clubbing.  Small scab over right knee.  Small scab over the left knee.  Some redness left heel.  Psychiatric: He has a normal mood and  affect.      Data Reviewed: Basic Metabolic Panel: Recent Labs  Lab 04/16/18 0340 04/16/18 0557 04/17/18 0516 04/18/18 0403 04/19/18 0513 04/21/18 0441 04/22/18 0406  NA 136 137 140 140 143 143 143  K 3.5 3.5 3.3* 3.4* 4.1 3.7 3.5  CL 101 101 105 105 111 111 110  CO2 25 25 26 24 24 22 25   GLUCOSE 106* 105* 115* 116* 130* 136* 128*  BUN 17 18 17 18 21  24* 22  CREATININE 1.51* 1.30* 1.41* 1.33* 1.28* 1.51* 1.33*  CALCIUM 7.9* 8.1* 7.8* 8.2* 8.2* 8.8* 8.5*  MG 1.8 1.9 1.8 1.7 2.1 2.0 1.8  PHOS 1.7* 1.4* 2.5 2.4* 2.2*  --   --    Liver Function Tests: Recent Labs  Lab 04/16/18 0340  ALBUMIN 3.0*    Recent Labs  Lab 04/16/18 0557  AMMONIA 22   CBC: Recent Labs  Lab 04/16/18 0340 04/17/18 0516 04/20/18 0446 04/21/18 0441 04/22/18 0406  WBC 5.4 4.6 5.6 6.3  --   NEUTROABS 3.1 2.7 3.6  --   --   HGB 8.0* 7.6* 7.7* 7.8* 8.0*  HCT 24.8* 24.1* 25.0* 25.0*  --   MCV 106.9* 108.6* 110.1* 108.7*  --   PLT 55* 61* 133* 150  --     CBG: Recent Labs  Lab 04/21/18 1200 04/21/18 1704 04/21/18 2208 04/22/18 0735 04/22/18 1149  GLUCAP 103* 113* 123* 109* 113*  Recent Results (from the past 240 hour(s))  CULTURE, BLOOD (ROUTINE X 2) w Reflex to ID Panel     Status: None   Collection Time: 04/12/18 10:44 PM  Result Value Ref Range Status   Specimen Description BLOOD RIGHT WRIST  Final   Special Requests   Final    BOTTLES DRAWN AEROBIC AND ANAEROBIC Blood Culture adequate volume   Culture   Final    NO GROWTH 5 DAYS Performed at [facility], [address] [place], [place] [address]    Report Status 04/17/2018 FINAL  Final  CULTURE, BLOOD (ROUTINE X 2) w Reflex to ID Panel     Status: None   Collection Time: 04/12/18 10:44 PM  Result Value Ref Range Status   Specimen Description BLOOD LEFT ANTECUBITAL  Final   Special Requests   Final    BOTTLES DRAWN AEROBIC AND ANAEROBIC Blood Culture adequate volume   Culture   Final    NO GROWTH 5  DAYS Performed at [facility], [address] [place], [place] [address]    Report Status 04/17/2018 FINAL  Final  MRSA PCR Screening     Status: None   Collection Time: 04/12/18 11:38 PM  Result Value Ref Range Status   MRSA by PCR NEGATIVE NEGATIVE Final    Comment:        The GeneXpert MRSA Assay (FDA approved for NASAL specimens only), is one component of a comprehensive MRSA colonization surveillance program. It is not intended to diagnose MRSA infection nor to guide or monitor treatment for MRSA infections. Performed at [facility], [address] [place], [place] [address]   CULTURE, BLOOD (ROUTINE X 2) w Reflex to ID Panel     Status: None   Collection Time: 04/16/18  1:08 PM  Result Value Ref Range Status   Specimen Description BLOOD LEFT WRIST  Final   Special Requests   Final    BOTTLES DRAWN AEROBIC AND ANAEROBIC Blood Culture adequate volume   Culture   Final    NO GROWTH 5 DAYS Performed at [facility], [address] [place], [place] [address]    Report Status 04/21/2018 FINAL  Final  CULTURE, BLOOD (ROUTINE X 2) w Reflex to ID Panel     Status: None   Collection Time: 04/16/18  1:16 PM  Result Value Ref Range Status   Specimen Description BLOOD BLOOD RIGHT HAND  Final   Special Requests   Final    BOTTLES DRAWN AEROBIC AND ANAEROBIC Blood Culture adequate volume   Culture   Final    NO GROWTH 5 DAYS Performed at [facility], [address] [place], [place] [address]    Report Status 04/21/2018 FINAL  Final  Respiratory Panel by PCR     Status: None   Collection Time: 04/16/18  1:37 PM  Result Value Ref Range Status   Adenovirus NOT DETECTED NOT DETECTED Final   Coronavirus 229E NOT DETECTED NOT DETECTED Final    Comment: (NOTE) The Coronavirus on the Respiratory Panel, DOES NOT test for the novel  Coronavirus (2019 nCoV)    Coronavirus HKU1 NOT DETECTED NOT DETECTED Final   Coronavirus NL63 NOT  DETECTED NOT DETECTED Final   Coronavirus OC43 NOT DETECTED NOT DETECTED Final   Metapneumovirus NOT DETECTED NOT DETECTED Final   Rhinovirus / Enterovirus NOT DETECTED NOT DETECTED Final   Influenza A NOT DETECTED NOT DETECTED Final   Influenza B NOT DETECTED NOT DETECTED Final  Parainfluenza Virus 1 NOT DETECTED NOT DETECTED Final   Parainfluenza Virus 2 NOT DETECTED NOT DETECTED Final   Parainfluenza Virus 3 NOT DETECTED NOT DETECTED Final   Parainfluenza Virus 4 NOT DETECTED NOT DETECTED Final   Respiratory Syncytial Virus NOT DETECTED NOT DETECTED Final   Bordetella pertussis NOT DETECTED NOT DETECTED Final   Chlamydophila pneumoniae NOT DETECTED NOT DETECTED Final   Mycoplasma pneumoniae NOT DETECTED NOT DETECTED Final    Comment: Performed at [facility], [place] [address] [place], [place] [address]  Urine Culture     Status: None   Collection Time: 04/16/18  1:37 PM  Result Value Ref Range Status   Specimen Description   Final    URINE, RANDOM Performed at [facility], [address] [place], [place] [address]    Special Requests   Final    NONE Performed at [facility], [address], [place], [place] [address]    Culture   Final    NO GROWTH Performed at [facility], [place] [address] [place], [place] [address]    Report Status 04/17/2018 FINAL  Final  Gastrointestinal Panel by PCR , Stool     Status: None   Collection Time: 04/20/18  2:28 PM  Result Value Ref Range Status   Campylobacter species NOT DETECTED NOT DETECTED Final   Plesimonas shigelloides NOT DETECTED NOT DETECTED Final   Salmonella species NOT DETECTED NOT DETECTED Final   Yersinia enterocolitica NOT DETECTED NOT DETECTED Final   Vibrio species NOT DETECTED NOT DETECTED Final   Vibrio cholerae NOT DETECTED NOT DETECTED Final   Enteroaggregative E coli (EAEC) NOT DETECTED NOT DETECTED Final   Enteropathogenic E coli (EPEC) NOT DETECTED NOT DETECTED Final    Enterotoxigenic E coli (ETEC) NOT DETECTED NOT DETECTED Final   Shiga like toxin producing E coli (STEC) NOT DETECTED NOT DETECTED Final   Shigella/Enteroinvasive E coli (EIEC) NOT DETECTED NOT DETECTED Final   Cryptosporidium NOT DETECTED NOT DETECTED Final   Cyclospora cayetanensis NOT DETECTED NOT DETECTED Final   Entamoeba histolytica NOT DETECTED NOT DETECTED Final   Giardia lamblia NOT DETECTED NOT DETECTED Final   Adenovirus F40/41 NOT DETECTED NOT DETECTED Final   Astrovirus NOT DETECTED NOT DETECTED Final   Norovirus GI/GII NOT DETECTED NOT DETECTED Final   Rotavirus A NOT DETECTED NOT DETECTED Final   Sapovirus (I, II, IV, and V) NOT DETECTED NOT DETECTED Final    Comment: Performed at [facility], [address] [place], [place] [address]      Scheduled Meds: . dofetilide  250 mcg Oral Q24H  . folic acid  1 mg Oral Daily  . mouth rinse  15 mL Mouth Rinse BID  . [START ON 04/23/2018] metoprolol succinate  25 mg Oral Daily  . multivitamin with minerals  1 tablet Oral Daily  . pantoprazole  40 mg Oral Daily  . sodium chloride flush  3 mL Intravenous Q12H  . thiamine  100 mg Oral Daily  . traZODone  50 mg Oral QHS   Continuous Infusions: . sodium chloride 250 mL (04/22/18 1004)  . magnesium sulfate 1 - 4 g bolus IVPB      Assessment/Plan:  1. Acute delirium.  Since patient is on Tikosyn I have to be careful with other medications.  I will give trazodone at night.  Holding off on any further Ativan because he was lethargic with that. 2. Relative hypotension.  Hold Entresto.  Hold Cardizem.  Decrease dose of metoprolol.  IV fluid bolus given. 3. Fever of unclear etiology.  Patient was on antibiotics for a few days and then antibiotics stopped.  Today did have a temperature of 99.6.  Continue to monitor temperature curve 1 more day.  Repeat chest x-ray. 4. Acute hypoxic respiratory failure.   This has resolved and patient is on room air.  Patient is on dysphasia  diet.  5. Left patellar fracture due to mechanical fall.  Knee immobilizer. 6. Right knee hemarthrosis. 7. Paroxysmal atrial fibrillation holding Xarelto.  On Tikosyn.  Decrease dose of metoprolol.  Hold Cardizem 8. Alcohol abuse.  Will need to stop alcohol. 9. Chronic kidney disease stage III.  Continue to monitor. 10. Chronic systolic congestive heart failure.  Patient on Entresto, metoprolol. 11. Anemia of chronic disease.  Ferritin is elevated.  Hemoglobin up a little bit.  Continue to monitor.  Code Status:     Code Status Orders  (From admission, onward)         Start     Ordered   04/12/18 2231  Full code  Continuous     04/12/18 2230        Code Status History    Date Active Date Inactive Code Status Order ID Comments User Context   08/27/2017 1404 08/30/2017 1727 Full Code [id number]  Charlynn Grimes Inpatient   07/26/2017 1528 07/27/2017 1943 Full Code [id number]  Baldwin [place], PA-C ED     Disposition Plan: Evaluate daily on whether his delirium has cleared on whether he can go out to rehab.  Consultants:  Infectious disease  Orthopedic surgery  Time spent: 28 minutes Spoke with wife on the phone today  Orren Pietsch [employer]

## 2018-04-23 LAB — BASIC METABOLIC PANEL
Anion gap: 7 (ref 5–15)
BUN: 15 mg/dL (ref 8–23)
CO2: 25 mmol/L (ref 22–32)
Calcium: 8.2 mg/dL — ABNORMAL LOW (ref 8.9–10.3)
Chloride: 111 mmol/L (ref 98–111)
Creatinine, Ser: 1.03 mg/dL (ref 0.61–1.24)
GFR calc Af Amer: >60 mL/min (ref >60)
GFR calc non Af Amer: >60 mL/min (ref >60)
Glucose, Bld: 116 mg/dL — ABNORMAL HIGH (ref 70–99)
Potassium: 3.8 mmol/L (ref 3.5–5.1)
SODIUM: 143 mmol/L (ref 135–145)

## 2018-04-23 LAB — GLUCOSE, CAPILLARY
Glucose-Capillary: 91 mg/dL (ref 70–99)
Glucose-Capillary: 100 mg/dL — ABNORMAL HIGH (ref 70–99)

## 2018-04-23 LAB — HEMOGLOBIN: HEMOGLOBIN: 8.2 g/dL — AB (ref 13.0–17.0)

## 2018-04-23 LAB — MAGNESIUM: Magnesium: 1.7 mg/dL (ref 1.7–2.4)

## 2018-04-23 LAB — PHOSPHORUS: Phosphorus: 2.8 mg/dL (ref 2.5–4.6)

## 2018-04-23 MED ORDER — TRAZODONE HCL 50 MG PO TABS: 50.0000 mg | ORAL_TABLET | Freq: Every day | ORAL | 0 refills | Status: AC

## 2018-04-23 MED ORDER — POTASSIUM CHLORIDE 20 MEQ PO PACK
40.0000 meq | PACK | Freq: Two times a day (BID) | ORAL | Status: DC
  Administered 2018-04-23: 40 meq via ORAL
  Filled 2018-04-23: qty 2

## 2018-04-23 MED ORDER — CEFDINIR 300 MG PO CAPS: 300.0000 mg | ORAL_CAPSULE | Freq: Two times a day (BID) | ORAL | 0 refills | Status: AC

## 2018-04-23 MED ORDER — FOLIC ACID 1 MG PO TABS: 1.0000 mg | ORAL_TABLET | Freq: Every day | ORAL | Status: AC

## 2018-04-23 MED ORDER — THIAMINE HCL 100 MG PO TABS: 100.0000 mg | ORAL_TABLET | Freq: Every day | ORAL | Status: AC

## 2018-04-23 MED ORDER — MAGNESIUM SULFATE 4 GM/100ML IV SOLN
4.0000 g | Freq: Once | INTRAVENOUS | Status: DC
  Filled 2018-04-23: qty 100

## 2018-04-23 MED ORDER — ONDANSETRON HCL 4 MG PO TABS: 4.0000 mg | ORAL_TABLET | Freq: Four times a day (QID) | ORAL | 0 refills | Status: AC | PRN

## 2018-04-23 MED ORDER — ACETAMINOPHEN 325 MG RE SUPP: 325.0000 mg | RECTAL | 0 refills | Status: AC | PRN

## 2018-04-23 MED ORDER — ADULT MULTIVITAMIN W/MINERALS CH: 1.0000 | ORAL_TABLET | Freq: Every day | ORAL | Status: AC

## 2018-04-23 MED ORDER — PANTOPRAZOLE SODIUM 40 MG PO TBEC: 40.0000 mg | DELAYED_RELEASE_TABLET | Freq: Every day | ORAL | Status: AC

## 2018-04-23 MED ORDER — MAGNESIUM SULFATE 2 GM/50ML IV SOLN
2.0000 g | Freq: Once | INTRAVENOUS | Status: AC
  Administered 2018-04-23: 2 g via INTRAVENOUS

## 2018-04-23 MED ORDER — DOFETILIDE 250 MCG PO CAPS: 250.0000 ug | ORAL_CAPSULE | ORAL | Status: AC

## 2018-04-23 MED ORDER — ORAL CARE MOUTH RINSE: 15.0000 mL | Freq: Two times a day (BID) | OROMUCOSAL | 0 refills | Status: AC

## 2018-04-23 MED ORDER — METOPROLOL SUCCINATE ER 25 MG PO TB24: 25.0000 mg | ORAL_TABLET | Freq: Every day | ORAL | Status: AC

## 2018-04-23 NOTE — Progress Notes (Addendum)
Pharmacy Electrolyte Monitoring Consult:  Pharmacy consulted to assist in monitoring and replacing electrolytes in this 68 y.o. male admitted on 04/12/2018 with Fall and Laceration   Labs:  Sodium (mmol/L)  Date Value  04/23/2018 143  07/10/2017 143   Potassium (mmol/L)  Date Value  04/23/2018 3.8   Magnesium (mg/dL)  Date Value  04/23/2018 1.7   Phosphorus (mg/dL)  Date Value  04/19/2018 2.2 (L)   Calcium (mg/dL)  Date Value  04/23/2018 8.2 (L)   Albumin (g/dL)  Date Value  04/16/2018 3.0 (L)  01/17/2016 4.6    Assessment/Plan: Potassium and magnesium are below threshold levels considering he is on dofetelide  Will order Potassium Chloride 40 mEq BID x 2 doses.  An additional order for IV magnesium sulfate 2 grams once is being added.   Will recheck levels with am labs.   Pharmacy will continue to monitor and adjust per consult.   Vallery Sa, PharmD 04/23/2018 7:04 AM

## 2018-04-23 NOTE — Progress Notes (Signed)
Updated [name], pts wife on pts discharge.

## 2018-04-23 NOTE — Discharge Instructions (Signed)
Follow-up with your doctor for staple removal in 10 days. Follow-up with primary care physician in 3 days

## 2018-04-23 NOTE — Progress Notes (Signed)
Report called to [place] at [facility]. EMS called for transport.

## 2018-04-23 NOTE — Discharge Summary (Signed)
[facility] [place] NAME: Keinan Brouillet    MR#:  [id number]  DATE OF BIRTH:  [birth year]  DATE OF ADMISSION:  04/12/2018 ADMITTING PHYSICIAN: Dustin Flock, MD  DATE OF DISCHARGE:  04/23/2018 PRIMARY CARE PHYSICIAN: No primary care provider on file.    ADMISSION DIAGNOSIS:  Hyperkalemia [E87.5] Bradycardia [R00.1] Encounter for central line placement [Z45.2] Hypothermia, initial encounter [T68.XXXA] Hypotension, unspecified hypotension type [I95.9]  DISCHARGE DIAGNOSIS:  Active Problems:   Hypotension   SECONDARY DIAGNOSIS:   Past Medical History:  Diagnosis Date  . Alcohol abuse   . Chronic systolic (congestive) heart failure ([place])    a. TTE 8/17: EF 20-25%, mild conc LVH, mild MR, mild biatrial enlarge, mild-mod TR, PASP 45, aortic root 39 mm, small posterior pericardial effusion, Afib with RVR w/ HR 120-150 bpm; b. TTE 6/18: EF 60-65%, mod LVH, mild MR, mild biatrial enlarge, mildly dilated RV, midlly reduced RVSF, mild-mod TR  . Essential hypertension   . Persistent atrial fibrillation    a. on Xarelto; b. CHADS2VASc 3 (CHF, HTN, age x 1)  . Skin cancer of face    "under left eye"    HOSPITAL COURSE:   HISTORY OF PRESENT ILLNESS: Oneal Schoenberger  is a 68 y.o. male with a known history of alcohol abuse, essential hypertension, atrial fibrillation who is presenting to the emergency room after a fall and laceration to his head.  Patient states that he was drinking earlier and lost balance and fell.  In the emergency room he was noted to be hypothermic, and hypotensive.  He received 4 L of fluid bolus blood pressure still very low.  ED physician has put a central line.  Patient otherwise is awake and alert denies any chest pain or shortness of breath.     1. Acute delirium.  Could be hospital-acquired completely resolved answering all questions appropriately todayRelative hypotension.  Hold Entresto.  Hold Cardizem.  Decrease  dose of metoprolol.  IV fluid bolus given. 2. Fever -could be from incompletely treated pneumonia Patient was on antibiotics for a few days and then antibiotics stopped.    Patient is afebrile for the past 24 hours clinically improving.   Repeat chest x-ray with persistent infiltrates.  Will discharge patient with p.o. Omnicef blood cultures and urine cultures are negative 3. Acute hypoxic respiratory failure.   This has resolved and patient is on room air.  Patient is on dysphasia diet.  4. Left patellar fracture due to mechanical fall.  Knee immobilizer. 5. Right knee hemarthrosis.  Clinically better 6. Paroxysmal atrial fibrillation holding Xarelto.  On Tikosyn.  Decrease dose of metoprolol.  Hold Cardizem.  PCP can consider resuming Xarelto if the hemoglobin remains stable during the follow-up visit 7. Alcohol abuse.  Will need to stop alcohol. 8. Chronic kidney disease stage III.  Continue to monitor. 9. Chronic systolic congestive heart failure.  Patient on Entresto, metoprolol. 10. Anemia of chronic disease.  Ferritin is elevated.  Hemoglobin up a little bit.  Continue to monitor. DISCHARGE CONDITIONS:   ok  CONSULTS OBTAINED:  Treatment Team:  Thornton Park, MD Tsosie Billing, MD   PROCEDURES   DRUG ALLERGIES:  No Known Allergies  DISCHARGE MEDICATIONS:   Allergies as of 04/23/2018   No Known Allergies     Medication List    STOP taking these medications   diltiazem 180 MG 24 hr capsule Commonly known as:  CARDIZEM CD   sacubitril-valsartan 24-26 MG Commonly known  asDelene Loll   Xarelto 20 MG Tabs tablet Generic drug:  rivaroxaban     TAKE these medications   acetaminophen 325 MG suppository Commonly known as:  TYLENOL Place 1 suppository (325 mg total) rectally every 4 (four) hours as needed for fever.   cefdinir 300 MG capsule Commonly known as:  OMNICEF Take 1 capsule (300 mg total) by mouth 2 (two) times daily.   dofetilide 250 MCG  capsule Commonly known as:  TIKOSYN Take 1 capsule (250 mcg total) by mouth daily. What changed:  See the new instructions.   folic acid 1 MG tablet Commonly known as:  FOLVITE Take 1 tablet (1 mg total) by mouth daily. Start taking on:  April 24, 2018   magnesium oxide 400 (241.3 Mg) MG tablet Commonly known as:  MAG-OX Take 1 tablet (400 mg total) by mouth 2 (two) times daily.   metoprolol succinate 25 MG 24 hr tablet Commonly known as:  TOPROL-XL Take 1 tablet (25 mg total) by mouth daily. Start taking on:  April 24, 2018 What changed:    medication strength  how much to take   mouth rinse Liqd solution 15 mLs by Mouth Rinse route 2 (two) times daily.   multivitamin with minerals Tabs tablet Take 1 tablet by mouth daily. Start taking on:  April 24, 2018   ondansetron 4 MG tablet Commonly known as:  ZOFRAN Take 1 tablet (4 mg total) by mouth every 6 (six) hours as needed for nausea.   pantoprazole 40 MG tablet Commonly known as:  PROTONIX Take 1 tablet (40 mg total) by mouth daily. Start taking on:  April 24, 2018   thiamine 100 MG tablet Take 1 tablet (100 mg total) by mouth daily. Start taking on:  April 24, 2018   traZODone 50 MG tablet Commonly known as:  DESYREL Take 1 tablet (50 mg total) by mouth at bedtime.        DISCHARGE INSTRUCTIONS:   Follow-up with primary care physician in 3 days  DIET:  CARDIAC diet  DISCHARGE CONDITION:  Fair  ACTIVITY:  Activity as tolerated  OXYGEN:  Home Oxygen: No.   Oxygen Delivery: room air  DISCHARGE LOCATION:  nursing home   If you experience worsening of your admission symptoms, develop shortness of breath, life threatening emergency, suicidal or homicidal thoughts you must seek medical attention immediately by calling 911 or calling your MD immediately  if symptoms less severe.  You Must read complete instructions/literature along with all the possible adverse reactions/side effects for all the  Medicines you take and that have been prescribed to you. Take any new Medicines after you have completely understood and accpet all the possible adverse reactions/side effects.   Please note  You were cared for by a hospitalist during your hospital stay. If you have any questions about your discharge medications or the care you received while you were in the hospital after you are discharged, you can call the unit and asked to speak with the hospitalist on call if the hospitalist that took care of you is not available. Once you are discharged, your primary care physician will handle any further medical issues. Please note that NO REFILLS for any discharge medications will be authorized once you are discharged, as it is imperative that you return to your primary care physician (or establish a relationship with a primary care physician if you do not have one) for your aftercare needs so that they can reassess your need for medications  and monitor your lab values.     Today  Chief Complaint  Patient presents with  . Fall  . Laceration   Patient is feeling better denies any complaints answering all questions appropriately and wants to be discharged to rehab center as soon as he gets insurance approval  ROS:  CONSTITUTIONAL: Denies fevers, chills. Denies any fatigue, weakness.  EYES: Denies blurry vision, double vision, eye pain. EARS, NOSE, THROAT: Denies tinnitus, ear pain, hearing loss. RESPIRATORY: Denies cough, wheeze, shortness of breath.  CARDIOVASCULAR: Denies chest pain, palpitations, edema.  GASTROINTESTINAL: Denies nausea, vomiting, diarrhea, abdominal pain. Denies bright red blood per rectum. GENITOURINARY: Denies dysuria, hematuria. ENDOCRINE: Denies nocturia or thyroid problems. HEMATOLOGIC AND LYMPHATIC: Denies easy bruising or bleeding. SKIN: Denies rash or lesion. MUSCULOSKELETAL: Denies pain in neck, back, shoulder,hips  NEUROLOGIC: Denies paralysis, paresthesias.   PSYCHIATRIC: Denies anxiety or depressive symptoms.   VITAL SIGNS:  Blood pressure 111/86, pulse (!) 101, temperature 98.8 F (37.1 C), temperature source Oral, resp. rate 19, height 6\' 1"  (1.854 m), weight 84.1 kg, SpO2 98 %.  I/O:    Intake/Output Summary (Last 24 hours) at 04/23/2018 1312 Last data filed at 04/23/2018 1219 Gross per 24 hour  Intake 164.88 ml  Output 200 ml  Net -35.12 ml    PHYSICAL EXAMINATION:  GENERAL:  68 y.o.-year-old patient lying in the bed with no acute distress.  EYES: Pupils equal, round, reactive to light and accommodation. No scleral icterus. Extraocular muscles intact.  HEENT: Head atraumatic, normocephalic. Oropharynx and nasopharynx clear.  NECK:  Supple, no jugular venous distention. No thyroid enlargement, no tenderness.  LUNGS: Trach breath sounds bilaterally, no wheezing, rales,rhonchi or crepitation. No use of accessory muscles of respiration.  CARDIOVASCULAR: S1, S2 normal. No murmurs, rubs, or gallops.  ABDOMEN: Soft, non-tender, non-distended. Bowel sounds present. No organomegaly or mass.  EXTREMITIES: No pedal edema, cyanosis, or clubbing.  Right knee with decreased range of motion and edema left ankle and right ankle with edema.  Small scab on the left knee NEUROLOGIC: Awake alert and oriented x3. Sensation intact. Gait not checked.  PSYCHIATRIC: The patient is alert and oriented x 3.  SKIN: No obvious rash, lesion, or ulcer.   DATA REVIEW:   CBC Recent Labs  Lab 04/21/18 0441  04/23/18 0402  WBC 6.3  --   --   HGB 7.8*   < > 8.2*  HCT 25.0*  --   --   PLT 150  --   --    < > = values in this interval not displayed.    Chemistries  Recent Labs  Lab 04/23/18 0402  NA 143  K 3.8  CL 111  CO2 25  GLUCOSE 116*  BUN 15  CREATININE 1.03  CALCIUM 8.2*  MG 1.7    Cardiac Enzymes No results for input(s): TROPONINI in the last 168 hours.  Microbiology Results  Results for orders placed or performed during the hospital  encounter of 04/12/18  CULTURE, BLOOD (ROUTINE X 2) w Reflex to ID Panel     Status: None   Collection Time: 04/12/18 10:44 PM  Result Value Ref Range Status   Specimen Description BLOOD RIGHT WRIST  Final   Special Requests   Final    BOTTLES DRAWN AEROBIC AND ANAEROBIC Blood Culture adequate volume   Culture   Final    NO GROWTH 5 DAYS Performed at [facility], [address], [place], [place] [address]    Report Status 04/17/2018 FINAL  Final  CULTURE, BLOOD (ROUTINE X 2) w Reflex to ID Panel     Status: None   Collection Time: 04/12/18 10:44 PM  Result Value Ref Range Status   Specimen Description BLOOD LEFT ANTECUBITAL  Final   Special Requests   Final    BOTTLES DRAWN AEROBIC AND ANAEROBIC Blood Culture adequate volume   Culture   Final    NO GROWTH 5 DAYS Performed at [facility], [address] [place], [place] [address]    Report Status 04/17/2018 FINAL  Final  MRSA PCR Screening     Status: None   Collection Time: 04/12/18 11:38 PM  Result Value Ref Range Status   MRSA by PCR NEGATIVE NEGATIVE Final    Comment:        The GeneXpert MRSA Assay (FDA approved for NASAL specimens only), is one component of a comprehensive MRSA colonization surveillance program. It is not intended to diagnose MRSA infection nor to guide or monitor treatment for MRSA infections. Performed at [facility], [address] [place], [place] [address]   CULTURE, BLOOD (ROUTINE X 2) w Reflex to ID Panel     Status: None   Collection Time: 04/16/18  1:08 PM  Result Value Ref Range Status   Specimen Description BLOOD LEFT WRIST  Final   Special Requests   Final    BOTTLES DRAWN AEROBIC AND ANAEROBIC Blood Culture adequate volume   Culture   Final    NO GROWTH 5 DAYS Performed at [facility], [address] [place], [place] [address]    Report Status 04/21/2018 FINAL  Final  CULTURE, BLOOD (ROUTINE X 2) w Reflex to ID Panel     Status: None    Collection Time: 04/16/18  1:16 PM  Result Value Ref Range Status   Specimen Description BLOOD BLOOD RIGHT HAND  Final   Special Requests   Final    BOTTLES DRAWN AEROBIC AND ANAEROBIC Blood Culture adequate volume   Culture   Final    NO GROWTH 5 DAYS Performed at [facility], [address] [place], [place] [address]    Report Status 04/21/2018 FINAL  Final  Respiratory Panel by PCR     Status: None   Collection Time: 04/16/18  1:37 PM  Result Value Ref Range Status   Adenovirus NOT DETECTED NOT DETECTED Final   Coronavirus 229E NOT DETECTED NOT DETECTED Final    Comment: (NOTE) The Coronavirus on the Respiratory Panel, DOES NOT test for the novel  Coronavirus (2019 nCoV)    Coronavirus HKU1 NOT DETECTED NOT DETECTED Final   Coronavirus NL63 NOT DETECTED NOT DETECTED Final   Coronavirus OC43 NOT DETECTED NOT DETECTED Final   Metapneumovirus NOT DETECTED NOT DETECTED Final   Rhinovirus / Enterovirus NOT DETECTED NOT DETECTED Final   Influenza A NOT DETECTED NOT DETECTED Final   Influenza B NOT DETECTED NOT DETECTED Final   Parainfluenza Virus 1 NOT DETECTED NOT DETECTED Final   Parainfluenza Virus 2 NOT DETECTED NOT DETECTED Final   Parainfluenza Virus 3 NOT DETECTED NOT DETECTED Final   Parainfluenza Virus 4 NOT DETECTED NOT DETECTED Final   Respiratory Syncytial Virus NOT DETECTED NOT DETECTED Final   Bordetella pertussis NOT DETECTED NOT DETECTED Final   Chlamydophila pneumoniae NOT DETECTED NOT DETECTED Final   Mycoplasma pneumoniae NOT DETECTED NOT DETECTED Final    Comment: Performed at [facility], [place] [address], [place],  [address]  Urine Culture  Status: None   Collection Time: 04/16/18  1:37 PM  Result Value Ref Range Status   Specimen Description   Final    URINE, RANDOM Performed at [facility], [address], [place], [place] [address]    Special Requests   Final    NONE Performed at [facility], [address] [place], [place] [address]    Culture   Final    NO GROWTH Performed at [facility], [place] [address], [place], [place] [address]    Report Status 04/17/2018 FINAL  Final  Gastrointestinal Panel by PCR , Stool     Status: None   Collection Time: 04/20/18  2:28 PM  Result Value Ref Range Status   Campylobacter species NOT DETECTED NOT DETECTED Final   Plesimonas shigelloides NOT DETECTED NOT DETECTED Final   Salmonella species NOT DETECTED NOT DETECTED Final   Yersinia enterocolitica NOT DETECTED NOT DETECTED Final   Vibrio species NOT DETECTED NOT DETECTED Final   Vibrio cholerae NOT DETECTED NOT DETECTED Final   Enteroaggregative E coli (EAEC) NOT DETECTED NOT DETECTED Final   Enteropathogenic E coli (EPEC) NOT DETECTED NOT DETECTED Final   Enterotoxigenic E coli (ETEC) NOT DETECTED NOT DETECTED Final   Shiga like toxin producing E coli (STEC) NOT DETECTED NOT DETECTED Final   Shigella/Enteroinvasive E coli (EIEC) NOT DETECTED NOT DETECTED Final   Cryptosporidium NOT DETECTED NOT DETECTED Final   Cyclospora cayetanensis NOT DETECTED NOT DETECTED Final   Entamoeba histolytica NOT DETECTED NOT DETECTED Final   Giardia lamblia NOT DETECTED NOT DETECTED Final   Adenovirus F40/41 NOT DETECTED NOT DETECTED Final   Astrovirus NOT DETECTED NOT DETECTED Final   Norovirus GI/GII NOT DETECTED NOT DETECTED Final   Rotavirus A NOT DETECTED NOT DETECTED Final   Sapovirus (I, II, IV, and V) NOT DETECTED NOT DETECTED Final    Comment: Performed at [facility], [address] [place], [place] [address]    RADIOLOGY:  Dg Chest Port 1 View  Result Date: 04/22/2018 CLINICAL DATA:  Fever EXAM: PORTABLE CHEST 1 VIEW COMPARISON:  04/17/2018 FINDINGS: Central line has been removed. Bibasilar airspace disease appears unchanged. Small left effusion. Heart size upper normal. IMPRESSION: Bibasilar infiltrates unchanged. Electronically Signed   By: Franchot Gallo M.D.   On:  04/22/2018 17:19    EKG:   Orders placed or performed during the hospital encounter of 04/12/18  . ED EKG  . ED EKG  . EKG 12-Lead  . EKG 12-Lead  . EKG 12-Lead  . EKG 12-Lead      Management plans discussed with the patient, he is  in agreement.  CODE STATUS:     Code Status Orders  (From admission, onward)         Start     Ordered   04/12/18 2231  Full code  Continuous     04/12/18 2230        Code Status History    Date Active Date Inactive Code Status Order ID Comments User Context   08/27/2017 1404 08/30/2017 1727 Full Code [id number]  Charlynn Grimes Inpatient   07/26/2017 1528 07/27/2017 1943 Full Code [id number]  Baldwin Jamaica, PA-C ED      TOTAL TIME TAKING CARE OF THIS PATIENT: 43  minutes.   Note: This dictation was prepared with Dragon dictation along with smaller phrase technology. Any transcriptional errors that result from this process are unintentional.   @[facility] @  on 04/23/2018 at  1:12 PM  Between 7am to 6pm - Pager - [phone]  After 6pm go to [email or web address] - password [email or web address] [facility]  [facility]  Office  [phone]  CC: Primary care physician; No primary care provider on file.

## 2018-04-23 NOTE — Progress Notes (Signed)
Pt left via EMS for [facility].

## 2018-04-23 NOTE — Clinical Social Work Note (Signed)
Patient discharging today to [facility]. Discharge information sent. Claiborne Billings with [facility] aware and states Josem Kaufmann is still good. Patient's nurse updated patient's wife. Shela Leff MSW,LCSW [phone]

## 2018-04-23 NOTE — Progress Notes (Signed)
OT Cancellation Note  Patient Details Name: CEDERIC MOZLEY MRN: [id number] DOB: 02/08/1951   Cancelled Treatment:    Reason Eval/Treat Not Completed: Other (comment). Pt preparing for d/c upon attempt for OT treatment.   Jeni Salles, MPH, MS, OTR/L ascom [phone] 04/23/18, 4:36 PM

## 2018-05-15 DEATH — deceased

## 2020-07-01 IMAGING — CT CT CERVICAL SPINE W/O CM
4 of 7 series · 14 of 33 positions shown, 15 images · non-contrast
Comparison: None.

CLINICAL DATA: Fall.

EXAM:
CT HEAD WITHOUT CONTRAST
CT CERVICAL SPINE WITHOUT CONTRAST
TECHNIQUE: Multidetector CT imaging of the head and cervical spine was
performed following the standard protocol without intravenous
contrast. Multiplanar CT image reconstructions of the cervical spine
were also generated.

[Series 5: c spine soft · axial · 0.37mm/px · z∈[-272,-166]mm · 4 of 89 slices shown]
[im 18/89  soft-tissue]
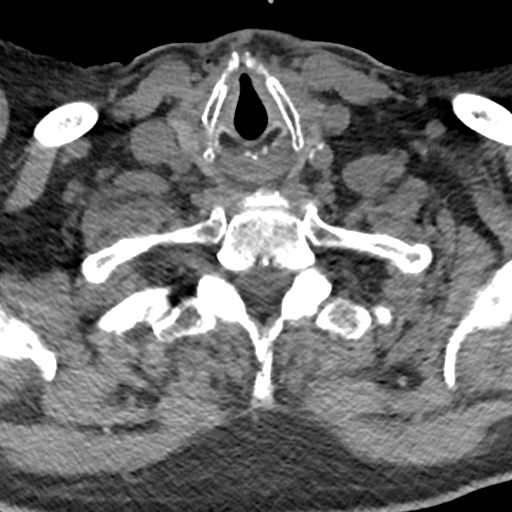
[im 36/89  soft-tissue]
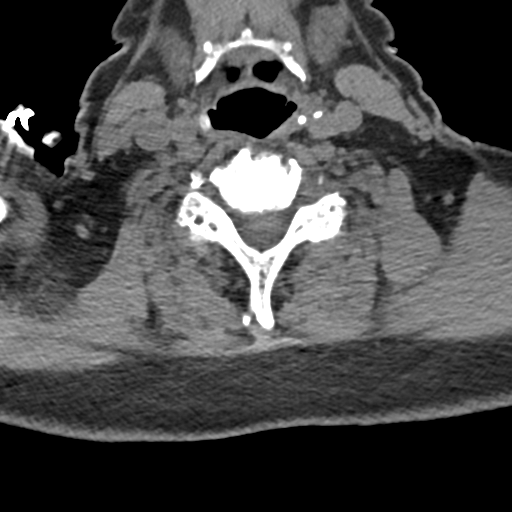
[im 53/89  soft-tissue]
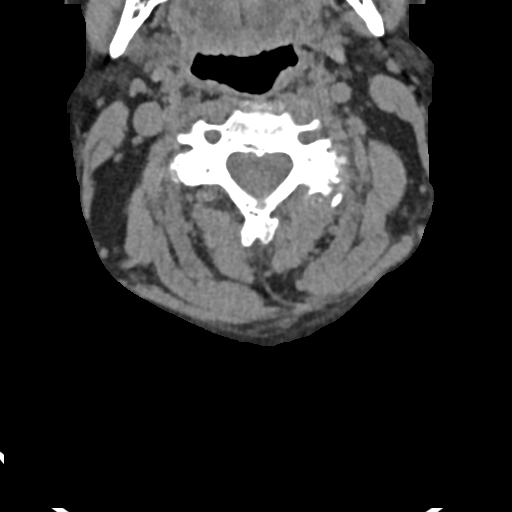
[im 71/89  soft-tissue]
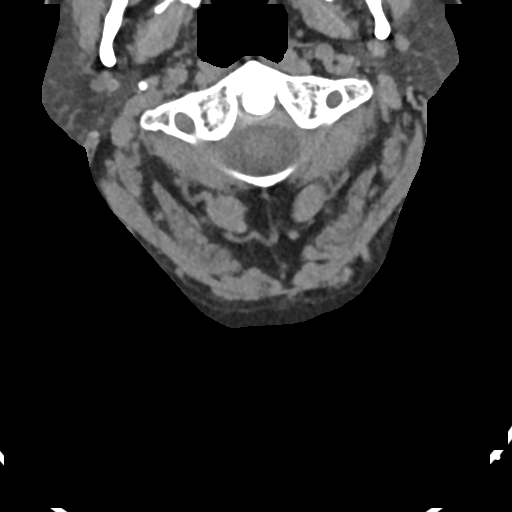

[Series 8: sagittal bone · sagittal · 0.30mm/px · 5 of 79 slices shown]
[im 12/79  bone]
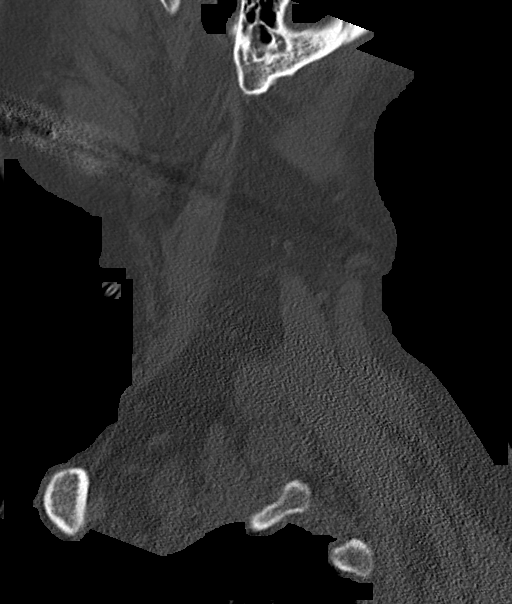
[im 23/79  bone]
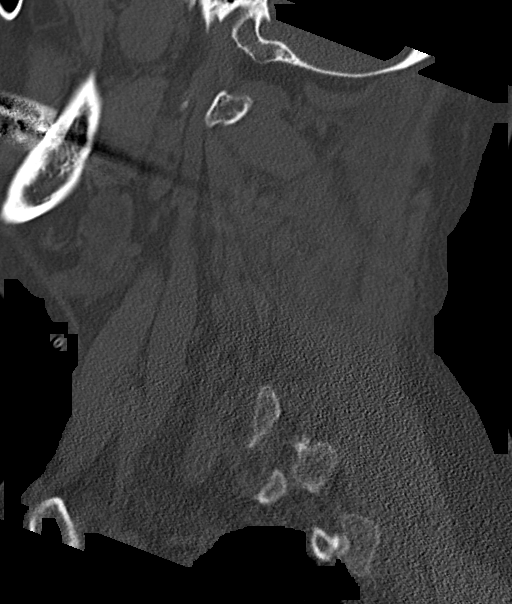
[im 34/79  bone]
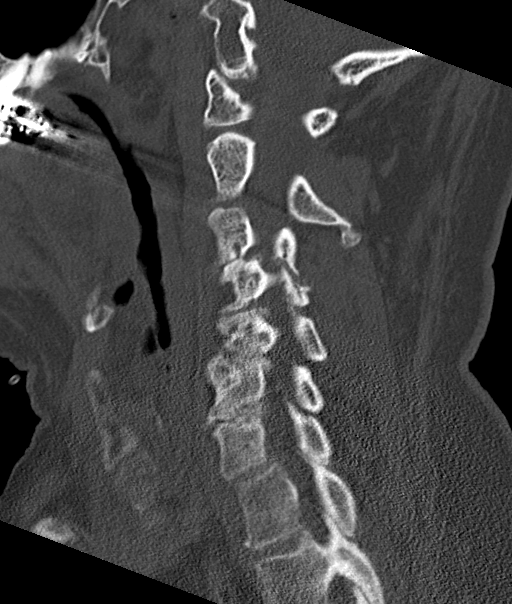
[im 45/79  bone]
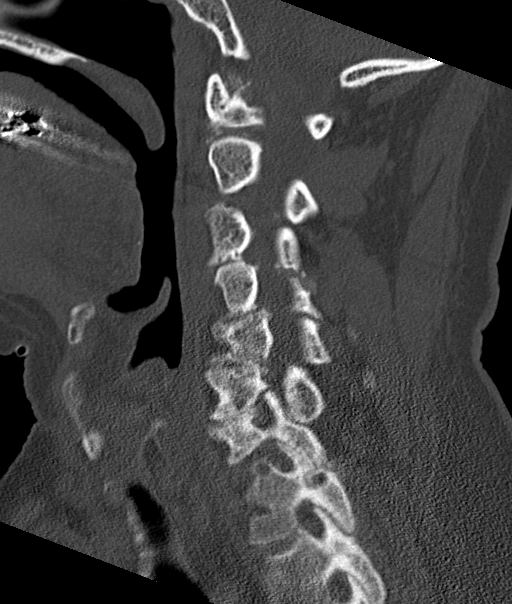
[im 56/79  bone]
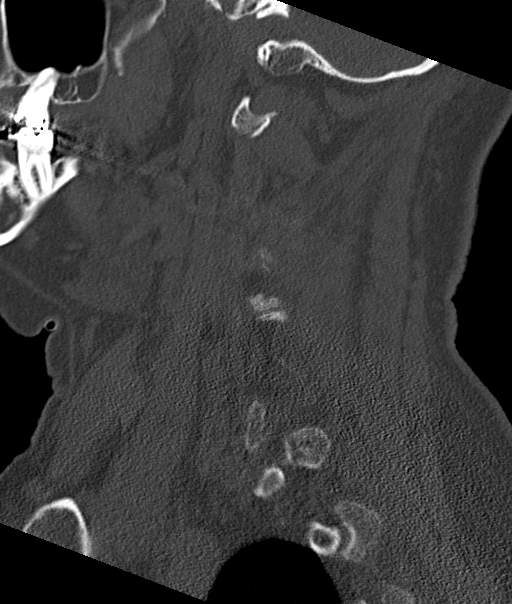

[Series 9: coronal bone · coronal · 0.30mm/px · 1 of 78 slices shown]
[im 39/78  bone]
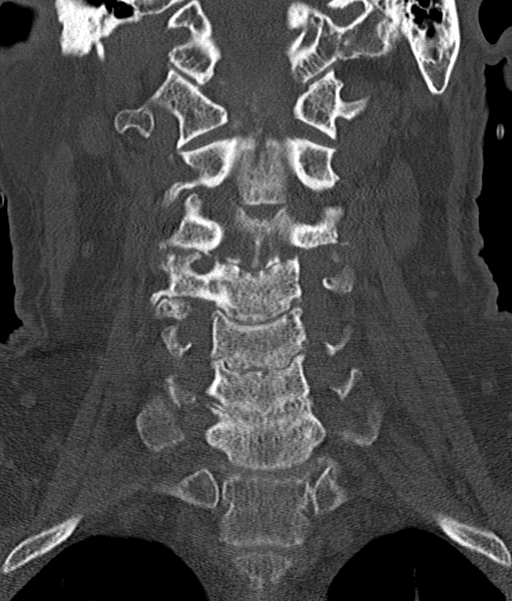

[Series 10: orthogonal bone · axial · 0.30mm/px · z∈[-295,-194]mm · 4 of 92 slices shown, 5 images]
[im 19/92  soft-tissue]
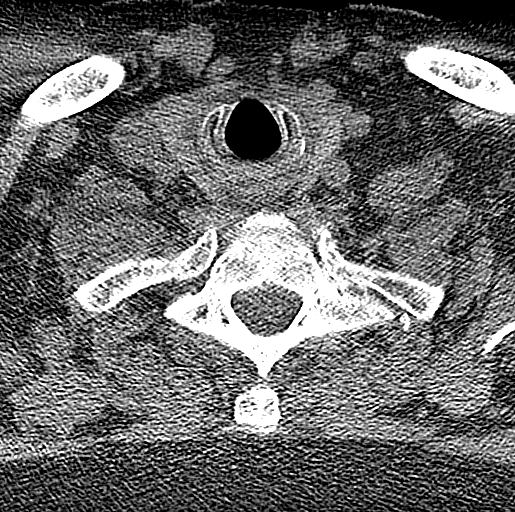
[im 19/92  bone]
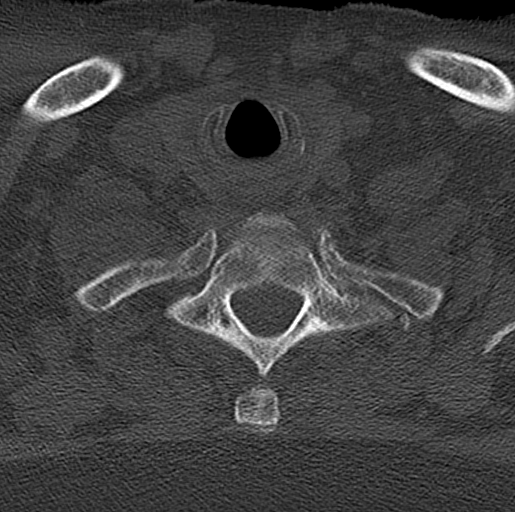
[im 37/92  bone]
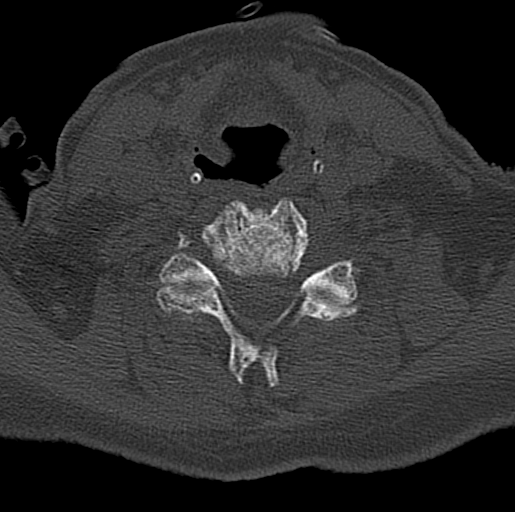
[im 55/92  bone]
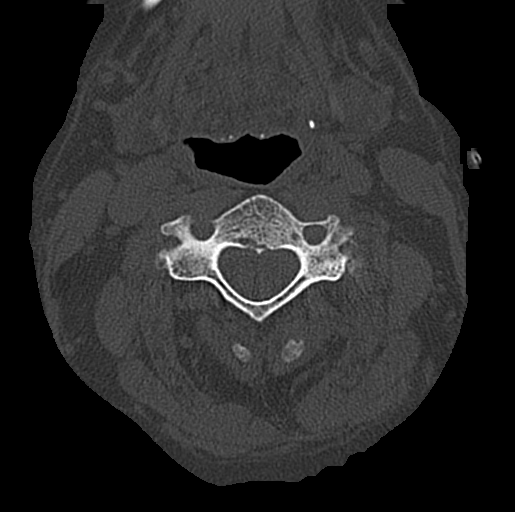
[im 73/92  bone]
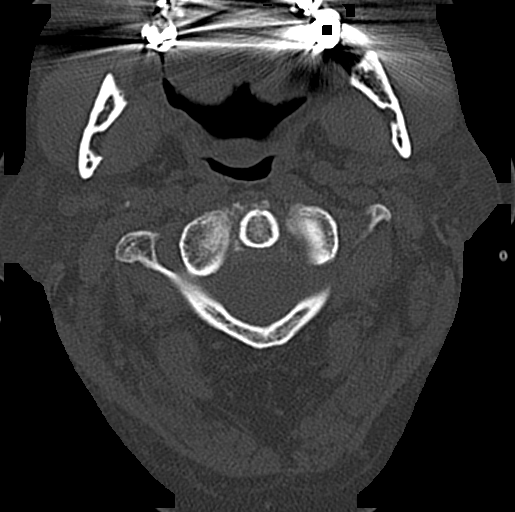

[14 of 33 positions shown; findings below may reference images not displayed]

FINDINGS: CT HEAD FINDINGS

Brain: There is no evidence of acute infarct, intracranial
hemorrhage, mass, midline shift, or extra-axial fluid collection.
There is mild cerebral atrophy. Cerebral white matter hypodensities
are nonspecific but compatible with mild chronic small vessel
ischemic disease.

Vascular: Calcified atherosclerosis at the skull base. No hyperdense
vessel.

Skull: No fracture or focal osseous lesion.

Sinuses/Orbits: Visualized paranasal sinuses are clear. Trace right
mastoid effusion. Unremarkable orbits.

Other: Small left lateral parietal scalp hematoma with foci of gas
consistent with laceration.

CT CERVICAL SPINE FINDINGS

Alignment: Reversal of the cervical lordosis. Grade 1
anterolisthesis of C3 on C4, C4 on C5, and C7 on T1. Grade 1
retrolisthesis of C5 on C6.

Skull base and vertebrae: No acute fracture. Moderate C1-2
arthropathy with cystic changes in the base of the dens.

Soft tissues and spinal canal: No prevertebral fluid or swelling. No
visible canal hematoma.

Disc levels: Advanced disc degeneration from C[DATE]-C6-7 with disc
space narrowing most severe at C5-6 and C6-7. Severe facet arthrosis
bilaterally at C3-4 and C7-T1 and on the right at C4-5. Erosive
facet changes at C3-4. Moderate multilevel neural foraminal
stenosis. No osseous spinal canal stenosis.

Upper chest: Clear lung apices.

Other: None.
IMPRESSION: 1. No evidence of acute intracranial abnormality.
2. Mild chronic small vessel ischemic disease and cerebral atrophy.
3. Left parietal scalp hematoma.
4. No acute cervical spine fracture. Advanced disc and facet
degeneration.
